# Patient Record
Sex: Female | Born: 1960
Health system: Southern US, Community
[De-identification: ages and names within clinical notes are randomized; demographics above are authoritative.]

## PROBLEM LIST (undated history)

## (undated) ENCOUNTER — Emergency Department (HOSPITAL_COMMUNITY): Admission: EM | Payer: Commercial Managed Care - HMO | Source: Home / Self Care

## (undated) DIAGNOSIS — E785 Hyperlipidemia, unspecified: Secondary | ICD-10-CM

## (undated) DIAGNOSIS — R42 Dizziness and giddiness: Secondary | ICD-10-CM

## (undated) DIAGNOSIS — D126 Benign neoplasm of colon, unspecified: Secondary | ICD-10-CM

## (undated) DIAGNOSIS — M1990 Unspecified osteoarthritis, unspecified site: Secondary | ICD-10-CM

## (undated) HISTORY — DX: Benign neoplasm of colon, unspecified: D12.6

## (undated) HISTORY — PX: OTHER SURGICAL HISTORY: SHX169

## (undated) HISTORY — PX: BREAST BIOPSY: SHX20

## (undated) HISTORY — PX: TUBAL LIGATION: SHX77

## (undated) HISTORY — DX: Hyperlipidemia, unspecified: E78.5

## (undated) HISTORY — PX: APPENDECTOMY: SHX54

## (undated) HISTORY — DX: Dizziness and giddiness: R42

## (undated) HISTORY — DX: Unspecified osteoarthritis, unspecified site: M19.90

---

## 1999-06-16 ENCOUNTER — Other Ambulatory Visit: Admission: RE | Admit: 1999-06-16 | Discharge: 1999-06-16 | Payer: Self-pay | Admitting: Obstetrics & Gynecology

## 1999-06-22 ENCOUNTER — Encounter: Payer: Self-pay | Admitting: Obstetrics & Gynecology

## 1999-06-22 ENCOUNTER — Encounter: Admission: RE | Admit: 1999-06-22 | Discharge: 1999-06-22 | Payer: Self-pay | Admitting: Obstetrics & Gynecology

## 1999-06-30 ENCOUNTER — Encounter: Admission: RE | Admit: 1999-06-30 | Discharge: 1999-06-30 | Payer: Self-pay | Admitting: Obstetrics & Gynecology

## 1999-06-30 ENCOUNTER — Encounter: Payer: Self-pay | Admitting: Obstetrics & Gynecology

## 2000-01-03 ENCOUNTER — Encounter: Admission: RE | Admit: 2000-01-03 | Discharge: 2000-01-03 | Payer: Self-pay | Admitting: Obstetrics & Gynecology

## 2000-01-03 ENCOUNTER — Encounter: Payer: Self-pay | Admitting: Obstetrics & Gynecology

## 2000-06-19 ENCOUNTER — Other Ambulatory Visit: Admission: RE | Admit: 2000-06-19 | Discharge: 2000-06-19 | Payer: Self-pay | Admitting: Obstetrics & Gynecology

## 2000-06-27 ENCOUNTER — Encounter: Payer: Self-pay | Admitting: Obstetrics & Gynecology

## 2000-06-27 ENCOUNTER — Encounter: Admission: RE | Admit: 2000-06-27 | Discharge: 2000-06-27 | Payer: Self-pay | Admitting: Obstetrics & Gynecology

## 2000-11-06 ENCOUNTER — Ambulatory Visit (HOSPITAL_COMMUNITY): Admission: RE | Admit: 2000-11-06 | Discharge: 2000-11-06 | Payer: Self-pay | Admitting: Internal Medicine

## 2000-11-06 ENCOUNTER — Encounter: Payer: Self-pay | Admitting: Internal Medicine

## 2001-06-25 ENCOUNTER — Other Ambulatory Visit: Admission: RE | Admit: 2001-06-25 | Discharge: 2001-06-25 | Payer: Self-pay | Admitting: Obstetrics & Gynecology

## 2001-06-28 ENCOUNTER — Encounter: Payer: Self-pay | Admitting: Obstetrics & Gynecology

## 2001-06-28 ENCOUNTER — Encounter: Admission: RE | Admit: 2001-06-28 | Discharge: 2001-06-28 | Payer: Self-pay | Admitting: Obstetrics & Gynecology

## 2002-06-27 ENCOUNTER — Other Ambulatory Visit: Admission: RE | Admit: 2002-06-27 | Discharge: 2002-06-27 | Payer: Self-pay | Admitting: Obstetrics & Gynecology

## 2002-07-09 ENCOUNTER — Encounter: Admission: RE | Admit: 2002-07-09 | Discharge: 2002-07-09 | Payer: Self-pay | Admitting: Obstetrics & Gynecology

## 2002-07-09 ENCOUNTER — Encounter: Payer: Self-pay | Admitting: Obstetrics & Gynecology

## 2003-06-24 ENCOUNTER — Other Ambulatory Visit: Admission: RE | Admit: 2003-06-24 | Discharge: 2003-06-24 | Payer: Self-pay | Admitting: Obstetrics & Gynecology

## 2003-07-21 ENCOUNTER — Encounter: Admission: RE | Admit: 2003-07-21 | Discharge: 2003-07-21 | Payer: Self-pay | Admitting: Obstetrics & Gynecology

## 2004-08-12 ENCOUNTER — Encounter: Admission: RE | Admit: 2004-08-12 | Discharge: 2004-08-12 | Payer: Self-pay | Admitting: Obstetrics & Gynecology

## 2005-03-21 ENCOUNTER — Ambulatory Visit: Payer: Self-pay | Admitting: Internal Medicine

## 2005-09-06 ENCOUNTER — Encounter: Admission: RE | Admit: 2005-09-06 | Discharge: 2005-09-06 | Payer: Self-pay | Admitting: Obstetrics & Gynecology

## 2005-09-20 ENCOUNTER — Encounter: Admission: RE | Admit: 2005-09-20 | Discharge: 2005-09-20 | Payer: Self-pay | Admitting: Obstetrics & Gynecology

## 2005-09-20 ENCOUNTER — Ambulatory Visit: Payer: Self-pay | Admitting: Internal Medicine

## 2005-09-28 ENCOUNTER — Encounter (INDEPENDENT_AMBULATORY_CARE_PROVIDER_SITE_OTHER): Payer: Self-pay | Admitting: Specialist

## 2005-09-28 ENCOUNTER — Encounter: Admission: RE | Admit: 2005-09-28 | Discharge: 2005-09-28 | Payer: Self-pay | Admitting: Obstetrics & Gynecology

## 2005-10-25 ENCOUNTER — Ambulatory Visit: Payer: Self-pay | Admitting: Internal Medicine

## 2005-11-16 ENCOUNTER — Encounter: Admission: RE | Admit: 2005-11-16 | Discharge: 2005-11-16 | Payer: Self-pay | Admitting: General Surgery

## 2005-11-16 ENCOUNTER — Ambulatory Visit (HOSPITAL_BASED_OUTPATIENT_CLINIC_OR_DEPARTMENT_OTHER): Admission: RE | Admit: 2005-11-16 | Discharge: 2005-11-16 | Payer: Self-pay | Admitting: General Surgery

## 2005-11-16 ENCOUNTER — Encounter (INDEPENDENT_AMBULATORY_CARE_PROVIDER_SITE_OTHER): Payer: Self-pay | Admitting: *Deleted

## 2006-09-11 ENCOUNTER — Encounter: Admission: RE | Admit: 2006-09-11 | Discharge: 2006-09-11 | Payer: Self-pay | Admitting: Obstetrics & Gynecology

## 2006-09-13 ENCOUNTER — Encounter: Admission: RE | Admit: 2006-09-13 | Discharge: 2006-09-13 | Payer: Self-pay | Admitting: Obstetrics & Gynecology

## 2007-09-27 ENCOUNTER — Encounter: Admission: RE | Admit: 2007-09-27 | Discharge: 2007-09-27 | Payer: Self-pay | Admitting: Obstetrics & Gynecology

## 2007-11-16 ENCOUNTER — Ambulatory Visit: Payer: Self-pay | Admitting: Internal Medicine

## 2007-12-06 ENCOUNTER — Ambulatory Visit: Payer: Self-pay | Admitting: Internal Medicine

## 2007-12-10 ENCOUNTER — Telehealth (INDEPENDENT_AMBULATORY_CARE_PROVIDER_SITE_OTHER): Payer: Self-pay | Admitting: *Deleted

## 2007-12-24 ENCOUNTER — Telehealth (INDEPENDENT_AMBULATORY_CARE_PROVIDER_SITE_OTHER): Payer: Self-pay | Admitting: *Deleted

## 2008-07-25 ENCOUNTER — Encounter: Admission: RE | Admit: 2008-07-25 | Discharge: 2008-07-25 | Payer: Self-pay | Admitting: Obstetrics & Gynecology

## 2008-10-10 ENCOUNTER — Encounter: Admission: RE | Admit: 2008-10-10 | Discharge: 2008-10-10 | Payer: Self-pay | Admitting: Obstetrics & Gynecology

## 2009-03-12 ENCOUNTER — Ambulatory Visit: Payer: Self-pay | Admitting: Internal Medicine

## 2009-03-12 DIAGNOSIS — E785 Hyperlipidemia, unspecified: Secondary | ICD-10-CM | POA: Insufficient documentation

## 2009-03-12 DIAGNOSIS — J309 Allergic rhinitis, unspecified: Secondary | ICD-10-CM | POA: Insufficient documentation

## 2009-03-16 ENCOUNTER — Encounter (INDEPENDENT_AMBULATORY_CARE_PROVIDER_SITE_OTHER): Payer: Self-pay | Admitting: *Deleted

## 2009-03-17 ENCOUNTER — Encounter: Payer: Self-pay | Admitting: Internal Medicine

## 2009-03-18 ENCOUNTER — Ambulatory Visit: Payer: Self-pay | Admitting: Gastroenterology

## 2009-03-30 ENCOUNTER — Ambulatory Visit: Payer: Self-pay | Admitting: Gastroenterology

## 2009-03-30 DIAGNOSIS — D126 Benign neoplasm of colon, unspecified: Secondary | ICD-10-CM

## 2009-03-30 HISTORY — DX: Benign neoplasm of colon, unspecified: D12.6

## 2009-03-30 HISTORY — PX: OTHER SURGICAL HISTORY: SHX169

## 2009-04-03 ENCOUNTER — Encounter: Payer: Self-pay | Admitting: Gastroenterology

## 2009-10-14 ENCOUNTER — Encounter: Admission: RE | Admit: 2009-10-14 | Discharge: 2009-10-14 | Payer: Self-pay | Admitting: Obstetrics & Gynecology

## 2010-02-16 ENCOUNTER — Telehealth (INDEPENDENT_AMBULATORY_CARE_PROVIDER_SITE_OTHER): Payer: Self-pay | Admitting: *Deleted

## 2010-03-21 ENCOUNTER — Encounter: Payer: Self-pay | Admitting: Obstetrics & Gynecology

## 2010-03-28 LAB — CONVERTED CEMR LAB
AST: 19 units/L (ref 0–37)
Albumin: 4 g/dL (ref 3.5–5.2)
Alkaline Phosphatase: 18 units/L — ABNORMAL LOW (ref 39–117)
Basophils Absolute: 0 10*3/uL (ref 0.0–0.1)
Bilirubin, Direct: 0.1 mg/dL (ref 0.0–0.3)
Cholesterol, target level: 200 mg/dL
Glucose, Bld: 94 mg/dL (ref 70–99)
Hemoglobin: 12.5 g/dL (ref 12.0–15.0)
Lymphocytes Relative: 28.8 % (ref 12.0–46.0)
MCV: 94 fL (ref 78.0–100.0)
Neutrophils Relative %: 58 % (ref 43.0–77.0)
RBC: 4.03 M/uL (ref 3.87–5.11)
Sodium: 136 meq/L (ref 135–145)
TSH: 2.49 microintl units/mL (ref 0.35–5.50)
Total Bilirubin: 0.7 mg/dL (ref 0.3–1.2)
Total Protein: 6.8 g/dL (ref 6.0–8.3)
WBC: 4 10*3/uL — ABNORMAL LOW (ref 4.5–10.5)

## 2010-03-30 NOTE — Letter (Signed)
Summary: Results Letter  [facility]  [address] [place], [place] [address]   Phone: [phone]  Fax: [phone]        April 03, 2009 MRN: [id number]    Jean Pittman [address] [place], [place]  [address]    Dear Ms. Fadeley,   At least one of the polyps removed during your recent procedure was proven to be adenomatous.  These are pre-cancerous polyps that may have grown into cancers if they had not been removed.  Based on current nationally recognized surveillance guidelines, I recommend that you have a repeat colonoscopy in 5 years.  We will therefore put your information in our reminder system and will contact you in 5 years to schedule a repeat procedure.  Please call if you have any questions or concerns.       Sincerely,  Rachael Fee MD  This letter has been electronically signed by your physician.  Appended Document: Results Letter Letter mailed 2.8.11

## 2010-03-30 NOTE — Letter (Signed)
Summary: Results Follow up Letter  Jean Pittman at [place]  [address] [place], [place] [address]   Phone: [phone]  Fax: [phone]    03/16/2009 MRN: [id number]  [facility] [address] [place], [place]  [address]  Dear Jean Pittman,  The following are the results of your recent test(s):  Test         Result    Pap Smear:        Normal _____  Not Normal _____ Comments: ______________________________________________________ Cholesterol: LDL(Bad cholesterol):         Your goal is less than:         HDL (Good cholesterol):       Your goal is more than: Comments:  ______________________________________________________ Mammogram:        Normal _____  Not Normal _____ Comments:  ___________________________________________________________________ Hemoccult:        Normal _____  Not normal _______ Comments:    _____________________________________________________________________ Other Tests:Please see attached labs.    We routinely do not discuss normal results over the telephone.  If you desire a copy of the results, or you have any questions about this information we can discuss them at your next office visit.   Sincerely,    Felecia, CMA

## 2010-03-30 NOTE — Letter (Signed)
Summary: [facility] Instructions  [facility]  [address] [place], [place] [address]   Phone: [phone]  Fax: [phone]       Jean Pittman    1961/02/21    MRN: [id number]        Procedure Day /Date:  Monday 03/02/2009     Arrival Time:  10:00 am      Procedure Time:  11:00 am     Location of Procedure:                    _x _  [facility] (4th Floor)                        PREPARATION FOR COLONOSCOPY WITH MOVIPREP   Starting 5 days prior to your procedure Wednesday 1/26 do not eat nuts, seeds, popcorn, corn, beans, peas,  salads, or any raw vegetables.  Do not take any fiber supplements (e.g. Metamucil, Citrucel, and Benefiber).  THE DAY BEFORE YOUR PROCEDURE         DATE: Sunday 1/30  1.  Drink clear liquids the entire day-NO SOLID FOOD  2.  Do not drink anything colored red or purple.  Avoid juices with pulp.  No orange juice.  3.  Drink at least 64 oz. (8 glasses) of fluid/clear liquids during the day to prevent dehydration and help the prep work efficiently.  CLEAR LIQUIDS INCLUDE: Water Jello Ice Popsicles Tea (sugar ok, no milk/cream) Powdered fruit flavored drinks Coffee (sugar ok, no milk/cream) Gatorade Juice: apple, white grape, white cranberry  Lemonade Clear bullion, consomm, broth Carbonated beverages (any kind) Strained chicken noodle soup Hard Candy                             4.  In the morning, mix first dose of MoviPrep solution:    Empty 1 Pouch A and 1 Pouch B into the disposable container    Add lukewarm drinking water to the top line of the container. Mix to dissolve    Refrigerate (mixed solution should be used within 24 hrs)  5.  Begin drinking the prep at 5:00 p.m. The MoviPrep container is divided by 4 marks.   Every 15 minutes drink the solution down to the next mark (approximately 8 oz) until the full liter is complete.   6.  Follow completed prep with 16 oz of clear liquid of your choice  (Nothing red or purple).  Continue to drink clear liquids until bedtime.  7.  Before going to bed, mix second dose of MoviPrep solution:    Empty 1 Pouch A and 1 Pouch B into the disposable container    Add lukewarm drinking water to the top line of the container. Mix to dissolve    Refrigerate  THE DAY OF YOUR PROCEDURE      DATE: Monday 1/31  Beginning at 6:00 am (5 hours before procedure):         1. Every 15 minutes, drink the solution down to the next mark (approx 8 oz) until the full liter is complete.  2. Follow completed prep with 16 oz. of clear liquid of your choice.    3. You may drink clear liquids until 9:00 am (2 HOURS BEFORE PROCEDURE).   MEDICATION INSTRUCTIONS  Unless otherwise instructed, you should take regular prescription medications with a small sip of water   as early as possible the  morning of your procedure.          OTHER INSTRUCTIONS  You will need a responsible adult at least 51 years of age to accompany you and drive you home.   This person must remain in the waiting room during your procedure.  Wear loose fitting clothing that is easily removed.  Leave jewelry and other valuables at home.  However, you may wish to bring a book to read or  an iPod/MP3 player to listen to music as you wait for your procedure to start.  Remove all body piercing jewelry and leave at home.  Total time from sign-in until discharge is approximately 2-3 hours.  You should go home directly after your procedure and rest.  You can resume normal activities the  day after your procedure.  The day of your procedure you should not:   Drive   Make legal decisions   Operate machinery   Drink alcohol   Return to work  You will receive specific instructions about eating, activities and medications before you leave.    The above instructions have been reviewed and explained to me by   Ezra Sites RN  March 18, 2009 10:51 AM     I fully understand and  can verbalize these instructions _____________________________ Date _________

## 2010-03-30 NOTE — Procedures (Signed)
Summary: Colonoscopy  Patient: [name] Note: All result statuses are Final unless otherwise noted.  Tests: (1) Colonoscopy (COL)   COL Colonoscopy           DONE     [facility]     [address]     [place], [place]  [address]           COLONOSCOPY PROCEDURE REPORT           PATIENT:  Jean, Pittman  MR#:  [id number]     BIRTHDATE:  1960-07-05, 49 yrs. old  GENDER:  female           ENDOSCOPIST:  Rachael Fee, MD     Referred by:  Marga Melnick, M.D.           PROCEDURE DATE:  03/30/2009     PROCEDURE:  Colonoscopy with snare polypectomy     ASA CLASS:  Class II     INDICATIONS:  Routine Risk Screening           MEDICATIONS:   Fentanyl 100 mcg IV, Versed 10 mg IV           DESCRIPTION OF PROCEDURE:   After the risks benefits and     alternatives of the procedure were thoroughly explained, informed     consent was obtained.  Digital rectal exam was performed and     revealed no rectal masses.   The LB CF-[id number] endoscope     was introduced through the anus and advanced to the cecum, which     was identified by both the appendix and ileocecal valve, without     limitations.  The quality of the prep was good, using MoviPrep.     The instrument was then slowly withdrawn as the colon was fully     examined.     <<PROCEDUREIMAGES>>           FINDINGS:  A diminutive polyp was found in the proximal transverse     colon. This was 2-73mm across, removed with cold snare and sent to     pathology (ajr 1) (see image5 and image6).  Internal and external     hemorrhoids were found. These were small.  This was otherwise a     normal examination of the colon (see image3, image2, and image7).     Retroflexed views in the rectum revealed no abnormalities.    The     scope was then withdrawn from the patient and the procedure     completed.           COMPLICATIONS:  None           ENDOSCOPIC IMPRESSION:     1) Diminutive polyp in the proximal transverse colon,  removed     and sent to pathology     2) Small internal and external hemorrhoids     3) Otherwise normal examination           RECOMMENDATIONS:     1) If the polyp(s) removed today are proven to be adenomatous     (pre-cancerous) polyps, you will need a repeat colonoscopy in 5     years. Otherwise you should continue to follow colorectal cancer     screening guidelines for "routine risk" patients with colonoscopy     in 10 years.     2) You will receive a letter within 1-2 weeks with the results     of your biopsy as well  as final recommendations. Please call my     office if you have not received a letter after 3 weeks.           REPEAT EXAM:  await pathology           ______________________________     Rachael Fee, MD           CC:           n.     eSIGNED:   Rachael Fee at 03/30/2009 11:13 AM           Romualdo Bolk, [id number]  Note: An exclamation mark (!) indicates a result that was not dispersed into the flowsheet. Document Creation Date: 03/30/2009 11:14 AM _______________________________________________________________________  (1) Order result status: Final Collection or observation date-time: 03/30/2009 11:10 Requested date-time:  Receipt date-time:  Reported date-time:  Referring Physician:   Ordering Physician: Rob Bunting [phone]) Specimen Source:  Source: Launa Grill Order Number: [id number] Lab site:   Appended Document: Colonoscopy recall     Procedures Next Due Date:    Colonoscopy: 02/2014

## 2010-03-30 NOTE — Assessment & Plan Note (Signed)
Summary: PT NEEDS BLOODWORK-DECLINES CPX/NTA   Vital Signs:  Patient profile:   50 year old female Height:      66.75 inches Weight:      146.2 pounds BMI:     23.15 Pulse rate:   56 / minute Resp:     12 per minute BP sitting:   118 / 70  (left arm) Cuff size:   regular  Vitals Entered By: Shonna Chock (March 12, 2009 9:06 AM)  Comments No current meds   History of Present Illness: Jean Pittman requests fasting labs; her lipids were borderline elevated while on OsteoBiflex in 2007. She is asymptomatic.  Lipid Management History:      Negative NCEP/ATP III risk factors include female age less than 75 years old, no history of early menopause without estrogen hormone replacement, non-diabetic, no family history for ischemic heart disease, non-tobacco-user status, non-hypertensive, no ASHD (atherosclerotic heart disease), no prior stroke/TIA, no peripheral vascular disease, and no history of aortic aneurysm.     Preventive Screening-Counseling & Management  Alcohol-Tobacco     Smoking Status: never  Caffeine-Diet-Exercise     Does Patient Exercise: no  Allergies (verified): No Known Drug Allergies  Past History:  Past Medical History: 8/07 - abnormal mammogram, (neg bx) Shoulder Impingement 2010, S/P steroid injection & PT Hyperlipidemia , borderline on Glucosamine  Past Surgical History: Appendectomy (1914) Caesarean section (7829) Caesarean section (5621) Caesarean section (3086) Tubal ligation (1987); G 3 P 3 Breast biopsy 2007 ; No colonoscopy to date ( SOC reviewed)  Family History: no colon CA, CAD,CVA, breast CA DM, CA ? primary  - P aunt HTN - F Alzheimer's  - P aunt Thyroid disease -M  Social History: Married [place]  Never Smoked Alcohol use-yes: rare Regular exercise-no Does Patient Exercise:  no  Review of Systems  The patient denies fever, weight loss, weight gain, vision loss, decreased hearing, hoarseness, chest pain,  syncope, dyspnea on exertion, peripheral edema, headaches, hematuria, incontinence, suspicious skin lesions, depression, unusual weight change, abnormal bleeding, enlarged lymph nodes, and angioedema.         Minor appetite change with URI ENT:  Complains of sinus pressure; denies nasal congestion; L maxillary pressure.No frontal headache or purulence.OTC Zyrtec D as needed . Resp:  Denies cough, shortness of breath, sputum productive, and wheezing. GI:  Denies abdominal pain, bloody stools, dark tarry stools, and indigestion; Align as needed for constipation. MS:  Complains of joint pain; denies joint redness, joint swelling, low back pain, mid back pain, and thoracic pain. Allergy:  Denies itching eyes, seasonal allergies, and sneezing.  Physical Exam  General:  Thin but well-nourished,in no acute distress; alert,appropriate and cooperative throughout examination Head:  Normocephalic and atraumatic without obvious abnormalities.  Eyes:  No corneal or conjunctival inflammation noted.Perrla. Funduscopic exam benign, without hemorrhages, exudates or papilledema.  Ears:  External ear exam shows no significant lesions or deformities.  Otoscopic examination reveals clear canals, tympanic membranes are intact bilaterally without bulging, retraction, inflammation or discharge. Hearing is grossly normal bilaterally. Nose:  External nasal examination shows no deformity or inflammation. Nasal mucosa are pink and moist without lesions or exudates. Slightly decreased orifice on L Mouth:  Oral mucosa and oropharynx without lesions or exudates.  Teeth in good repair. Neck:  No deformities, masses, or tenderness noted. Lungs:  Normal respiratory effort, chest expands symmetrically. Lungs are clear to auscultation, no crackles or wheezes. Heart:  Normal rate and regular rhythm. S1 and S2 normal without gallop, murmur, click,  rub . Soft S4  Abdomen:  Bowel sounds positive,abdomen soft and non-tender without  masses, organomegaly or hernias noted. Genitalia:  Dr Seymour Bars Pulses:  R and L carotid,radial,dorsalis pedis and posterior tibial pulses are full and equal bilaterally Extremities:  No clubbing, cyanosis, edema, or deformity noted with normal full range of motion of all joints.   Neurologic:  alert & oriented X3 and DTRs symmetrical and normal.   Skin:  Intact without suspicious lesions or rashes Cervical Nodes:  No lymphadenopathy noted Axillary Nodes:  No palpable lymphadenopathy Psych:  memory intact for recent and remote, normally interactive, and good eye contact.     Impression & Recommendations:  Problem # 1:  PREVENTIVE HEALTH CARE (ICD-V70.0)  Orders: Venipuncture (59563) TLB-BMP (Basic Metabolic Panel-BMET) (80048-METABOL) TLB-CBC Platelet - w/Differential (85025-CBCD) TLB-Hepatic/Liver Function Pnl (80076-HEPATIC) TLB-TSH (Thyroid Stimulating Hormone) (84443-TSH) T-NMR, Lipoprofile (87564-33295) EKG w/ Interpretation (93000) Gastroenterology Referral (GI)  Problem # 2:  RHINITIS (ICD-477.9)  L maxillary pressure  Her updated medication list for this problem includes:    Fluticasone Propionate 50 Mcg/act Susp (Fluticasone propionate) .Marland Kitchen... 1 spray two times a day as needed for sinus pressure  Problem # 3:  HYPERLIPIDEMIA (ICD-272.4)  borderline elevation on Glucosamine  Orders: Venipuncture (18841) T-NMR, Lipoprofile (66063-01601) EKG w/ Interpretation (93000)  Problem # 4:  SCREENING, COLON CANCER (ICD-V76.51)  Orders: Gastroenterology Referral (GI)  Complete Medication List: 1)  Fluticasone Propionate 50 Mcg/act Susp (Fluticasone propionate) .Marland Kitchen.. 1 spray two times a day as needed for sinus pressure  Lipid Assessment/Plan:      Based on NCEP/ATP III, the patient's risk factor category is "0-1 risk factors".  The patient's lipid goals are as follows: Total cholesterol goal is 200; LDL cholesterol goal is 160; HDL cholesterol goal is 40; Triglyceride goal is  150.  Her LDL cholesterol goal has been met.    Patient Instructions: 1)  Neti pot once daily as needed for sinus pressure. Prescriptions: FLUTICASONE PROPIONATE 50 MCG/ACT SUSP (FLUTICASONE PROPIONATE) 1 spray two times a day as needed for sinus pressure  #1 x 11   Entered and Authorized by:   Marga Melnick MD   Signed by:   Marga Melnick MD on 03/12/2009   Method used:   Print then Give to Patient   RxID:   [id number]

## 2010-03-30 NOTE — Miscellaneous (Signed)
Summary: [facility]  Clinical Lists Changes  Observations: Added new observation of NKA: T (03/18/2009 10:16)  Pt's pharmacy ([facility]) is not in EMR.  Rx for Moviprep called in.

## 2010-04-01 NOTE — Progress Notes (Signed)
Summary: labs orders for April 18, 2010--added orders  Phone Note Call from Patient   Caller: Patient Summary of Call: has CPX scheduled for 04/16/2010----has labs for 04-18-2010     what codes and diag do you want??    thanks Initial call taken by: Jerolyn Shin,  February 16, 2010 9:28 AM  Follow-up for Phone Call        Lipid,Hepatic,BMP,CBCD,TSH,Stool cards v70.0/272.4 Follow-up by: Shonna Chock CMA,  February 16, 2010 1:24 PM  Additional Follow-up for Phone Call Additional follow up Details #1::        added orders to 2010-04-18 labs Additional Follow-up by: Jerolyn Shin,  February 17, 2010 9:04 AM

## 2010-04-09 ENCOUNTER — Other Ambulatory Visit (INDEPENDENT_AMBULATORY_CARE_PROVIDER_SITE_OTHER): Payer: BC Managed Care – PPO

## 2010-04-09 ENCOUNTER — Other Ambulatory Visit: Payer: Self-pay | Admitting: Internal Medicine

## 2010-04-09 ENCOUNTER — Encounter (INDEPENDENT_AMBULATORY_CARE_PROVIDER_SITE_OTHER): Payer: Self-pay | Admitting: *Deleted

## 2010-04-09 DIAGNOSIS — Z0000 Encounter for general adult medical examination without abnormal findings: Secondary | ICD-10-CM

## 2010-04-09 DIAGNOSIS — E785 Hyperlipidemia, unspecified: Secondary | ICD-10-CM

## 2010-04-09 LAB — LDL CHOLESTEROL, DIRECT: Direct LDL: 128.5 mg/dL

## 2010-04-09 LAB — CBC WITH DIFFERENTIAL/PLATELET
Basophils Absolute: 0 10*3/uL (ref 0.0–0.1)
Basophils Relative: 0.7 % (ref 0.0–3.0)
Eosinophils Absolute: 0.1 10*3/uL (ref 0.0–0.7)
HCT: 36.3 % (ref 36.0–46.0)
MCHC: 34.9 g/dL (ref 30.0–36.0)
MCV: 93.3 fl (ref 78.0–100.0)
Neutro Abs: 2.9 10*3/uL (ref 1.4–7.7)
Neutrophils Relative %: 61.2 % (ref 43.0–77.0)
RBC: 3.89 Mil/uL (ref 3.87–5.11)
WBC: 4.7 10*3/uL (ref 4.5–10.5)

## 2010-04-09 LAB — BASIC METABOLIC PANEL
Creatinine, Ser: 1 mg/dL (ref 0.4–1.2)
Glucose, Bld: 83 mg/dL (ref 70–99)

## 2010-04-09 LAB — HEPATIC FUNCTION PANEL
AST: 26 U/L (ref 0–37)
Alkaline Phosphatase: 19 U/L — ABNORMAL LOW (ref 39–117)
Bilirubin, Direct: 0.1 mg/dL (ref 0.0–0.3)
Total Bilirubin: 0.9 mg/dL (ref 0.3–1.2)
Total Protein: 6.1 g/dL (ref 6.0–8.3)

## 2010-04-09 LAB — TSH: TSH: 2.2 u[IU]/mL (ref 0.35–5.50)

## 2010-04-09 LAB — LIPID PANEL
Cholesterol: 210 mg/dL — ABNORMAL HIGH (ref 0–200)
Total CHOL/HDL Ratio: 3
Triglycerides: 93 mg/dL (ref 0.0–149.0)

## 2010-04-16 ENCOUNTER — Encounter (INDEPENDENT_AMBULATORY_CARE_PROVIDER_SITE_OTHER): Payer: BC Managed Care – PPO | Admitting: Internal Medicine

## 2010-04-16 ENCOUNTER — Encounter: Payer: Self-pay | Admitting: Internal Medicine

## 2010-04-16 DIAGNOSIS — J45909 Unspecified asthma, uncomplicated: Secondary | ICD-10-CM | POA: Insufficient documentation

## 2010-04-16 DIAGNOSIS — Z23 Encounter for immunization: Secondary | ICD-10-CM

## 2010-04-16 DIAGNOSIS — E785 Hyperlipidemia, unspecified: Secondary | ICD-10-CM

## 2010-04-16 DIAGNOSIS — Z0000 Encounter for general adult medical examination without abnormal findings: Secondary | ICD-10-CM

## 2010-04-16 DIAGNOSIS — D126 Benign neoplasm of colon, unspecified: Secondary | ICD-10-CM

## 2010-04-21 NOTE — Assessment & Plan Note (Signed)
Summary: cpx/kn   Vital Signs:  Patient profile:   50 year old female Height:      66.5 inches Weight:      150 pounds BMI:     23.93 Temp:     98.5 degrees F oral Pulse rate:   67 / minute Resp:     14 per minute BP sitting:   110 / 70  (left arm) Cuff size:   regular  Vitals Entered By: Shonna Chock CMA (April 16, 2010 1:19 PM) CC: CPX, discuss labs (copy given) , COPD follow-up, Lipid Management   CC:  CPX, discuss labs (copy given) , COPD follow-up, and Lipid Management.  History of Present Illness:    Jean Pittman is here for a physical ; she has had recurrent episodes of bronchitis w/o  definite  etilogy or triggers by history. She  denies shortness of breath, chest tightness, wheezing, increased sputum, nocturnal awakening, and exercise induced symptoms.   Cold exposure caused " throat muscle weakness" in 03/2010 while skiing. When running in cold ; " I feel tightness".  Lipid Management History:      Negative NCEP/ATP III risk factors include female age less than 87 years old, no history of early menopause without estrogen hormone replacement, non-diabetic, HDL cholesterol greater than 60, no family history for ischemic heart disease, non-tobacco-user status, non-hypertensive, no ASHD (atherosclerotic heart disease), no prior stroke/TIA, no peripheral vascular disease, and no history of aortic aneurysm.     Preventive Screening-Counseling & Management  Caffeine-Diet-Exercise     Does Patient Exercise: yes  Current Medications (verified): 1)  Fluticasone Propionate 50 Mcg/act Susp (Fluticasone Propionate) .Marland Kitchen.. 1 Spray Two Times A Day As Needed For Sinus Pressure 2)  Cinnamon 500 Mg Caps (Cinnamon) .Marland Kitchen.. 1 By Mouth Once Daily 3)  Magnesium Oxide 400 Mg Tabs (Magnesium Oxide) .Marland Kitchen.. 1 By Mouth Once Daily 4)  Fish Oil 1000 Mg Caps (Omega-3 Fatty Acids) .Marland Kitchen.. 1 By Mouth Once Daily 5)  Omega-3 Cf 1000 Mg Caps (Omega-3 Fatty Acids) .Marland Kitchen.. 1 By Mouth Once Daily 6)  Multivitamins   Tabs (Multiple Vitamin) .Marland Kitchen.. 1 By Mouth Once Daily 7)  Calcium 630 Vit D500 .Marland Kitchen.. 1 By Mouth Once Daily 8)  Alpha-Lipoic Acid 200 Mg Caps (Alpha-Lipoic Acid) .Marland Kitchen.. 1 By Mouth Once Daily  Allergies (verified): No Known Drug Allergies  Past History:  Past Medical History: Abnormal mammogram 2007, negative biopsy Shoulder Impingement 2010, S/P steroid injection & PT Hyperlipidemia , borderline on Glucosamine: Framingham Study LDL goal = < 160.  Past Surgical History: Appendectomy (1982) Caesarean section (1982, 1985, & 1987 Tubal ligation (1987); G 3 P 3 Breast biopsy 2007  Colon polypectomy, tubular adenoma  02/2009, Dr Christella Hartigan  Family History: no colon  cancer , CAD,CVA, breast  cancer  P  aunt :DM, cancer  ? primary F:HTN P aunt:Alzheimer's  M: Thyroid disease (hypothyroidism)  Social History: [place]ried [place]  Never Smoked Alcohol use-yes: rarely  Regular exercise-yes : 3X /week X 60 min  Does Patient Exercise:  yes  Review of Systems  The patient denies anorexia, fever, weight loss, weight gain, vision loss, decreased hearing, hoarseness, chest pain, syncope, dyspnea on exertion, peripheral edema, prolonged cough, headaches, hemoptysis, abdominal pain, melena, hematochezia, severe indigestion/heartburn, hematuria, suspicious skin lesions, depression, abnormal bleeding, enlarged lymph nodes, and angioedema.    Physical Exam  General:  well-nourished;alert,appropriate and cooperative throughout examination Head:  Normocephalic and atraumatic without obvious abnormalities. . Eyes:  No corneal or conjunctival inflammation noted.  Perrla. Funduscopic exam benign, without hemorrhages, exudates or papilledema.  Ears:  External ear exam shows no significant lesions or deformities.  Otoscopic examination reveals clear canals, tympanic membranes are intact bilaterally without bulging, retraction, inflammation or discharge. Hearing is grossly normal  bilaterally. Nose:  External nasal examination shows no deformity or inflammation. Nasal mucosa are pink and moist without lesions or exudates. Mouth:  Oral mucosa and oropharynx without lesions or exudates.  Teeth in good repair. Neck:  No deformities, masses, or tenderness noted. Lungs:  Normal respiratory effort, chest expands symmetrically. Lungs are clear to auscultation, no crackles or wheezes. Heart:  regular rhythm, no murmur, no gallop, no rub, no JVD, no HJR, and bradycardia.   Abdomen:  Bowel sounds positive,abdomen soft and non-tender without masses, organomegaly or hernias noted. Genitalia:  Dr Jean Pittman Msk:  No deformity or scoliosis noted of thoracic or lumbar spine.   Pulses:  R and L carotid,radial,dorsalis pedis and posterior tibial pulses are full and equal bilaterally Extremities:  No clubbing, cyanosis, edema, or deformity noted with normal full range of motion of all joints.   Neurologic:  alert & oriented X3 and DTRs symmetrical and normal.   Skin:  Intact without suspicious lesions or rashes Cervical Nodes:  No lymphadenopathy noted Axillary Nodes:  No palpable lymphadenopathy Psych:  memory intact for recent and remote, normally interactive, and good eye contact.     Impression & Recommendations:  Problem # 1:  ROUTINE GENERAL MEDICAL EXAM@HEALTH  CARE FACL (ICD-V70.0)  Orders: EKG w/ Interpretation (93000)  Problem # 2:  REACTIVE AIRWAY DISEASE (ICD-493.90)  Cold induced & probably infection induced  symptoms  Her updated medication list for this problem includes:    Ventolin Hfa 108 (90 Base) Mcg/act Aers (Albuterol sulfate) .Marland Kitchen... 1-2 puffs pre cold exposure & with cough  Problem # 3:  ADENOMATOUS COLONIC POLYP (ICD-211.3)  Complete Medication List: 1)  Fluticasone Propionate 50 Mcg/act Susp (Fluticasone propionate) .Marland Kitchen.. 1 spray two times a day as needed for sinus pressure 2)  Cinnamon 500 Mg Caps (Cinnamon) .Marland Kitchen.. 1 by mouth once daily 3)  Magnesium Oxide  400 Mg Tabs (Magnesium oxide) .Marland Kitchen.. 1 by mouth once daily 4)  Fish Oil 1000 Mg Caps (Omega-3 fatty acids) .Marland Kitchen.. 1 by mouth once daily 5)  Omega-3 Cf 1000 Mg Caps (Omega-3 fatty acids) .Marland Kitchen.. 1 by mouth once daily 6)  Multivitamins Tabs (Multiple vitamin) .Marland Kitchen.. 1 by mouth once daily 7)  Calcium 630 Vit D500  .Marland KitchenMarland Kitchen. 1 by mouth once daily 8)  Alpha-lipoic Acid 200 Mg Caps (Alpha-lipoic acid) .Marland Kitchen.. 1 by mouth once daily 9)  Ventolin Hfa 108 (90 Base) Mcg/act Aers (Albuterol sulfate) .Marland Kitchen.. 1-2 puffs pre cold exposure & with cough  Lipid Assessment/Plan:      Based on NCEP/ATP III, the patient's risk factor category is "0-1 risk factors".  The patient's lipid goals are as follows: Total cholesterol goal is 200; LDL cholesterol goal is 160; HDL cholesterol goal is 40; Triglyceride goal is 150.  Her LDL cholesterol goal has been met.    Patient Instructions: 1)  Use Albuterol  MDI  as Rxed. Prescriptions: VENTOLIN HFA 108 (90 BASE) MCG/ACT AERS (ALBUTEROL SULFATE) 1-2 puffs pre cold exposure & with cough  #1 x 2   Entered and Authorized by:   Marga Melnick MD   Signed by:   Marga Melnick MD on 04/16/2010   Method used:   Print then Give to Patient   RxID:   [id number]    Orders  Added: 1)  Est. Patient 40-64 years [99396] 2)  EKG w/ Interpretation [93000]   Immunization History:  Tetanus/Td Immunization History:    Tetanus/Td:  tdap (07/29/2009)   Immunization History:  Tetanus/Td Immunization History:    Tetanus/Td:  Tdap (07/29/2009)  Appended Document: cpx/kn   Immunizations Administered:  Pneumonia Vaccine:    Vaccine Type: Pneumovax    Site: left deltoid    Mfr: Merck    Dose: 0.5 ml    Route: IM    Given by: Shonna Chock CMA    Exp. Date: 06/30/2011    Lot #: [id number]    VIS given: 02/02/09 version given April 16, 2010.

## 2010-04-27 NOTE — Letter (Signed)
Summary: [employer] Personalized Risk Profile  [employer] Personalized Risk Profile   Imported By: Maryln Gottron 04/21/2010 13:57:01  _____________________________________________________________________  External Attachment:    Type:   Image     Comment:   External Document

## 2010-07-16 NOTE — Op Note (Signed)
[name], Jean Pittman                ACCOUNT NO.:  [id number]   MEDICAL RECORD NO.:  [id number]          PATIENT TYPE:  AMB   LOCATION:  [facility]                          FACILITY:  [facility]   PHYSICIAN:  Ollen Gross. Vernell Morgans, M.D. DATE OF BIRTH:  11-01-1960   DATE OF PROCEDURE:  11/16/2005  DATE OF DISCHARGE:  11/16/2005                                 OPERATIVE REPORT   PREOPERATIVE DIAGNOSIS:  Left breast abnormal mammogram.   POSTOPERATIVE DIAGNOSIS:  Left breast abnormal mammogram.   PROCEDURES:  Left breast needle-localized lumpectomy.   SURGEON:  Dr. Chevis Pretty.   ANESTHESIA:  General via LMA.   DESCRIPTION OF PROCEDURE:  After informed consent was obtained, the patient  was brought to the operating room, placed in a supine position on the  operating room table.  After adequate induction of general anesthesia, the  patient's left breast was prepped with Betadine and draped in the usual  sterile manner. Early in the day, the patient had undergone a needle  localization procedure to localize the abnormality. A small curvilinear  incision was made near the wire.  This incision was carried down through the  skin and subcutaneous tissue sharply with electrocautery.  The path of the  wire was determined and a small circular portion of breast tissue was  removed around the path of the wire. This was done sharply with  electrocautery.  Once it was removed, it was oriented with a long stitch  being lateral and short stitch being superior and sent to pathology and  radiology for further evaluation.  Hemostasis was achieved using the Bovie  electrocautery. The incision was then closed with a deep layer of  interrupted 3-0 Vicryl stitches and the skin was closed with a running 4-0  Monocryl subcuticular stitch.  Benzoin, Steri-Strips and sterile dressings  were applied.  The patient tolerated the procedure well. At the end of the  case, all needle, sponge and instruments counts were correct.  The  patient  was then awakened and taken to recovery in stable condition.      Ollen Gross. Vernell Morgans, M.D.  Electronically Signed     PST/MEDQ  D:  11/20/2005  T:  11/22/2005  Job:  [id number]

## 2010-08-16 ENCOUNTER — Encounter: Payer: Self-pay | Admitting: Internal Medicine

## 2010-08-16 ENCOUNTER — Ambulatory Visit (INDEPENDENT_AMBULATORY_CARE_PROVIDER_SITE_OTHER): Payer: BC Managed Care – PPO | Admitting: Internal Medicine

## 2010-08-16 DIAGNOSIS — M2550 Pain in unspecified joint: Secondary | ICD-10-CM

## 2010-08-16 DIAGNOSIS — R05 Cough: Secondary | ICD-10-CM

## 2010-08-16 DIAGNOSIS — R197 Diarrhea, unspecified: Secondary | ICD-10-CM

## 2010-08-16 DIAGNOSIS — R059 Cough, unspecified: Secondary | ICD-10-CM

## 2010-08-16 LAB — HEPATIC FUNCTION PANEL
ALT: 16 U/L (ref 0–35)
Bilirubin, Direct: 0.1 mg/dL (ref 0.0–0.3)
Total Bilirubin: 0.3 mg/dL (ref 0.3–1.2)

## 2010-08-16 LAB — CBC WITH DIFFERENTIAL/PLATELET
Basophils Absolute: 0 10*3/uL (ref 0.0–0.1)
Eosinophils Absolute: 0 10*3/uL (ref 0.0–0.7)
HCT: 36.2 % (ref 36.0–46.0)
Lymphs Abs: 1 10*3/uL (ref 0.7–4.0)
MCHC: 34.7 g/dL (ref 30.0–36.0)
MCV: 91.7 fl (ref 78.0–100.0)
Monocytes Absolute: 0.5 10*3/uL (ref 0.1–1.0)
Neutrophils Relative %: 57.8 % (ref 43.0–77.0)
Platelets: 210 10*3/uL (ref 150.0–400.0)
RDW: 13.9 % (ref 11.5–14.6)
WBC: 3.9 10*3/uL — ABNORMAL LOW (ref 4.5–10.5)

## 2010-08-16 LAB — BASIC METABOLIC PANEL
BUN: 10 mg/dL (ref 6–23)
Chloride: 97 mEq/L (ref 96–112)
Creatinine, Ser: 0.8 mg/dL (ref 0.4–1.2)
Glucose, Bld: 93 mg/dL (ref 70–99)
Potassium: 3.7 mEq/L (ref 3.5–5.1)

## 2010-08-16 NOTE — Patient Instructions (Addendum)
Stay on clear liquids for 48-72 hours or until bowels are normal.This would include  jello, sherbert (NOT ice cream), Lipton's chicken noodle soup(NOT cream based soups),Gatorade Lite, flat Ginger ale (without High Fructose Corn Syrup),dry toast or crackers, baked potato.No milk , dairy or grease until bowels are formed. Align , a [employer] , daily if stools are loose. Immodium AD for frankly watery stool. Report increasing pain, fever or rectal bleeding  Do not drink from well. Call if you change your mind about needing  pain meds. Complete stool cards

## 2010-08-16 NOTE — Progress Notes (Signed)
  Subjective:    Patient ID: Jean Pittman, female    DOB: 18-Oct-1960, 50 y.o.   MRN: [id number]  HPI NAUSEA,  DIARRHEA Onset: 6/12 as arthralgias diffusely & fatigue upon awakening   Time course: diarrhea began 6/13   Description: watery initially, loose as of today   Number of stools per day: up to 3/day  Previous treatment: yes, Immodium AD X 3 days with benefet  Vomiting: no  Abdominal Pain: yes, cramping  Weight loss: yes, 5# in past 6 days  Decreased urine output: yes  Lightheadedness: yes  Recent travel history: no    Sick contacts: no    Suspicious food or water: no  Change in diet: no Red Flags Fever: yes, up to 100.5  five days ago  Bloody stools: no  Recent antibiotics: no  Water source: well Immuncompromised: no        Review of Systems Cough Onset:6/13  Infectious symptoms  purulent secretions :no Chest symptoms: pain, sputum production, hemoptysis,dyspnea,wheezing:no Occupational/environmental exposures:no Smoking:never ACE inhibitor:no Treatment/efficacy:none Past medical history/family history pulmonary disease: bronchitis X 5 in 2 years     Objective:   Physical Exam Gen.: Healthy and well-nourished in appearance. Alert, appropriate and cooperative throughout exam. Appears tired Eyes: No corneal or conjunctival inflammation noted.No icterus  Ears: External  ear exam reveals no significant lesions or deformities. Canals clear .TMs normal. Hearing is grossly normal bilaterally. Nose: External nasal exam reveals no deformity or inflammation. Nasal mucosa are pink and moist. No lesions or exudates noted. Septum  Minimally dislocated to L  Mouth: Oral mucosa and oropharynx reveal no lesions or exudates. Teeth in good repair.tongue moist Neck: No deformities, masses, or tenderness noted. Range of motion normal.  Lungs: Normal respiratory effort; chest expands symmetrically. Lungs are clear to auscultation without rales, wheezes, or increased work of  breathing. Heart: Normal rate and rhythm. Normal S1 and S2. No gallop, click, or rub. No murmur. Abdomen: Bowel sounds normal; abdomen soft  But slightly tender diffusely. No masses, organomegaly or hernias noted. No clubbing, cyanosis, edema, or deformity noted.  Nail health  good. Vascular: Carotid, radial artery, dorsalis pedis and dorsalis posterior tibial pulses are full and equal. No bruits present. Neurologic: Alert and oriented x3.   Skin: Intact without suspicious lesions or rashes.No tenting or jaundice Lymph: No cervical or  axillary lymphadenopathy present. Psych: Mood and affect are normal. Normally interactive                                                                                         Assessment & Plan:  #1 diarrhea, improving #2cough, NP #3 arthralgias Plan: see Orders

## 2010-09-15 ENCOUNTER — Other Ambulatory Visit: Payer: Self-pay | Admitting: Obstetrics & Gynecology

## 2010-09-15 DIAGNOSIS — Z1231 Encounter for screening mammogram for malignant neoplasm of breast: Secondary | ICD-10-CM

## 2010-10-18 ENCOUNTER — Ambulatory Visit: Payer: BC Managed Care – PPO

## 2010-10-20 ENCOUNTER — Ambulatory Visit
Admission: RE | Admit: 2010-10-20 | Discharge: 2010-10-20 | Disposition: A | Discharge status: Home / Self Care | Payer: BC Managed Care – PPO | Source: Ambulatory Visit | Attending: Obstetrics & Gynecology | Admitting: Obstetrics & Gynecology

## 2010-10-20 DIAGNOSIS — Z1231 Encounter for screening mammogram for malignant neoplasm of breast: Secondary | ICD-10-CM

## 2010-10-27 ENCOUNTER — Ambulatory Visit: Payer: BC Managed Care – PPO

## 2011-09-15 ENCOUNTER — Other Ambulatory Visit: Payer: Self-pay | Admitting: Obstetrics & Gynecology

## 2011-09-15 DIAGNOSIS — Z1231 Encounter for screening mammogram for malignant neoplasm of breast: Secondary | ICD-10-CM

## 2011-10-21 ENCOUNTER — Ambulatory Visit
Admission: RE | Admit: 2011-10-21 | Discharge: 2011-10-21 | Disposition: A | Discharge status: Home / Self Care | Payer: BC Managed Care – PPO | Source: Ambulatory Visit | Attending: Obstetrics & Gynecology | Admitting: Obstetrics & Gynecology

## 2011-10-21 DIAGNOSIS — Z1231 Encounter for screening mammogram for malignant neoplasm of breast: Secondary | ICD-10-CM

## 2012-02-15 ENCOUNTER — Ambulatory Visit (INDEPENDENT_AMBULATORY_CARE_PROVIDER_SITE_OTHER): Payer: BC Managed Care – PPO | Admitting: Internal Medicine

## 2012-02-15 ENCOUNTER — Encounter: Payer: Self-pay | Admitting: Internal Medicine

## 2012-02-15 VITALS — BP 128/72 | HR 74 | Temp 97.5°F | Resp 12 | Ht 66.5 in | Wt 158.8 lb

## 2012-02-15 DIAGNOSIS — Z0000 Encounter for general adult medical examination without abnormal findings: Secondary | ICD-10-CM

## 2012-02-15 DIAGNOSIS — R9431 Abnormal electrocardiogram [ECG] [EKG]: Secondary | ICD-10-CM | POA: Insufficient documentation

## 2012-02-15 NOTE — Progress Notes (Signed)
  Subjective:    Patient ID: Jean Pittman, female    DOB: April 21, 1960, 51 y.o.   MRN: [id number]  HPI Mrs. Scruggs is here for a physical; she denies acute issues .      Review of Systems  She is performing cardiovascular exercise and extremely high level V days a week.  She walks 4.5 miles in 75 minutes.She is on a heart healthy diet. She denies chest pain, palpitations, dyspnea, or claudication despite high level of exercise  There is no family history of premature coronary disease. Advanced cholesterol testing reveals her LDL goal is less than 115     Objective:   Physical Exam Gen.: Healthy and well-nourished in appearance. Alert, appropriate and cooperative throughout exam. Head: Normocephalic without obvious abnormalities  Eyes: No corneal or conjunctival inflammation noted. Pupils equal round reactive to light and accommodation. Fundal exam is benign without hemorrhages, exudate, papilledema. Extraocular motion intact. Vision grossly normal. Ears: External  ear exam reveals no significant lesions or deformities. Canals clear .TMs normal. Hearing is grossly normal bilaterally. Nose: External nasal exam reveals no deformity or inflammation. Nasal mucosa are pink and moist. No lesions or exudates noted.   Mouth: Oral mucosa and oropharynx reveal no lesions or exudates. Teeth in good repair. Neck: No deformities, masses, or tenderness noted. Range of motion & Thyroid  normal. Lungs: Normal respiratory effort; chest expands symmetrically. Lungs are clear to auscultation without rales, wheezes, or increased work of breathing. Heart: Normal rate and rhythm. Normal S1 and S2. No gallop, click, or rub. S4 w/o murmur. Abdomen: Bowel sounds normal; abdomen soft and nontender. No masses, organomegaly or hernias noted. Genitalia: Dr Seymour Bars Musculoskeletal/extremities: No deformity or scoliosis noted of  the thoracic or lumbar spine. No clubbing, cyanosis, edema, or deformity noted. Range of motion   normal .Tone & strength  normal.Joints normal. Nail health  Good.Slight crepitus of shoulder Vascular: Carotid, radial artery, dorsalis pedis and  posterior tibial pulses are full and equal. No bruits present. Neurologic: Alert and oriented x3. Deep tendon reflexes symmetrical and normal.          Skin: Intact without suspicious lesions or rashes. Lymph: No cervical, axillary lymphadenopathy present. Psych: Mood and affect are normal. Normally interactive                                                                                         Assessment & Plan:  #1 comprehensive physical exam; no acute findings  Plan: see Orders

## 2012-02-15 NOTE — Patient Instructions (Addendum)
Preventive Health Care: Eat a low-fat diet with lots of fruits and vegetables, up to 7-9 servings per day.   If you activate My Chart; the results can be released to you as soon as they populate from the lab. If you choose not to use this program; the labs have to be reviewed, copied & mailed   causing a delay in getting the results to you.

## 2012-02-16 LAB — CBC WITH DIFFERENTIAL/PLATELET
Basophils Absolute: 0.1 10*3/uL (ref 0.0–0.1)
Basophils Relative: 1.3 % (ref 0.0–3.0)
Eosinophils Absolute: 0.1 10*3/uL (ref 0.0–0.7)
Lymphocytes Relative: 24.3 % (ref 12.0–46.0)
MCHC: 33.7 g/dL (ref 30.0–36.0)
Neutrophils Relative %: 66.9 % (ref 43.0–77.0)
RBC: 4.06 Mil/uL (ref 3.87–5.11)
WBC: 6 10*3/uL (ref 4.5–10.5)

## 2012-02-16 LAB — HEPATIC FUNCTION PANEL
Alkaline Phosphatase: 23 U/L — ABNORMAL LOW (ref 39–117)
Bilirubin, Direct: 0.1 mg/dL (ref 0.0–0.3)
Total Protein: 7.2 g/dL (ref 6.0–8.3)

## 2012-02-16 LAB — LIPID PANEL
HDL: 65 mg/dL (ref >39.00)
VLDL: 11.8 mg/dL (ref 0.0–40.0)

## 2012-02-16 LAB — BASIC METABOLIC PANEL
CO2: 26 mEq/L (ref 19–32)
Calcium: 9.1 mg/dL (ref 8.4–10.5)
Creatinine, Ser: 0.8 mg/dL (ref 0.4–1.2)

## 2012-02-18 LAB — VITAMIN D 1,25 DIHYDROXY
Vitamin D 1, 25 (OH)2 Total: 44 pg/mL (ref 18–72)
Vitamin D3 1, 25 (OH)2: 44 pg/mL

## 2012-04-23 ENCOUNTER — Other Ambulatory Visit (INDEPENDENT_AMBULATORY_CARE_PROVIDER_SITE_OTHER): Payer: BC Managed Care – PPO

## 2012-04-23 DIAGNOSIS — E876 Hypokalemia: Secondary | ICD-10-CM

## 2012-04-23 LAB — BASIC METABOLIC PANEL
BUN: 12 mg/dL (ref 6–23)
CO2: 28 mEq/L (ref 19–32)
Calcium: 9.4 mg/dL (ref 8.4–10.5)
Creatinine, Ser: 0.9 mg/dL (ref 0.4–1.2)
Glucose, Bld: 84 mg/dL (ref 70–99)

## 2012-09-12 ENCOUNTER — Other Ambulatory Visit: Payer: Self-pay

## 2012-09-12 DIAGNOSIS — Z1231 Encounter for screening mammogram for malignant neoplasm of breast: Secondary | ICD-10-CM

## 2012-10-22 ENCOUNTER — Ambulatory Visit: Payer: BC Managed Care – PPO

## 2012-11-01 ENCOUNTER — Ambulatory Visit
Admission: RE | Admit: 2012-11-01 | Discharge: 2012-11-01 | Disposition: A | Discharge status: Home / Self Care | Payer: BC Managed Care – PPO | Source: Ambulatory Visit

## 2012-11-01 DIAGNOSIS — Z1231 Encounter for screening mammogram for malignant neoplasm of breast: Secondary | ICD-10-CM

## 2013-05-15 ENCOUNTER — Ambulatory Visit (INDEPENDENT_AMBULATORY_CARE_PROVIDER_SITE_OTHER): Payer: BC Managed Care – PPO | Admitting: Internal Medicine

## 2013-05-15 ENCOUNTER — Encounter: Payer: Self-pay | Admitting: Internal Medicine

## 2013-05-15 ENCOUNTER — Other Ambulatory Visit (INDEPENDENT_AMBULATORY_CARE_PROVIDER_SITE_OTHER): Payer: BC Managed Care – PPO

## 2013-05-15 VITALS — BP 128/78 | HR 72 | Temp 97.5°F | Ht 66.5 in | Wt 161.8 lb

## 2013-05-15 DIAGNOSIS — E785 Hyperlipidemia, unspecified: Secondary | ICD-10-CM

## 2013-05-15 DIAGNOSIS — D126 Benign neoplasm of colon, unspecified: Secondary | ICD-10-CM

## 2013-05-15 DIAGNOSIS — Z0000 Encounter for general adult medical examination without abnormal findings: Secondary | ICD-10-CM

## 2013-05-15 LAB — CBC WITH DIFFERENTIAL/PLATELET
BASOS PCT: 0.5 % (ref 0.0–3.0)
Basophils Absolute: 0 10*3/uL (ref 0.0–0.1)
EOS ABS: 0.1 10*3/uL (ref 0.0–0.7)
Eosinophils Relative: 1.9 % (ref 0.0–5.0)
HEMATOCRIT: 40.5 % (ref 36.0–46.0)
HEMOGLOBIN: 13.7 g/dL (ref 12.0–15.0)
LYMPHS ABS: 2 10*3/uL (ref 0.7–4.0)
LYMPHS PCT: 30.2 % (ref 12.0–46.0)
MCHC: 33.8 g/dL (ref 30.0–36.0)
MCV: 89.6 fl (ref 78.0–100.0)
Monocytes Absolute: 0.4 10*3/uL (ref 0.1–1.0)
Monocytes Relative: 6 % (ref 3.0–12.0)
NEUTROS ABS: 4 10*3/uL (ref 1.4–7.7)
Neutrophils Relative %: 61.4 % (ref 43.0–77.0)
Platelets: 252 10*3/uL (ref 150.0–400.0)
RBC: 4.52 Mil/uL (ref 3.87–5.11)
RDW: 13.5 % (ref 11.5–14.6)
WBC: 6.6 10*3/uL (ref 4.5–10.5)

## 2013-05-15 LAB — LIPID PANEL
CHOLESTEROL: 248 mg/dL — AB (ref 0–200)
HDL: 64.4 mg/dL (ref >39.00)
LDL Cholesterol: 166 mg/dL — ABNORMAL HIGH (ref 0–99)
Total CHOL/HDL Ratio: 4
Triglycerides: 87 mg/dL (ref 0.0–149.0)
VLDL: 17.4 mg/dL (ref 0.0–40.0)

## 2013-05-15 LAB — HEPATIC FUNCTION PANEL
ALBUMIN: 4.6 g/dL (ref 3.5–5.2)
ALK PHOS: 28 U/L — AB (ref 39–117)
ALT: 30 U/L (ref 0–35)
AST: 29 U/L (ref 0–37)
Bilirubin, Direct: 0 mg/dL (ref 0.0–0.3)
TOTAL PROTEIN: 7.9 g/dL (ref 6.0–8.3)
Total Bilirubin: 0.7 mg/dL (ref 0.3–1.2)

## 2013-05-15 LAB — TSH: TSH: 2.04 u[IU]/mL (ref 0.35–5.50)

## 2013-05-15 LAB — BASIC METABOLIC PANEL
BUN: 14 mg/dL (ref 6–23)
CALCIUM: 9.9 mg/dL (ref 8.4–10.5)
CHLORIDE: 102 meq/L (ref 96–112)
CO2: 29 meq/L (ref 19–32)
Creatinine, Ser: 0.9 mg/dL (ref 0.4–1.2)
GFR: 65.91 mL/min (ref >60.00)
GLUCOSE: 99 mg/dL (ref 70–99)
POTASSIUM: 5 meq/L (ref 3.5–5.1)
SODIUM: 140 meq/L (ref 135–145)

## 2013-05-15 NOTE — Patient Instructions (Addendum)
Your next office appointment will be determined based upon review of your pending labs .With My Chart there is direct physician to patient communication in "real time". With My Chart you should  receive results very quickly, usually in less than 24 hours if you sign up as soon as possible after your visit.This is especially important with acute heath problems associated with significant lab abnormalities. Plain Mucinex (NOT D) for thick secretions ;force NON dairy fluids .   Nasal cleansing in the shower as discussed with lather of mild shampoo.After 10 seconds wash off lather while  exhaling through nostrils. Make sure that all residual soap is removed to prevent irritation.  Flonase OR Nasacort AQ 1 spray in each nostril twice a day as needed. Use the "crossover" technique into opposite nostril spraying toward opposite ear @ 45 degree angle, not straight up into nostril.  Use a Neti pot daily only  as needed for significant sinus congestion; going from open side to congested side . Plain Allegra (NOT D )  160 daily , Loratidine 10 mg , OR Zyrtec 10 mg @ bedtime  as needed for itchy eyes & sneezing.

## 2013-05-15 NOTE — Progress Notes (Signed)
   Subjective:    Patient ID: Jean Pittman, female    DOB: Feb 04, 1961, 53 y.o.   MRN: [id number]  HPI  She is here for a physical;acute issues include viral URI symptoms which are resolving.     Review of Systems A heart healthy diet is followed; exercise encompasses 60 minutes 3  times per week as  CVE without symptoms.  Family history is negative for premature coronary disease. Advanced cholesterol testing reveals  LDL goal is less than 115 ; ideally < 85 . To date no statin.  Low dose ASA   not taken Specifically denied are  chest pain, palpitations, dyspnea, or claudication.      Objective:   Physical Exam Gen.: Healthy and well-nourished in appearance. Alert, appropriate and cooperative throughout exam. Appears younger than stated age  Head: Normocephalic without obvious abnormalities;hair fine. Eyes: No corneal or conjunctival inflammation noted. Pupils equal round reactive to light and accommodation. Extraocular motion intact. Ears: External  ear exam reveals no significant lesions or deformities. Canals clear .TMs normal. Hearing is grossly normal bilaterally. Nose: External nasal exam reveals no deformity or inflammation. Nasal mucosa are pink and moist. No lesions or exudates noted.   Mouth: Oral mucosa and oropharynx reveal no lesions or exudates. Teeth in good repair. Neck: No deformities, masses, or tenderness noted. Range of motion decreased.Thyroid normal.. Lungs: Normal respiratory effort; chest expands symmetrically. Lungs are clear to auscultation without rales, wheezes, or increased work of breathing. Heart: Normal rate and rhythm. Normal S1 and S2. No gallop, click, or rub. No murmur. Abdomen: Bowel sounds normal; abdomen soft and nontender. No masses, organomegaly or hernias noted. Genitalia:as per Gyn                                  Musculoskeletal/extremities: No deformity or scoliosis noted of  the thoracic or lumbar spine.   No clubbing, cyanosis, edema, or  significant extremity  deformity noted. Range of motion normal .Tone & strength normal. Hand joints normal  Fingernail health good. Able to lie down & sit up w/o help. Negative SLR bilaterally Vascular: Carotid, radial artery, dorsalis pedis and  posterior tibial pulses are full and equal. No bruits present. Neurologic: Alert and oriented x3. Deep tendon reflexes symmetrical and normal.  Gait normal        Skin: Intact without suspicious lesions or rashes. Lymph: No cervical, axillary lymphadenopathy present. Psych: Mood and affect are normal. Normally interactive                                                                                        Assessment & Plan:  #1 comprehensive physical exam; no acute findings  Plan: see Orders  & Recommendations

## 2013-05-15 NOTE — Progress Notes (Signed)
Pre visit review using our clinic review tool, if applicable. No additional management support is needed unless otherwise documented below in the visit note. 

## 2013-05-22 ENCOUNTER — Encounter: Payer: Self-pay | Admitting: *Deleted

## 2013-10-02 ENCOUNTER — Encounter: Payer: Self-pay | Admitting: Gastroenterology

## 2013-10-09 ENCOUNTER — Other Ambulatory Visit: Payer: Self-pay

## 2013-10-09 DIAGNOSIS — Z1231 Encounter for screening mammogram for malignant neoplasm of breast: Secondary | ICD-10-CM

## 2013-11-05 ENCOUNTER — Ambulatory Visit
Admission: RE | Admit: 2013-11-05 | Discharge: 2013-11-05 | Disposition: A | Discharge status: Home / Self Care | Payer: BC Managed Care – PPO | Source: Ambulatory Visit

## 2013-11-05 DIAGNOSIS — Z1231 Encounter for screening mammogram for malignant neoplasm of breast: Secondary | ICD-10-CM

## 2014-03-29 ENCOUNTER — Encounter: Payer: Self-pay | Admitting: Gastroenterology

## 2014-04-07 ENCOUNTER — Encounter: Payer: Self-pay | Admitting: Gastroenterology

## 2014-04-29 ENCOUNTER — Encounter: Payer: Self-pay | Admitting: Gastroenterology

## 2014-06-19 ENCOUNTER — Ambulatory Visit (INDEPENDENT_AMBULATORY_CARE_PROVIDER_SITE_OTHER): Payer: BLUE CROSS/BLUE SHIELD | Admitting: Internal Medicine

## 2014-06-19 ENCOUNTER — Encounter: Payer: Self-pay | Admitting: Internal Medicine

## 2014-06-19 VITALS — BP 112/86 | HR 70 | Temp 97.9°F | Ht 67.0 in | Wt 157.2 lb

## 2014-06-19 DIAGNOSIS — H8393 Unspecified disease of inner ear, bilateral: Secondary | ICD-10-CM

## 2014-06-19 DIAGNOSIS — D126 Benign neoplasm of colon, unspecified: Secondary | ICD-10-CM | POA: Diagnosis not present

## 2014-06-19 MED ORDER — HYDROCHLOROTHIAZIDE 12.5 MG PO CAPS: 12.5000 mg | ORAL_CAPSULE | Freq: Every day | ORAL | Status: DC

## 2014-06-19 MED ORDER — SCOPOLAMINE 1 MG/3DAYS TD PT72: 1.0000 | MEDICATED_PATCH | TRANSDERMAL | Status: DC

## 2014-06-19 NOTE — Patient Instructions (Addendum)
Drink thin  fluids liberally through the day and chew sugarless gum .Flonase 1 spray in each nostril twice a day as needed. Use the "crossover" technique as discussed.  Transderm scop one patch every 3 days until symptoms resolve.

## 2014-06-19 NOTE — Progress Notes (Signed)
Pre visit review using our clinic review tool, if applicable. No additional management support is needed unless otherwise documented below in the visit note. 

## 2014-06-19 NOTE — Progress Notes (Signed)
   Subjective:    Patient ID: Jean Pittman, female    DOB: 1960-09-22, 54 y.o.   MRN: [id number]  HPI She has had severe balance dysfunction since she's returned from [place] 06/11/14. Prior to landing in [place] she had pressure in ears which did not resolve until after the plane was on the ground. Following that she's had dizziness with position change including standing or turning over in bed. She describes being "waited down" .  No cardiac or neuro prodrome prior to onset of symptoms.  She was seen in urgent care told that she had sinusitis and placed on Z-Pak and a prednisone course. Symptoms have not responded.   She is on a  a heart healthy ,low salt diet. She exercises 5 days a week. Twice a week she walks 5 miles. 3 days per week she does cardiovascular. She has no associated cardiovascular symptoms although she's had some "racing" since she's been back from [place].The dizziness has curtailed the exercise.  She had a tubular adenoma in 2011. She scheduled for follow-up colonoscopy in near future. She has no active GI problems.     Review of Systems  There was no associated hearing loss or tinnitus with the acute symptoms. She had no blurred vision, double vision, loss of vision.  Denied were any change in heart rhythm or rate prior to the event. There was no associated chest pain or shortness of breath .  Also specifically denied prior to the episode were headache, limb weakness, tingling, or numbness. No seizure activity noted.  Unexplained weight loss, abdominal pain, significant dyspepsia, dysphagia, melena, rectal bleeding, or persistently small caliber stools are denied.    Objective:   Physical Exam General appearance:Adequately nourished; no acute distress or increased work of breathing is present.    Lymphatic: No  lymphadenopathy about the head, neck, or axilla .  Eyes: No conjunctival inflammation or lid edema is present. There is no scleral icterus.Unsustained  nystagmus with lateral gaze in either direction. EOM & FOV WNL.  Ears:  External ear exam shows no significant lesions or deformities.  Otoscopic examination reveals clear canals, tympanic membranes are intact bilaterally without bulging, retraction, inflammation or discharge.Hearing is normal to include whisper at 6 feet. Tuning fork exam was also normal.   Nose:  External nasal examination shows no deformity or inflammation. Nasal mucosa are pink and moist without lesions or exudates No septal dislocation or deviation.No obstruction to airflow.   Oral exam: Dental hygiene is good; lips and gums are healthy appearing.There is no oropharyngeal erythema or exudate .  Neck:  No deformities, thyromegaly, masses, or tenderness noted.   Supple with full range of motion without pain.   Heart:  Normal rate and regular rhythm. S1 and S2 normal without gallop, murmur, click, rub or other extra sounds.   Lungs:Chest clear to auscultation; no wheezes, rhonchi,rales ,or rubs present.  Extremities:  No cyanosis, edema, or clubbing  noted    Skin: Warm & dry w/o tenting or jaundice. No significant lesions or rash.  Neurologic exam : Cn 2-7 intact Strength equal & normal in upper & lower extremities Balance unsteady  Romberg normal, finger to nose normal       Assessment & Plan:  #1 dizziness due to acute inner ear dysfunction See orders

## 2014-06-21 ENCOUNTER — Other Ambulatory Visit: Payer: Self-pay | Admitting: Internal Medicine

## 2014-06-21 DIAGNOSIS — Z0000 Encounter for general adult medical examination without abnormal findings: Secondary | ICD-10-CM

## 2014-06-23 ENCOUNTER — Other Ambulatory Visit (INDEPENDENT_AMBULATORY_CARE_PROVIDER_SITE_OTHER): Payer: BLUE CROSS/BLUE SHIELD

## 2014-06-23 DIAGNOSIS — Z0000 Encounter for general adult medical examination without abnormal findings: Secondary | ICD-10-CM | POA: Diagnosis not present

## 2014-06-23 LAB — CBC WITH DIFFERENTIAL/PLATELET
BASOS ABS: 0 10*3/uL (ref 0.0–0.1)
Basophils Relative: 0.6 % (ref 0.0–3.0)
EOS ABS: 0.1 10*3/uL (ref 0.0–0.7)
EOS PCT: 1.2 % (ref 0.0–5.0)
HCT: 41.1 % (ref 36.0–46.0)
HEMOGLOBIN: 14.1 g/dL (ref 12.0–15.0)
LYMPHS PCT: 31.7 % (ref 12.0–46.0)
Lymphs Abs: 1.7 10*3/uL (ref 0.7–4.0)
MCHC: 34.2 g/dL (ref 30.0–36.0)
MCV: 88.9 fl (ref 78.0–100.0)
MONO ABS: 0.6 10*3/uL (ref 0.1–1.0)
Monocytes Relative: 10.3 % (ref 3.0–12.0)
Neutro Abs: 3.1 10*3/uL (ref 1.4–7.7)
Neutrophils Relative %: 56.2 % (ref 43.0–77.0)
Platelets: 251 10*3/uL (ref 150.0–400.0)
RBC: 4.62 Mil/uL (ref 3.87–5.11)
RDW: 13.6 % (ref 11.5–15.5)
WBC: 5.5 10*3/uL (ref 4.0–10.5)

## 2014-06-23 LAB — LIPID PANEL
Cholesterol: 224 mg/dL — ABNORMAL HIGH (ref 0–200)
HDL: 56 mg/dL (ref >39.00)
LDL Cholesterol: 149 mg/dL — ABNORMAL HIGH (ref 0–99)
NONHDL: 168
TRIGLYCERIDES: 95 mg/dL (ref 0.0–149.0)
Total CHOL/HDL Ratio: 4
VLDL: 19 mg/dL (ref 0.0–40.0)

## 2014-06-23 LAB — BASIC METABOLIC PANEL
BUN: 15 mg/dL (ref 6–23)
CALCIUM: 9.8 mg/dL (ref 8.4–10.5)
CO2: 32 meq/L (ref 19–32)
Chloride: 100 mEq/L (ref 96–112)
Creatinine, Ser: 1.01 mg/dL (ref 0.40–1.20)
GFR: 60.42 mL/min (ref >60.00)
Glucose, Bld: 90 mg/dL (ref 70–99)
Potassium: 4.1 mEq/L (ref 3.5–5.1)
Sodium: 137 mEq/L (ref 135–145)

## 2014-06-23 LAB — HEPATIC FUNCTION PANEL
ALK PHOS: 26 U/L — AB (ref 39–117)
ALT: 19 U/L (ref 0–35)
AST: 15 U/L (ref 0–37)
Albumin: 4 g/dL (ref 3.5–5.2)
BILIRUBIN DIRECT: 0.1 mg/dL (ref 0.0–0.3)
BILIRUBIN TOTAL: 0.6 mg/dL (ref 0.2–1.2)
Total Protein: 6.8 g/dL (ref 6.0–8.3)

## 2014-06-23 LAB — TSH: TSH: 3.7 u[IU]/mL (ref 0.35–4.50)

## 2014-06-24 ENCOUNTER — Encounter: Payer: Self-pay | Admitting: Internal Medicine

## 2014-06-24 ENCOUNTER — Ambulatory Visit (INDEPENDENT_AMBULATORY_CARE_PROVIDER_SITE_OTHER): Payer: BLUE CROSS/BLUE SHIELD | Admitting: Internal Medicine

## 2014-06-24 ENCOUNTER — Ambulatory Visit (INDEPENDENT_AMBULATORY_CARE_PROVIDER_SITE_OTHER)
Admission: RE | Admit: 2014-06-24 | Discharge: 2014-06-24 | Disposition: A | Discharge status: Home / Self Care | Payer: BLUE CROSS/BLUE SHIELD | Source: Ambulatory Visit | Attending: Internal Medicine | Admitting: Internal Medicine

## 2014-06-24 VITALS — BP 122/78 | HR 59 | Temp 97.8°F | Ht 67.0 in | Wt 156.2 lb

## 2014-06-24 DIAGNOSIS — R42 Dizziness and giddiness: Secondary | ICD-10-CM | POA: Diagnosis not present

## 2014-06-24 DIAGNOSIS — R059 Cough, unspecified: Secondary | ICD-10-CM

## 2014-06-24 DIAGNOSIS — R05 Cough: Secondary | ICD-10-CM | POA: Diagnosis not present

## 2014-06-24 DIAGNOSIS — H9390 Unspecified disorder of ear, unspecified ear: Secondary | ICD-10-CM

## 2014-06-24 MED ORDER — ALBUTEROL SULFATE 108 (90 BASE) MCG/ACT IN AEPB: 2.0000 | INHALATION_SPRAY | Freq: Four times a day (QID) | RESPIRATORY_TRACT | Status: DC | PRN

## 2014-06-24 NOTE — Patient Instructions (Signed)
The ENT referral will be scheduled and you'll be notified of the time.Please call the Referral Co-Ordinator @ [phone] if you have not been notified of appointment time within 7 days.

## 2014-06-24 NOTE — Progress Notes (Signed)
   Subjective:    Patient ID: Jean Pittman, female    DOB: February 24, 1961, 54 y.o.   MRN: [id number]  HPI  She was to return for a physical exam;but her dizziness persists & is only 10% better with Transderm-Scop, Nasacort AQ intranasally, and HCTZ.  History was verified. She had acute pain in her ears up to level VII immediately before landing in [place] after flying cross-country. As soon as she was on the ground she noted the dizziness. It is exacerbated now with rotation of her head or position change in bed.  She now describes a cough which is nonproductive. She used Mucinex DM with minimal response. It is somewhat "rattly". She describes her chest as "airy". She has no history of asthma.  Her extensive labs were reviewed. She does have dyslipidemia with an LDL risk long-term of 34%. The risks and options were discussed.    Review of Systems Frontal headache, facial pain , nasal purulence, dental pain, sore throat , otic pain or otic discharge denied.  Fever, chills, sweats, or unexplained weight loss not present. No significant headaches. Mental status change or memory loss denied. Blurred vision , diplopia or vision loss absent. Frank vertigo, near syncope or imbalance denied. There is no numbness, tingling, or weakness in extremities.   No loss of control of bladder or bowels. Radicular type pain absent. No seizure stigmata.     Objective:   Physical Exam  Pertinent or positive findings include: Tuning fork lateralizes to the right.  No nystagmus could be induced with extraocular motion or with testing for benign positional vertigo. Neurologic exam : Cn 2-7 intact Strength equal & normal in upper & lower extremities Able to walk on heels and toes.   Balance normal  Romberg normal, finger to nose normal DTRs normal.  General appearance :adequately nourished; in no distress. Eyes: No conjunctival inflammation or scleral icterus is present. Oral exam:  Lips and gums are  healthy appearing.There is no oropharyngeal erythema or exudate noted. Dental hygiene is good. Heart:  Normal rate and regular rhythm. S1 and S2 normal without gallop, murmur, click, rub or other extra sounds   Lungs:Chest clear to auscultation; no wheezes, rhonchi,rales ,or rubs present.No increased work of breathing.  Abdomen: bowel sounds normal, soft and non-tender without masses, organomegaly or hernias noted.  No guarding or rebound.  Vascular : all pulses equal ; no bruits present. Skin:Warm & dry.  Intact without suspicious lesions or rashes ; no tenting or jaundice  Lymphatic: No lymphadenopathy is noted about the head, neck, axilla          Assessment & Plan:  #1 dizziness secondary to inner ear dysfunction in the context of barotrauma pre landing.Tuning fork laterization to right  #2 cough  #3 dyslipidemia Plan: See orders and recommendations

## 2014-06-24 NOTE — Progress Notes (Signed)
Pre visit review using our clinic review tool, if applicable. No additional management support is needed unless otherwise documented below in the visit note. 

## 2014-06-27 ENCOUNTER — Encounter: Payer: Self-pay | Admitting: Gastroenterology

## 2014-07-02 ENCOUNTER — Ambulatory Visit (AMBULATORY_SURGERY_CENTER): Payer: Self-pay

## 2014-07-02 VITALS — Ht 67.0 in | Wt 160.0 lb

## 2014-07-02 DIAGNOSIS — Z860100 Personal history of colon polyps, unspecified: Secondary | ICD-10-CM

## 2014-07-02 MED ORDER — MOVIPREP 100 G PO SOLR: 1.0000 | Freq: Once | ORAL | Status: DC

## 2014-07-02 NOTE — Progress Notes (Signed)
No allergies to eggs or soy No diet/weight loss meds No home oxygen No past problems with anesthesia   

## 2014-07-03 ENCOUNTER — Encounter: Payer: Self-pay | Admitting: Gastroenterology

## 2014-07-16 ENCOUNTER — Encounter: Payer: Self-pay | Admitting: Gastroenterology

## 2014-07-16 ENCOUNTER — Other Ambulatory Visit: Payer: Self-pay | Admitting: Gastroenterology

## 2014-07-16 ENCOUNTER — Ambulatory Visit (AMBULATORY_SURGERY_CENTER): Payer: BLUE CROSS/BLUE SHIELD | Admitting: Gastroenterology

## 2014-07-16 VITALS — BP 124/81 | HR 63 | Temp 96.8°F | Resp 10 | Ht 67.0 in | Wt 160.0 lb

## 2014-07-16 DIAGNOSIS — K635 Polyp of colon: Secondary | ICD-10-CM

## 2014-07-16 DIAGNOSIS — Z86010 Personal history of colonic polyps: Secondary | ICD-10-CM

## 2014-07-16 DIAGNOSIS — D120 Benign neoplasm of cecum: Secondary | ICD-10-CM

## 2014-07-16 MED ORDER — SODIUM CHLORIDE 0.9 % IV SOLN: 500.0000 mL | INTRAVENOUS | Status: DC

## 2014-07-16 NOTE — Patient Instructions (Signed)

## 2014-07-16 NOTE — Op Note (Signed)
[place]  [address]. [place], [id number]   COLONOSCOPY PROCEDURE REPORT  PATIENT: Jean, Pittman  MR#: [id number] BIRTHDATE: 1960-10-16 , 15  yrs. old GENDER: female ENDOSCOPIST: Milus Banister, MD PROCEDURE DATE:  07/16/2014 PROCEDURE:   Colonoscopy, surveillance and Colonoscopy with snare polypectomy First Screening Colonoscopy - Avg.  risk and is 50 yrs.  old or older - No.  Prior Negative Screening - Now for repeat screening. N/A  History of Adenoma - Now for follow-up colonoscopy & has been > or = to 3 yrs.  Yes hx of adenoma.  Has been 3 or more years since last colonoscopy.  Polyps removed today? Yes ASA CLASS:   Class II INDICATIONS:Surveillance due to prior colonic neoplasia (single subCM adenom removed by Colonoscopy Dr. Sharlett Iles 2011). MEDICATIONS: Monitored anesthesia care and Propofol 200 mg IV  DESCRIPTION OF PROCEDURE:   After the risks benefits and alternatives of the procedure were thoroughly explained, informed consent was obtained.  The digital rectal exam revealed no abnormalities of the rectum.   The LB [id number]-H190 [id number]  endoscope was introduced through the anus and advanced to the cecum, which was identified by both the appendix and ileocecal valve. No adverse events experienced.   The quality of the prep was excellent.  The instrument was then slowly withdrawn as the colon was fully examined. Estimated blood loss is zero unless otherwise noted in this procedure report.  COLON FINDINGS: A sessile polyp measuring 3 mm in size was found at the cecum.  A polypectomy was performed with a cold snare.  The resection was complete, the polyp tissue was completely retrieved and sent to histology.   The examination was otherwise normal. Retroflexed views revealed no abnormalities. The time to cecum = 3.6 Withdrawal time = 8.6   The scope was withdrawn and the procedure completed. COMPLICATIONS: There were no immediate  complications.  ENDOSCOPIC IMPRESSION: 1.   Sessile polyp was found at the cecum; polypectomy was performed with a cold snare 2.   The examination was otherwise normal  RECOMMENDATIONS: If the polyp(s) removed today are proven to be adenomatous (pre-cancerous) polyps, you will need a repeat colonoscopy in 5 years.  Otherwise you should continue to follow colorectal cancer screening guidelines for "routine risk" patients with colonoscopy in 10 years.  You will receive a letter within 1-2 weeks with the results of your biopsy as well as final recommendations.  Please call my office if you have not received a letter after 3 weeks.  eSigned:  Milus Banister, MD 07/16/2014 9:18 AM   cc: Unice Cobble, MD

## 2014-07-16 NOTE — Progress Notes (Signed)
A/ox3 pleased with MAC, report to Wendy RN 

## 2014-07-16 NOTE — Progress Notes (Signed)
Called to room to assist during endoscopic procedure.  Patient ID and intended procedure confirmed with present staff. Received instructions for my participation in the procedure from the performing physician.  

## 2014-07-17 ENCOUNTER — Telehealth: Payer: Self-pay

## 2014-07-17 NOTE — Telephone Encounter (Signed)
  Follow up Call-  Call back number 07/16/2014  Post procedure Call Back phone  # [phone]  Permission to leave phone message Yes     Patient questions:  Do you have a fever, pain , or abdominal swelling? No. Pain Score  0 *  Have you tolerated food without any problems? Yes.    Have you been able to return to your normal activities? Yes.    Do you have any questions about your discharge instructions: Diet   No. Medications  No. Follow up visit  No.  Do you have questions or concerns about your Care? No.  Actions: * If pain score is 4 or above: No action needed, pain <4.

## 2014-07-22 ENCOUNTER — Telehealth: Payer: Self-pay | Admitting: Gastroenterology

## 2014-07-22 ENCOUNTER — Encounter: Payer: Self-pay | Admitting: Gastroenterology

## 2014-07-22 NOTE — Telephone Encounter (Signed)
Letter has been mailed to the pt home and sent to the pt via my chart

## 2014-08-01 NOTE — Addendum Note (Signed)
Addended by: Steva Ready on: 08/01/2014 11:31 AM   Modules accepted: Level of Service

## 2015-03-01 HISTORY — PX: CT SCAN: SHX5351

## 2015-06-22 DIAGNOSIS — K048 Radicular cyst: Secondary | ICD-10-CM | POA: Diagnosis not present

## 2015-09-25 DIAGNOSIS — H1013 Acute atopic conjunctivitis, bilateral: Secondary | ICD-10-CM | POA: Diagnosis not present

## 2015-10-11 NOTE — Progress Notes (Signed)
Subjective:    Patient ID: Jean Pittman, female    DOB: 1960-04-21, 55 y.o.   MRN: [id number]  HPI She is here to establish with a new pcp.  She is here for a physical exam.   Recently she has been bruising easily.  Some are are related to trauma, some are not.  The bruises tend to stay longer than normal.    Sciatica:  She was roller skating in June and fell.  Her sciatica started then and she has it intermittently since then.  She has a history of sciatica.  It is in the left leg.  She has pain in the leg, but no numbness/tingling/burning.  She denies back pain.  She is taking two aleve as needed.   Right leg pain:  She gets right leg pain when she stands.  She feels it typically occurs after sitting for a while.    She had a tick bite a couple of weeks ago.  She has had another bite from a tick recently.    Medications and allergies reviewed with patient and updated if appropriate.  Patient Active Problem List   Diagnosis Date Noted  . EKG abnormalities 02/15/2012  . ADENOMATOUS COLONIC POLYP 04/16/2010  . REACTIVE AIRWAY DISEASE 04/16/2010  . Hyperlipidemia 03/12/2009    Current Outpatient Prescriptions on File Prior to Visit  Medication Sig Dispense Refill  . Multiple Vitamin (MULTIVITAMIN) tablet Take 1 tablet by mouth daily. Probiotic with MVI     No current facility-administered medications on file prior to visit.     Past Medical History:  Diagnosis Date  . Dizziness   . Hyperlipidemia   . Tubular adenoma of colon 03/30/09   Dr Sharlett Iles    Past Surgical History:  Procedure Laterality Date  . APPENDECTOMY     with 1st pregnancy  . BREAST BIOPSY     needle aspiration X1; resection X 1  . colonoscopy with polypectomy  03/30/2009   Dr Sharlett Iles  . G 3 P 3     3 C sections  . TUBAL LIGATION      Social History   Social History  . Marital status: Married    Spouse name: N/A  . Number of children: N/A  . Years of education: N/A   Social History Main  Topics  . Smoking status: Never Smoker  . Smokeless tobacco: Never Used  . Alcohol use No  . Drug use: No  . Sexual activity: Not Asked   Other Topics Concern  . None   Social History Narrative   Exercises regularly    Family History  Problem Relation Age of Onset  . Breast cancer Maternal Aunt   . Diabetes Maternal Aunt   . Stroke Maternal Aunt     ? . one M aunt;> 65  . Heart disease Neg Hx   . Colon cancer Neg Hx     Review of Systems  Constitutional: Negative for appetite change, chills, fatigue and fever.  Eyes: Negative for visual disturbance.  Respiratory: Negative for cough, shortness of breath and wheezing.   Cardiovascular: Negative for chest pain, palpitations and leg swelling.  Gastrointestinal: Positive for anal bleeding (occasional hemorrhoid bleeding). Negative for abdominal pain, blood in stool, constipation, diarrhea and nausea.       No gerd  Genitourinary: Negative for dysuria and hematuria.  Musculoskeletal: Positive for arthralgias (ankles achy, shoulder pain, hand arthritis). Negative for back pain (left sided sciatica) and myalgias.  Skin: Negative for color change  and rash.  Neurological: Negative for dizziness, light-headedness and headaches.  Psychiatric/Behavioral: Negative for dysphoric mood. The patient is not nervous/anxious.        Objective:   Vitals:   10/12/15 0958  BP: 118/82  Pulse: (!) 54  Resp: 16  Temp: 97.7 F (36.5 C)   Filed Weights   10/12/15 0958  Weight: 162 lb (73.5 kg)   Body mass index is 25.37 kg/m.   Physical Exam Constitutional: She appears well-developed and well-nourished. No distress.  HENT:  Head: Normocephalic and atraumatic.  Right Ear: External ear normal. Normal ear canal and TM Left Ear: External ear normal.  Normal ear canal and TM Mouth/Throat: Oropharynx is clear and moist.  Eyes: Conjunctivae and EOM are normal.  Neck: Neck supple. No tracheal deviation present. No thyromegaly present.  No  carotid bruit  Cardiovascular: Normal rate, regular rhythm and normal heart sounds.   No murmur heard.  No edema. Pulmonary/Chest: Effort normal and breath sounds normal. No respiratory distress. She has no wheezes. She has no rales.  Breast: deferred to Gyn Abdominal: Soft. She exhibits no distension. There is no tenderness.  Lymphadenopathy: She has no cervical adenopathy.  Skin: Skin is warm and dry. She is not diaphoretic.  Psychiatric: She has a normal mood and affect. Her behavior is normal.         Assessment & Plan:   Physical exam: Screening blood work   ordered Immunizations  Up to date  Colonoscopy  Up to date  Mammogram  Up to date  Gyn  Up to date  Dexa  - not done yet Eye exams - Up to date  EKG  - last done 01/2012 Exercise - regular Weight --  Just above normal BMI Skin - no concerns, sees derm annaully Substance abuse - none  See Problem List for Assessment and Plan of chronic medical problems.

## 2015-10-11 NOTE — Patient Instructions (Addendum)
Test(s) ordered today. Your results will be released to [place] (or called to you) after review, usually within 72hours after test completion. If any changes need to be made, you will be notified at that same time.  All other Health Maintenance issues reviewed.   All recommended immunizations and age-appropriate screenings are up-to-date or discussed.  No immunizations administered today.   Medications reviewed and updated.  No changes recommended at this time.   Please followup in one year for a physical     Health Maintenance, Female Adopting a healthy lifestyle and getting preventive care can go a long way to promote health and wellness. Talk with your health care provider about what schedule of regular examinations is right for you. This is a good chance for you to check in with your provider about disease prevention and staying healthy. In between checkups, there are plenty of things you can do on your own. Experts have done a lot of research about which lifestyle changes and preventive measures are most likely to keep you healthy. Ask your health care provider for more information. WEIGHT AND DIET  Eat a healthy diet  Be sure to include plenty of vegetables, fruits, low-fat dairy products, and lean protein.  Do not eat a lot of foods high in solid fats, added sugars, or salt.  Get regular exercise. This is one of the most important things you can do for your health.  Most adults should exercise for at least 150 minutes each week. The exercise should increase your heart rate and make you sweat (moderate-intensity exercise).  Most adults should also do strengthening exercises at least twice a week. This is in addition to the moderate-intensity exercise.  Maintain a healthy weight  Body mass index (BMI) is a measurement that can be used to identify possible weight problems. It estimates body fat based on height and weight. Your health care provider can help determine your BMI and  help you achieve or maintain a healthy weight.  For females 97 years of age and older:   A BMI below 18.5 is considered underweight.  A BMI of 18.5 to 24.9 is normal.  A BMI of 25 to 29.9 is considered overweight.  A BMI of 30 and above is considered obese.  Watch levels of cholesterol and blood lipids  You should start having your blood tested for lipids and cholesterol at 56 years of age, then have this test every 5 years.  You may need to have your cholesterol levels checked more often if:  Your lipid or cholesterol levels are high.  You are older than 56 years of age.  You are at high risk for heart disease.  CANCER SCREENING   Lung Cancer  Lung cancer screening is recommended for adults 68-25 years old who are at high risk for lung cancer because of a history of smoking.  A yearly low-dose CT scan of the lungs is recommended for people who:  Currently smoke.  Have quit within the past 15 years.  Have at least a 30-pack-year history of smoking. A pack year is smoking an average of one pack of cigarettes a day for 1 year.  Yearly screening should continue until it has been 15 years since you quit.  Yearly screening should stop if you develop a health problem that would prevent you from having lung cancer treatment.  Breast Cancer  Practice breast self-awareness. This means understanding how your breasts normally appear and feel.  It also means doing regular breast self-exams.  Let your health care provider know about any changes, no matter how small.  If you are in your 20s or 30s, you should have a clinical breast exam (CBE) by a health care provider every 1-3 years as part of a regular health exam.  If you are 72 or older, have a CBE every year. Also consider having a breast X-ray (mammogram) every year.  If you have a family history of breast cancer, talk to your health care provider about genetic screening.  If you are at high risk for breast cancer, talk  to your health care provider about having an MRI and a mammogram every year.  Breast cancer gene (BRCA) assessment is recommended for women who have family members with BRCA-related cancers. BRCA-related cancers include:  Breast.  Ovarian.  Tubal.  Peritoneal cancers.  Results of the assessment will determine the need for genetic counseling and BRCA1 and BRCA2 testing. Cervical Cancer Your health care provider may recommend that you be screened regularly for cancer of the pelvic organs (ovaries, uterus, and vagina). This screening involves a pelvic examination, including checking for microscopic changes to the surface of your cervix (Pap test). You may be encouraged to have this screening done every 3 years, beginning at age 28.  For women ages 64-65, health care providers may recommend pelvic exams and Pap testing every 3 years, or they may recommend the Pap and pelvic exam, combined with testing for human papilloma virus (HPV), every 5 years. Some types of HPV increase your risk of cervical cancer. Testing for HPV may also be done on women of any age with unclear Pap test results.  Other health care providers may not recommend any screening for nonpregnant women who are considered low risk for pelvic cancer and who do not have symptoms. Ask your health care provider if a screening pelvic exam is right for you.  If you have had past treatment for cervical cancer or a condition that could lead to cancer, you need Pap tests and screening for cancer for at least 20 years after your treatment. If Pap tests have been discontinued, your risk factors (such as having a new sexual partner) need to be reassessed to determine if screening should resume. Some women have medical problems that increase the chance of getting cervical cancer. In these cases, your health care provider may recommend more frequent screening and Pap tests. Colorectal Cancer  This type of cancer can be detected and often  prevented.  Routine colorectal cancer screening usually begins at 56 years of age and continues through 56 years of age.  Your health care provider may recommend screening at an earlier age if you have risk factors for colon cancer.  Your health care provider may also recommend using home test kits to check for hidden blood in the stool.  A small camera at the end of a tube can be used to examine your colon directly (sigmoidoscopy or colonoscopy). This is done to check for the earliest forms of colorectal cancer.  Routine screening usually begins at age 53.  Direct examination of the colon should be repeated every 5-10 years through 56 years of age. However, you may need to be screened more often if early forms of precancerous polyps or small growths are found. Skin Cancer  Check your skin from head to toe regularly.  Tell your health care provider about any new moles or changes in moles, especially if there is a change in a mole's shape or color.  Also tell  your health care provider if you have a mole that is larger than the size of a pencil eraser.  Always use sunscreen. Apply sunscreen liberally and repeatedly throughout the day.  Protect yourself by wearing long sleeves, pants, a wide-brimmed hat, and sunglasses whenever you are outside. HEART DISEASE, DIABETES, AND HIGH BLOOD PRESSURE   High blood pressure causes heart disease and increases the risk of stroke. High blood pressure is more likely to develop in:  People who have blood pressure in the high end of the normal range (130-139/85-89 mm Hg).  People who are overweight or obese.  People who are African American.  If you are 18-39 years of age, have your blood pressure checked every 3-5 years. If you are 40 years of age or older, have your blood pressure checked every year. You should have your blood pressure measured twice--once when you are at a hospital or clinic, and once when you are not at a hospital or clinic.  Record the average of the two measurements. To check your blood pressure when you are not at a hospital or clinic, you can use:  An automated blood pressure machine at a pharmacy.  A home blood pressure monitor.  If you are between 55 years and 79 years old, ask your health care provider if you should take aspirin to prevent strokes.  Have regular diabetes screenings. This involves taking a blood sample to check your fasting blood sugar level.  If you are at a normal weight and have a low risk for diabetes, have this test once every three years after 56 years of age.  If you are overweight and have a high risk for diabetes, consider being tested at a younger age or more often. PREVENTING INFECTION  Hepatitis B  If you have a higher risk for hepatitis B, you should be screened for this virus. You are considered at high risk for hepatitis B if:  You were born in a country where hepatitis B is common. Ask your health care provider which countries are considered high risk.  Your parents were born in a high-risk country, and you have not been immunized against hepatitis B (hepatitis B vaccine).  You have HIV or AIDS.  You use needles to inject street drugs.  You live with someone who has hepatitis B.  You have had sex with someone who has hepatitis B.  You get hemodialysis treatment.  You take certain medicines for conditions, including cancer, organ transplantation, and autoimmune conditions. Hepatitis C  Blood testing is recommended for:  Everyone born from 1945 through 1965.  Anyone with known risk factors for hepatitis C. Sexually transmitted infections (STIs)  You should be screened for sexually transmitted infections (STIs) including gonorrhea and chlamydia if:  You are sexually active and are younger than 56 years of age.  You are older than 56 years of age and your health care provider tells you that you are at risk for this type of infection.  Your sexual activity  has changed since you were last screened and you are at an increased risk for chlamydia or gonorrhea. Ask your health care provider if you are at risk.  If you do not have HIV, but are at risk, it may be recommended that you take a prescription medicine daily to prevent HIV infection. This is called pre-exposure prophylaxis (PrEP). You are considered at risk if:  You are sexually active and do not regularly use condoms or know the HIV status of your partner(s).    You take drugs by injection.  You are sexually active with a partner who has HIV. Talk with your health care provider about whether you are at high risk of being infected with HIV. If you choose to begin PrEP, you should first be tested for HIV. You should then be tested every 3 months for as long as you are taking PrEP.  PREGNANCY   If you are premenopausal and you may become pregnant, ask your health care provider about preconception counseling.  If you may become pregnant, take 400 to 800 micrograms (mcg) of folic acid every day.  If you want to prevent pregnancy, talk to your health care provider about birth control (contraception). OSTEOPOROSIS AND MENOPAUSE   Osteoporosis is a disease in which the bones lose minerals and strength with aging. This can result in serious bone fractures. Your risk for osteoporosis can be identified using a bone density scan.  If you are 65 years of age or older, or if you are at risk for osteoporosis and fractures, ask your health care provider if you should be screened.  Ask your health care provider whether you should take a calcium or vitamin D supplement to lower your risk for osteoporosis.  Menopause may have certain physical symptoms and risks.  Hormone replacement therapy may reduce some of these symptoms and risks. Talk to your health care provider about whether hormone replacement therapy is right for you.  HOME CARE INSTRUCTIONS   Schedule regular health, dental, and eye  exams.  Stay current with your immunizations.   Do not use any tobacco products including cigarettes, chewing tobacco, or electronic cigarettes.  If you are pregnant, do not drink alcohol.  If you are breastfeeding, limit how much and how often you drink alcohol.  Limit alcohol intake to no more than 1 drink per day for nonpregnant women. One drink equals 12 ounces of beer, 5 ounces of wine, or 1 ounces of hard liquor.  Do not use street drugs.  Do not share needles.  Ask your health care provider for help if you need support or information about quitting drugs.  Tell your health care provider if you often feel depressed.  Tell your health care provider if you have ever been abused or do not feel safe at home.   This information is not intended to replace advice given to you by your health care provider. Make sure you discuss any questions you have with your health care provider.   Document Released: 08/30/2010 Document Revised: 03/07/2014 Document Reviewed: 01/16/2013 Elsevier Interactive Patient Education 2016 [employer]  

## 2015-10-12 ENCOUNTER — Ambulatory Visit (INDEPENDENT_AMBULATORY_CARE_PROVIDER_SITE_OTHER): Payer: BLUE CROSS/BLUE SHIELD | Admitting: Internal Medicine

## 2015-10-12 ENCOUNTER — Encounter: Payer: Self-pay | Admitting: Internal Medicine

## 2015-10-12 ENCOUNTER — Other Ambulatory Visit (INDEPENDENT_AMBULATORY_CARE_PROVIDER_SITE_OTHER): Payer: BLUE CROSS/BLUE SHIELD

## 2015-10-12 VITALS — BP 118/82 | HR 54 | Temp 97.7°F | Resp 16 | Ht 67.0 in | Wt 162.0 lb

## 2015-10-12 DIAGNOSIS — R233 Spontaneous ecchymoses: Secondary | ICD-10-CM

## 2015-10-12 DIAGNOSIS — T148 Other injury of unspecified body region: Secondary | ICD-10-CM | POA: Diagnosis not present

## 2015-10-12 DIAGNOSIS — W57XXXA Bitten or stung by nonvenomous insect and other nonvenomous arthropods, initial encounter: Secondary | ICD-10-CM | POA: Diagnosis not present

## 2015-10-12 DIAGNOSIS — M5432 Sciatica, left side: Secondary | ICD-10-CM

## 2015-10-12 DIAGNOSIS — Z1159 Encounter for screening for other viral diseases: Secondary | ICD-10-CM

## 2015-10-12 DIAGNOSIS — R238 Other skin changes: Secondary | ICD-10-CM | POA: Diagnosis not present

## 2015-10-12 DIAGNOSIS — M5430 Sciatica, unspecified side: Secondary | ICD-10-CM | POA: Insufficient documentation

## 2015-10-12 DIAGNOSIS — E785 Hyperlipidemia, unspecified: Secondary | ICD-10-CM

## 2015-10-12 DIAGNOSIS — Z0000 Encounter for general adult medical examination without abnormal findings: Secondary | ICD-10-CM

## 2015-10-12 LAB — CBC WITH DIFFERENTIAL/PLATELET
BASOS PCT: 0.6 % (ref 0.0–3.0)
Basophils Absolute: 0 10*3/uL (ref 0.0–0.1)
EOS ABS: 0.1 10*3/uL (ref 0.0–0.7)
EOS PCT: 2.9 % (ref 0.0–5.0)
HCT: 40.3 % (ref 36.0–46.0)
HEMOGLOBIN: 14.1 g/dL (ref 12.0–15.0)
LYMPHS ABS: 1.6 10*3/uL (ref 0.7–4.0)
Lymphocytes Relative: 34.8 % (ref 12.0–46.0)
MCHC: 35.1 g/dL (ref 30.0–36.0)
MCV: 87.3 fl (ref 78.0–100.0)
MONO ABS: 0.4 10*3/uL (ref 0.1–1.0)
Monocytes Relative: 8.6 % (ref 3.0–12.0)
NEUTROS PCT: 53.1 % (ref 43.0–77.0)
Neutro Abs: 2.4 10*3/uL (ref 1.4–7.7)
PLATELETS: 205 10*3/uL (ref 150.0–400.0)
RBC: 4.61 Mil/uL (ref 3.87–5.11)
RDW: 13.8 % (ref 11.5–15.5)
WBC: 4.5 10*3/uL (ref 4.0–10.5)

## 2015-10-12 LAB — LIPID PANEL
CHOLESTEROL: 242 mg/dL — AB (ref 0–200)
HDL: 72.9 mg/dL (ref >39.00)
LDL CALC: 151 mg/dL — AB (ref 0–99)
NonHDL: 168.74
TRIGLYCERIDES: 88 mg/dL (ref 0.0–149.0)
Total CHOL/HDL Ratio: 3
VLDL: 17.6 mg/dL (ref 0.0–40.0)

## 2015-10-12 LAB — COMPREHENSIVE METABOLIC PANEL
ALBUMIN: 4.8 g/dL (ref 3.5–5.2)
ALK PHOS: 29 U/L — AB (ref 39–117)
ALT: 21 U/L (ref 0–35)
AST: 21 U/L (ref 0–37)
BUN: 17 mg/dL (ref 6–23)
CALCIUM: 10.2 mg/dL (ref 8.4–10.5)
CO2: 30 mEq/L (ref 19–32)
Chloride: 103 mEq/L (ref 96–112)
Creatinine, Ser: 0.97 mg/dL (ref 0.40–1.20)
GFR: 63 mL/min (ref >60.00)
Glucose, Bld: 101 mg/dL — ABNORMAL HIGH (ref 70–99)
POTASSIUM: 4.4 meq/L (ref 3.5–5.1)
SODIUM: 142 meq/L (ref 135–145)
TOTAL PROTEIN: 7.6 g/dL (ref 6.0–8.3)
Total Bilirubin: 0.6 mg/dL (ref 0.2–1.2)

## 2015-10-12 LAB — IRON: IRON: 111 ug/dL (ref 42–145)

## 2015-10-12 LAB — PROTIME-INR
INR: 1 ratio (ref 0.8–1.0)
PROTHROMBIN TIME: 10.5 s (ref 9.6–13.1)

## 2015-10-12 LAB — TSH: TSH: 3.55 u[IU]/mL (ref 0.35–4.50)

## 2015-10-12 LAB — FERRITIN: FERRITIN: 59.6 ng/mL (ref 10.0–291.0)

## 2015-10-12 MED ORDER — MAGNESIUM 500 MG PO TABS: ORAL_TABLET | ORAL | Status: DC

## 2015-10-12 MED ORDER — CETIRIZINE HCL 10 MG PO TABS: 10.0000 mg | ORAL_TABLET | Freq: Every day | ORAL | 11 refills | Status: DC

## 2015-10-12 MED ORDER — POLYETHYLENE GLYCOL 3350 17 GM/SCOOP PO POWD: 17.0000 g | Freq: Two times a day (BID) | ORAL | 1 refills | Status: DC | PRN

## 2015-10-12 NOTE — Assessment & Plan Note (Signed)
Two tick bites in last month No concerning symptoms, but still concern with lyme disease Will check lyme disease Discussed prevention Reviewed concerning symptoms

## 2015-10-12 NOTE — Progress Notes (Signed)
Pre visit review using our clinic review tool, if applicable. No additional management support is needed unless otherwise documented below in the visit note. 

## 2015-10-12 NOTE — Assessment & Plan Note (Signed)
Arms and legs, related to trauma Her supplements may be contributing Will check basic labs, but I expect them to be normal Continue to monitor

## 2015-10-12 NOTE — Assessment & Plan Note (Signed)
Getting better Continue aleve - take regularly for two weeks Exercise/stretch Avoid activities that increase symptoms - her symptom improved until she does certain activities and then worsen Call or return is symptoms persist or worsen - consider PT or referral for Dr Tamala Julian

## 2015-10-12 NOTE — Assessment & Plan Note (Signed)
Check lipid panel Exercises regularly

## 2015-10-13 LAB — HEPATITIS C ANTIBODY: HCV Ab: NEGATIVE

## 2015-10-13 LAB — LYME AB/WESTERN BLOT REFLEX

## 2015-10-17 ENCOUNTER — Encounter: Payer: Self-pay | Admitting: Internal Medicine

## 2015-10-21 ENCOUNTER — Encounter: Payer: Self-pay | Admitting: Internal Medicine

## 2015-11-11 ENCOUNTER — Encounter: Payer: Self-pay | Admitting: Obstetrics & Gynecology

## 2015-11-11 DIAGNOSIS — Z1151 Encounter for screening for human papillomavirus (HPV): Secondary | ICD-10-CM | POA: Diagnosis not present

## 2015-11-11 DIAGNOSIS — Z01419 Encounter for gynecological examination (general) (routine) without abnormal findings: Secondary | ICD-10-CM | POA: Diagnosis not present

## 2015-11-11 DIAGNOSIS — Z6826 Body mass index (BMI) 26.0-26.9, adult: Secondary | ICD-10-CM | POA: Diagnosis not present

## 2015-11-11 DIAGNOSIS — Z1231 Encounter for screening mammogram for malignant neoplasm of breast: Secondary | ICD-10-CM | POA: Diagnosis not present

## 2015-11-25 DIAGNOSIS — L570 Actinic keratosis: Secondary | ICD-10-CM | POA: Diagnosis not present

## 2015-11-25 DIAGNOSIS — Z411 Encounter for cosmetic surgery: Secondary | ICD-10-CM | POA: Diagnosis not present

## 2015-11-25 DIAGNOSIS — L821 Other seborrheic keratosis: Secondary | ICD-10-CM | POA: Diagnosis not present

## 2016-02-03 DIAGNOSIS — J209 Acute bronchitis, unspecified: Secondary | ICD-10-CM | POA: Diagnosis not present

## 2016-02-17 ENCOUNTER — Encounter (HOSPITAL_COMMUNITY): Payer: Self-pay | Admitting: Emergency Medicine

## 2016-02-17 ENCOUNTER — Emergency Department (HOSPITAL_COMMUNITY): Payer: BLUE CROSS/BLUE SHIELD

## 2016-02-17 ENCOUNTER — Encounter: Payer: Self-pay | Admitting: Internal Medicine

## 2016-02-17 ENCOUNTER — Emergency Department (HOSPITAL_COMMUNITY)
Admission: EM | Admit: 2016-02-17 | Discharge: 2016-02-17 | Disposition: A | Discharge status: Home / Self Care | Payer: BLUE CROSS/BLUE SHIELD | Attending: Emergency Medicine | Admitting: Emergency Medicine

## 2016-02-17 ENCOUNTER — Telehealth: Payer: Self-pay | Admitting: Emergency Medicine

## 2016-02-17 DIAGNOSIS — R0602 Shortness of breath: Secondary | ICD-10-CM | POA: Diagnosis not present

## 2016-02-17 DIAGNOSIS — Z79899 Other long term (current) drug therapy: Secondary | ICD-10-CM | POA: Diagnosis not present

## 2016-02-17 LAB — COMPREHENSIVE METABOLIC PANEL
ALT: 18 U/L (ref 14–54)
ANION GAP: 6 (ref 5–15)
AST: 21 U/L (ref 15–41)
Albumin: 4.2 g/dL (ref 3.5–5.0)
Alkaline Phosphatase: 29 U/L — ABNORMAL LOW (ref 38–126)
BILIRUBIN TOTAL: 0.7 mg/dL (ref 0.3–1.2)
BUN: 18 mg/dL (ref 6–20)
CO2: 29 mmol/L (ref 22–32)
Calcium: 8.9 mg/dL (ref 8.9–10.3)
Chloride: 105 mmol/L (ref 101–111)
Creatinine, Ser: 1.1 mg/dL — ABNORMAL HIGH (ref 0.44–1.00)
GFR, EST NON AFRICAN AMERICAN: 55 mL/min — AB (ref >60)
Glucose, Bld: 86 mg/dL (ref 65–99)
POTASSIUM: 3.7 mmol/L (ref 3.5–5.1)
Sodium: 140 mmol/L (ref 135–145)
TOTAL PROTEIN: 6.9 g/dL (ref 6.5–8.1)

## 2016-02-17 LAB — URINALYSIS, ROUTINE W REFLEX MICROSCOPIC
Bilirubin Urine: NEGATIVE
GLUCOSE, UA: NEGATIVE mg/dL
Hgb urine dipstick: NEGATIVE
KETONES UR: NEGATIVE mg/dL
LEUKOCYTES UA: NEGATIVE
NITRITE: NEGATIVE
PH: 8 (ref 5.0–8.0)
Protein, ur: NEGATIVE mg/dL
SPECIFIC GRAVITY, URINE: 1.013 (ref 1.005–1.030)

## 2016-02-17 LAB — CBC WITH DIFFERENTIAL/PLATELET
BASOS ABS: 0.1 10*3/uL (ref 0.0–0.1)
Basophils Relative: 1 %
EOS PCT: 1 %
Eosinophils Absolute: 0.1 10*3/uL (ref 0.0–0.7)
HCT: 35.9 % — ABNORMAL LOW (ref 36.0–46.0)
Hemoglobin: 12.4 g/dL (ref 12.0–15.0)
LYMPHS PCT: 21 %
Lymphs Abs: 1.4 10*3/uL (ref 0.7–4.0)
MCH: 30.3 pg (ref 26.0–34.0)
MCHC: 34.5 g/dL (ref 30.0–36.0)
MCV: 87.8 fL (ref 78.0–100.0)
Monocytes Absolute: 0.4 10*3/uL (ref 0.1–1.0)
Monocytes Relative: 6 %
NEUTROS PCT: 71 %
Neutro Abs: 4.5 10*3/uL (ref 1.7–7.7)
PLATELETS: 210 10*3/uL (ref 150–400)
RBC: 4.09 MIL/uL (ref 3.87–5.11)
RDW: 13.6 % (ref 11.5–15.5)
WBC: 6.4 10*3/uL (ref 4.0–10.5)

## 2016-02-17 MED ORDER — IOPAMIDOL (ISOVUE-370) INJECTION 76%
INTRAVENOUS | Status: AC
  Administered 2016-02-17: 100 mL
  Filled 2016-02-17: qty 100

## 2016-02-17 MED ORDER — SODIUM CHLORIDE 0.9 % IV BOLUS (SEPSIS)
1000.0000 mL | Freq: Once | INTRAVENOUS | Status: AC
  Administered 2016-02-17: 1000 mL via INTRAVENOUS

## 2016-02-17 NOTE — Discharge Instructions (Signed)
There was no evidence of a blood clot or other abnormalities on the lung CT scan today. Other than some evidence of minor dehydration, there were no abnormalities on the labs. Take extra care to stay well hydrated. Follow up with a primary care provider as soon as possible on this matter. It may be necessary to have your PCP refer you to cardiology or pulmonology for further testing. This may include tests such as cardiac stress test or pulmonary function tests.

## 2016-02-17 NOTE — Telephone Encounter (Signed)
We will not be able to get her in for the CT scan that she needs today --- she needs to go to the ED.

## 2016-02-17 NOTE — ED Provider Notes (Signed)
[facility] DEPT Provider Note   CSN: [id number] Arrival date & time: 02/17/16  1207     History   Chief Complaint Chief Complaint  Patient presents with  . Shortness of Breath    HPI Jean Pittman is a 55 y.o. female.  HPI   Jean Pittman is a 55 y.o. female, patient with no pertinent past medical history, presenting to the ED with the sensation that she can not take a deep breath accompanied by fatigue for the last three weeks. States, "It feels like a heaviness in the bottom of my chest." Also endorses intermittent pain behind her right shoulder blade, worse with coughing. Has had a nonproductive cough for the last three weeks. Intermittently exertional shortness of breath.  Treated for bronchitis with azithromycin. Has had 2 clear chest xrays. Sent here from her PCP for PE investigation.  Denies history of PE/DVT, Cancer, recent trauma, immobilization, leg swelling, hormone therapy, or smoking.  Denies N/V, fever/chills, orthopnea, chest pain, or any other complaints.  Past Medical History:  Diagnosis Date  . Dizziness   . Hyperlipidemia   . Tubular adenoma of colon 03/30/09   Dr Sharlett Iles    Patient Active Problem List   Diagnosis Date Noted  . Tick bite 10/12/2015  . Abnormal bruising 10/12/2015  . Sciatica 10/12/2015  . EKG abnormalities 02/15/2012  . ADENOMATOUS COLONIC POLYP 04/16/2010  . REACTIVE AIRWAY DISEASE 04/16/2010  . Hyperlipidemia 03/12/2009    Past Surgical History:  Procedure Laterality Date  . APPENDECTOMY     with 1st pregnancy  . BREAST BIOPSY     needle aspiration X1; resection X 1  . colonoscopy with polypectomy  03/30/2009   Dr Sharlett Iles  . G 3 P 3     3 C sections  . TUBAL LIGATION      OB History    No data available       Home Medications    Prior to Admission medications   Medication Sig Start Date End Date Taking? Authorizing Provider  albuterol (PROVENTIL HFA;VENTOLIN HFA) 108 (90 Base) MCG/ACT inhaler Inhale 1-2  puffs into the lungs every 4 (four) hours as needed for wheezing or shortness of breath.   Yes Historical Provider, MD  cetirizine (ZYRTEC) 10 MG tablet Take 1 tablet (10 mg total) by mouth daily. 10/12/15  Yes Binnie Rail, MD  fluticasone (FLONASE) 50 MCG/ACT nasal spray Place 1 spray into both nostrils daily.   Yes Historical Provider, MD  guaiFENesin-codeine (ROBITUSSIN AC) 100-10 MG/5ML syrup Take 5 mLs by mouth at bedtime.   Yes Historical Provider, MD  Magnesium 500 MG TABS Takes daily Patient taking differently: Take 500 mg by mouth daily. Takes daily 10/12/15  Yes Binnie Rail, MD  Multiple Vitamin (MULTIVITAMIN) tablet Take 1 tablet by mouth daily. Probiotic with MVI   Yes Historical Provider, MD  naproxen sodium (ANAPROX) 220 MG tablet Take 440 mg by mouth 2 (two) times daily with a meal.   Yes Historical Provider, MD  polyethylene glycol powder (GLYCOLAX/MIRALAX) powder Take 17 g by mouth 2 (two) times daily as needed. Patient not taking: Reported on 02/17/2016 10/12/15   Binnie Rail, MD    Family History Family History  Problem Relation Age of Onset  . Breast cancer Maternal Aunt   . Diabetes Maternal Aunt   . Stroke Maternal Aunt     ? . one M aunt;> 65  . Heart disease Neg Hx   . Colon cancer Neg Hx  Social History Social History  Substance Use Topics  . Smoking status: Never Smoker  . Smokeless tobacco: Never Used  . Alcohol use No     Allergies   Patient has no known allergies.   Review of Systems Review of Systems  Constitutional: Negative for chills, diaphoresis and fever.  Respiratory: Positive for cough and shortness of breath.   Cardiovascular: Negative for leg swelling.  Gastrointestinal: Negative for abdominal pain, nausea and vomiting.  Musculoskeletal: Positive for back pain.  All other systems reviewed and are negative.    Physical Exam Updated Vital Signs BP 134/73   Pulse 74   Temp 97.6 F (36.4 C)   Resp 16   Ht 5\' 7"  (1.702 m)    Wt 74.8 kg   LMP 07/01/2013   SpO2 100%   BMI 25.84 kg/m   Physical Exam  Constitutional: She appears well-developed and well-nourished. No distress.  HENT:  Head: Normocephalic and atraumatic.  Eyes: Conjunctivae are normal.  Neck: Neck supple.  Cardiovascular: Normal rate, regular rhythm, normal heart sounds and intact distal pulses.   Pulmonary/Chest: Breath sounds normal.  Speaks in full sentences, but is noted to have to pause every few sentences and tries to take a deep breath.  Abdominal: Soft. There is no tenderness. There is no guarding.  Musculoskeletal: She exhibits no edema.  Lymphadenopathy:    She has no cervical adenopathy.  Neurological: She is alert.  Skin: Skin is warm and dry. She is not diaphoretic.  Psychiatric: She has a normal mood and affect. Her behavior is normal.  Nursing note and vitals reviewed.    ED Treatments / Results  Labs (all labs ordered are listed, but only abnormal results are displayed) Labs Reviewed  URINALYSIS, ROUTINE W REFLEX MICROSCOPIC - Abnormal; Notable for the following:       Result Value   Color, Urine STRAW (*)    All other components within normal limits  COMPREHENSIVE METABOLIC PANEL - Abnormal; Notable for the following:    Creatinine, Ser 1.10 (*)    Alkaline Phosphatase 29 (*)    GFR calc non Af Amer 55 (*)    All other components within normal limits  CBC WITH DIFFERENTIAL/PLATELET - Abnormal; Notable for the following:    HCT 35.9 (*)    All other components within normal limits    EKG  EKG Interpretation None       Radiology Ct Angio Chest Pe W Or Wo Contrast  Result Date: 02/17/2016 CLINICAL DATA:  Cough and shortness of breath for 2 months. Recent travel by plane and car to [place]. History of hyperlipidemia, reactive airway disease. EXAM: CT ANGIOGRAPHY CHEST WITH CONTRAST TECHNIQUE: Multidetector CT imaging of the chest was performed using the standard protocol during bolus administration of  intravenous contrast. Multiplanar CT image reconstructions and MIPs were obtained to evaluate the vascular anatomy. CONTRAST:  100 cc Isovue 370 COMPARISON:  Chest radiograph June 24, 2014 FINDINGS: Moderate respiratory motion degraded examination. CARDIOVASCULAR: Adequate contrast opacification of the pulmonary artery's. Main pulmonary artery is not enlarged. No pulmonary arterial filling defects to the level of the segmental branches, respiratory motion limits assessment of distal pulmonary emboli. Heart size is normal, no right heart strain. No pericardial effusions. Thoracic aorta is normal course and caliber, unremarkable. MEDIASTINUM/NODES: No lymphadenopathy by CT size criteria. LUNGS/PLEURA: Tracheobronchial tree is patent, no pneumothorax. No pleural effusions, focal consolidations, pulmonary nodules or masses. Mild dependent atelectasis. UPPER ABDOMEN: Included view of the abdomen is nonacute. Two subcentimeter  cysts versus hemangiomas within the included liver, limited assessment due to face and respiratory motion. MUSCULOSKELETAL: Visualized soft tissues and included osseous structures are nonacute. subcentimeter LEFT shoulder loose body. Review of the MIP images confirms the above findings. IMPRESSION: No acute pulmonary embolism or acute cardiopulmonary process on this moderately respiratory motion degraded examination. Loose body LEFT shoulder. Electronically Signed   By: Elon Alas M.D.   On: 02/17/2016 14:41    Procedures Procedures (including critical care time)  Medications Ordered in ED Medications  sodium chloride 0.9 % bolus 1,000 mL (0 mLs Intravenous Stopped 02/17/16 1604)  iopamidol (ISOVUE-370) 76 % injection (100 mLs  Contrast Given 02/17/16 1415)     Initial Impression / Assessment and Plan / ED Course  I have reviewed the triage vital signs and the nursing notes.  Pertinent labs & imaging results that were available during my care of the patient were reviewed by  me and considered in my medical decision making (see chart for details).  Clinical Course     Patient presents from her PCP office for PE rule out. Patient is nontoxic appearing, afebrile, not tachycardic, not tachypneic, not hypotensive, maintains SPO2 of 100% on room air, and is in no apparent distress. No PE on CT. Labs are reassuring with small increase in creatinine as the only abnormality, likely due to dehydration. Patient ambulated without difficulty, shortness of breath, assistance, or drop in SPO2. She may need a referral to cardiology or pulmonology for further assessment such as cardiac stress test or pulmonary function testing. This will need to be coordinated through her PCP. Return precautions discussed. Patient voiced understanding of all instructions and is comfortable with discharge.  Findings and plan of care discussed with Gwenyth Allegra Tegeler, MD.  Vitals:   02/17/16 1212 02/17/16 1215 02/17/16 1604  BP: 134/73  113/62  Pulse: 74  70  Resp: 16  16  Temp: 97.6 F (36.4 C)    SpO2: 100%  100%  Weight:  74.8 kg   Height:  5\' 7"  (1.702 m)      Final Clinical Impressions(s) / ED Diagnoses   Final diagnoses:  SOB (shortness of breath)    New Prescriptions Discharge Medication List as of 02/17/2016  3:41 PM       Lorayne Bender, PA-C 02/17/16 2006    Shawn C Joy, PA-C 02/17/16 2007    Gwenyth Allegra Tegeler, MD 02/18/16 [phone]

## 2016-02-17 NOTE — Telephone Encounter (Signed)
Pt came into office, has been informed.

## 2016-02-17 NOTE — ED Triage Notes (Addendum)
Pt states she went to walk in clinic 3 weeks ago thinking she has bronchitis due to the inability to take a deep breath. Pt went through round of antibiotics with no relief. Pt had 2 chest xrays done and ruled out pneumonia. Sent her for further evaluation to rule out pulmonary embolism. Pt denies pain. Speaking in full sentences.

## 2016-02-17 NOTE — Telephone Encounter (Signed)
Pt called and stated she went to the [facility] in [place]. They did 2 chest xrays and ruled out pneumonia. They are wanting her to go to the ER for a CT scan to rule out a blood clot. She is having trouble getting a deep breathe. She wants to know if she can come in to be seen her an sent down for a CT scan if needed instead of going to the ER. Please advise thanks.

## 2016-03-22 ENCOUNTER — Other Ambulatory Visit (INDEPENDENT_AMBULATORY_CARE_PROVIDER_SITE_OTHER): Payer: BLUE CROSS/BLUE SHIELD

## 2016-03-22 ENCOUNTER — Encounter: Payer: Self-pay | Admitting: Internal Medicine

## 2016-03-22 ENCOUNTER — Ambulatory Visit (INDEPENDENT_AMBULATORY_CARE_PROVIDER_SITE_OTHER): Payer: BLUE CROSS/BLUE SHIELD | Admitting: Internal Medicine

## 2016-03-22 VITALS — BP 126/86 | HR 68 | Temp 97.9°F | Resp 16 | Wt 167.0 lb

## 2016-03-22 DIAGNOSIS — R0602 Shortness of breath: Secondary | ICD-10-CM | POA: Insufficient documentation

## 2016-03-22 DIAGNOSIS — R05 Cough: Secondary | ICD-10-CM | POA: Diagnosis not present

## 2016-03-22 DIAGNOSIS — R053 Chronic cough: Secondary | ICD-10-CM | POA: Insufficient documentation

## 2016-03-22 DIAGNOSIS — R059 Cough, unspecified: Secondary | ICD-10-CM

## 2016-03-22 LAB — COMPREHENSIVE METABOLIC PANEL
ALK PHOS: 30 U/L — AB (ref 39–117)
ALT: 16 U/L (ref 0–35)
AST: 17 U/L (ref 0–37)
Albumin: 4.6 g/dL (ref 3.5–5.2)
BILIRUBIN TOTAL: 0.4 mg/dL (ref 0.2–1.2)
BUN: 16 mg/dL (ref 6–23)
CO2: 32 meq/L (ref 19–32)
CREATININE: 0.98 mg/dL (ref 0.40–1.20)
Calcium: 10 mg/dL (ref 8.4–10.5)
Chloride: 102 mEq/L (ref 96–112)
GFR: 62.16 mL/min (ref >60.00)
GLUCOSE: 96 mg/dL (ref 70–99)
Potassium: 4.8 mEq/L (ref 3.5–5.1)
Sodium: 139 mEq/L (ref 135–145)
TOTAL PROTEIN: 7.8 g/dL (ref 6.0–8.3)

## 2016-03-22 LAB — CBC WITH DIFFERENTIAL/PLATELET
BASOS ABS: 0 10*3/uL (ref 0.0–0.1)
BASOS PCT: 0.4 % (ref 0.0–3.0)
EOS ABS: 0.1 10*3/uL (ref 0.0–0.7)
Eosinophils Relative: 1.2 % (ref 0.0–5.0)
HEMATOCRIT: 40.2 % (ref 36.0–46.0)
Hemoglobin: 14 g/dL (ref 12.0–15.0)
LYMPHS ABS: 2.1 10*3/uL (ref 0.7–4.0)
LYMPHS PCT: 27.6 % (ref 12.0–46.0)
MCHC: 34.8 g/dL (ref 30.0–36.0)
MCV: 88.1 fl (ref 78.0–100.0)
MONO ABS: 0.4 10*3/uL (ref 0.1–1.0)
Monocytes Relative: 5.6 % (ref 3.0–12.0)
NEUTROS ABS: 4.9 10*3/uL (ref 1.4–7.7)
NEUTROS PCT: 65.2 % (ref 43.0–77.0)
PLATELETS: 224 10*3/uL (ref 150.0–400.0)
RBC: 4.56 Mil/uL (ref 3.87–5.11)
RDW: 14.1 % (ref 11.5–15.5)
WBC: 7.6 10*3/uL (ref 4.0–10.5)

## 2016-03-22 LAB — TSH: TSH: 2.42 u[IU]/mL (ref 0.35–4.50)

## 2016-03-22 LAB — MAGNESIUM: Magnesium: 2.1 mg/dL (ref 1.5–2.5)

## 2016-03-22 LAB — POCT EXHALED NITRIC OXIDE: FENO LEVEL (PPB): 7

## 2016-03-22 MED ORDER — OMEPRAZOLE 40 MG PO CPDR: 40.0000 mg | DELAYED_RELEASE_CAPSULE | Freq: Every day | ORAL | 3 refills | Status: DC

## 2016-03-22 NOTE — Progress Notes (Signed)
Pre visit review using our clinic review tool, if applicable. No additional management support is needed unless otherwise documented below in the visit note. 

## 2016-03-22 NOTE — Assessment & Plan Note (Signed)
Present for weeks, dry in nature, associated with SOB No evidence of active infection FENO 7 - not likely asthma ? Silent GERD, possible allergies ? Vocal cord dysfunction Will refer to pulm and ent for further evaluation Continue albuterol - will hold off on additional treatment for now

## 2016-03-22 NOTE — Assessment & Plan Note (Signed)
Check labs  - she is concerned it may be from high doses of magnesium she is taking - this seems unlikely FENO is 7 ? Related to GERD, allergies, VCD, less likely asthma Will refer to pulm. Asthma CXR and Ct chest reviewed - normal - no additional imaging today

## 2016-03-22 NOTE — Progress Notes (Signed)
Subjective:    Patient ID: Jean Pittman, female    DOB: 10/29/1960, 56 y.o.   MRN: [id number]  HPI She is here for follow up from the ED.  She went to the ED 02/17/16 for SOB.  It was going on for three-four  weeks and was associated with fatigue, heaviness in the bottom of her chest, intermittent pain behind her shoulder blade that was worse with coughing.  She had a non productive cough for three weeks as well.  She first went to urgent care the end of November.  A cxr was normal and was diagnosed with bronchitis and treated with a steroid injection, zpak and an inhaler.  She had slight improvement in the cough, but still had difficulty taking a deep breath.  She went back to urgent care and there was concern over a possible PE and she was sent to the ED.  She had a Ct of her chest and it was negative for a PE.  She has had some reactive airway disease with URI's but has never been diagnosed with asthma.  Her current symptoms include:    SOB: Normal breathing at rest is normal.  Trying to take a deep breath at rest her central chest feels tight. Using the proair helps, but does not take it away.  When she sleeps she denies feeling that she can not breath.  With exercise she feels she is unable to get a deep breath.  It is worse with heat.  She is still able to exercise to her same extent.    Cough: she coughs as soon as she lays down at night - either laying on the right or left she gets a tickle in her throat and will start to cough.  It feels like it is coming from deep in her chest and it is wet.  She sometimes brings up phelgm - it is not discolored.    Activity makes her symptoms worse.    She denies allergies.  She denies changes in her environment.  She denies GERD.  Medications and allergies reviewed with patient and updated if appropriate.  Patient Active Problem List   Diagnosis Date Noted  . Tick bite 10/12/2015  . Abnormal bruising 10/12/2015  . Sciatica 10/12/2015  . EKG  abnormalities 02/15/2012  . ADENOMATOUS COLONIC POLYP 04/16/2010  . REACTIVE AIRWAY DISEASE 04/16/2010  . Hyperlipidemia 03/12/2009    Current Outpatient Prescriptions on File Prior to Visit  Medication Sig Dispense Refill  . albuterol (PROVENTIL HFA;VENTOLIN HFA) 108 (90 Base) MCG/ACT inhaler Inhale 1-2 puffs into the lungs every 4 (four) hours as needed for wheezing or shortness of breath.    . cetirizine (ZYRTEC) 10 MG tablet Take 1 tablet (10 mg total) by mouth daily. 30 tablet 11  . fluticasone (FLONASE) 50 MCG/ACT nasal spray Place 1 spray into both nostrils daily.    . Magnesium 500 MG TABS Takes daily (Patient taking differently: Take 500 mg by mouth daily. Takes daily)    . naproxen sodium (ANAPROX) 220 MG tablet Take 440 mg by mouth 2 (two) times daily with a meal.    . Multiple Vitamin (MULTIVITAMIN) tablet Take 1 tablet by mouth daily. Probiotic with MVI     No current facility-administered medications on file prior to visit.     Past Medical History:  Diagnosis Date  . Dizziness   . Hyperlipidemia   . Tubular adenoma of colon 03/30/09   Dr Sharlett Iles    Past Surgical History:  Procedure Laterality Date  . APPENDECTOMY     with 1st pregnancy  . BREAST BIOPSY     needle aspiration X1; resection X 1  . colonoscopy with polypectomy  03/30/2009   Dr Sharlett Iles  . G 3 P 3     3 C sections  . TUBAL LIGATION      Social History   Social History  . Marital status: Married    Spouse name: N/A  . Number of children: N/A  . Years of education: N/A   Social History Main Topics  . Smoking status: Never Smoker  . Smokeless tobacco: Never Used  . Alcohol use No  . Drug use: No  . Sexual activity: Not Asked   Other Topics Concern  . None   Social History Narrative   Exercises regularly    Family History  Problem Relation Age of Onset  . Breast cancer Maternal Aunt   . Diabetes Maternal Aunt   . Stroke Maternal Aunt     ? . one M aunt;> 65  . Heart disease  Neg Hx   . Colon cancer Neg Hx     Review of Systems  Constitutional: Negative for chills and fever.  HENT: Positive for congestion (minimal). Negative for sinus pain and sore throat.   Respiratory: Positive for cough, chest tightness and shortness of breath. Negative for wheezing.   Cardiovascular: Negative for chest pain and palpitations.  Gastrointestinal: Negative for abdominal pain, constipation, diarrhea and nausea.       No gerd  Musculoskeletal: Negative for back pain (no pain by shoulder pain) and myalgias.  Neurological: Negative for light-headedness and headaches.       Objective:   Vitals:   03/22/16 1528  BP: 126/86  Pulse: 68  Resp: 16  Temp: 97.9 F (36.6 C)   Filed Weights   03/22/16 1528  Weight: 167 lb (75.8 kg)   Body mass index is 26.16 kg/m.  Wt Readings from Last 3 Encounters:  03/22/16 167 lb (75.8 kg)  02/17/16 165 lb (74.8 kg)  10/12/15 162 lb (73.5 kg)     Physical Exam GENERAL APPEARANCE: Appears stated age, well appearing, NAD EYES: conjunctiva clear, no icterus HEENT: bilateral tympanic membranes and ear canals normal, oropharynx with no erythema, no thyromegaly, trachea midline, no cervical or supraclavicular lymphadenopathy LUNGS: Clear to auscultation without wheeze or crackles, unlabored breathing, good air entry bilaterally HEART: Normal S1,S2 without murmurs EXTREMITIES: Without clubbing, cyanosis, or edema       Assessment & Plan:    See Problem List for Assessment and Plan of chronic medical problems.

## 2016-03-22 NOTE — Patient Instructions (Addendum)
Your test shows you likely do not have asthma.   Test(s) ordered today. Your results will be released to [place] (or called to you) after review, usually within 72hours after test completion. If any changes need to be made, you will be notified at that same time.   Medications reviewed and updated.  Changes include start omeprazole for possible heartburn.  Take 30 minutes prior to a meal.   Your prescription(s) have been submitted to your pharmacy. Please take as directed and contact our office if you believe you are having problem(s) with the medication(s).  A referral was ordered for pulmonary and ENT.

## 2016-04-06 ENCOUNTER — Encounter: Payer: Self-pay | Admitting: Internal Medicine

## 2016-04-06 DIAGNOSIS — R490 Dysphonia: Secondary | ICD-10-CM | POA: Diagnosis not present

## 2016-04-06 DIAGNOSIS — R05 Cough: Secondary | ICD-10-CM | POA: Diagnosis not present

## 2016-04-18 ENCOUNTER — Encounter: Payer: Self-pay | Admitting: Internal Medicine

## 2016-06-27 DIAGNOSIS — H10413 Chronic giant papillary conjunctivitis, bilateral: Secondary | ICD-10-CM | POA: Diagnosis not present

## 2016-11-28 DIAGNOSIS — Z1231 Encounter for screening mammogram for malignant neoplasm of breast: Secondary | ICD-10-CM | POA: Diagnosis not present

## 2016-12-22 ENCOUNTER — Encounter: Payer: Self-pay | Admitting: Obstetrics & Gynecology

## 2016-12-22 ENCOUNTER — Ambulatory Visit (INDEPENDENT_AMBULATORY_CARE_PROVIDER_SITE_OTHER): Payer: BLUE CROSS/BLUE SHIELD | Admitting: Obstetrics & Gynecology

## 2016-12-22 VITALS — BP 124/86 | Ht 66.5 in | Wt 160.0 lb

## 2016-12-22 DIAGNOSIS — Z780 Asymptomatic menopausal state: Secondary | ICD-10-CM

## 2016-12-22 DIAGNOSIS — Z01419 Encounter for gynecological examination (general) (routine) without abnormal findings: Secondary | ICD-10-CM

## 2016-12-22 DIAGNOSIS — Z1151 Encounter for screening for human papillomavirus (HPV): Secondary | ICD-10-CM | POA: Diagnosis not present

## 2016-12-22 NOTE — Patient Instructions (Signed)
1. Encounter for routine gynecological examination with Papanicolaou smear of cervix Normal Gyn exam.  Pap/HPV HR done.  If no TZ cells re narrow External Os, will repeat next year with dilation.  Breasts wnl.  Mammo 3D normal 11/2016.  2. Menopause present No HRT, no PMB.  Well tolerated.  On Vit D/Ca++ supplement/Weight bearing physical activity.  Schedule Bone Density. - DG Bone Density; Future  Jean Pittman, it was a pleasure seeing you today!  I will inform you of your results as soon as available.     Health Maintenance for Postmenopausal Women Menopause is a normal process in which your reproductive ability comes to an end. This process happens gradually over a span of months to years, usually between the ages of 31 and 5. Menopause is complete when you have missed 12 consecutive menstrual periods. It is important to talk with your health care provider about some of the most common conditions that affect postmenopausal women, such as heart disease, cancer, and bone loss (osteoporosis). Adopting a healthy lifestyle and getting preventive care can help to promote your health and wellness. Those actions can also lower your chances of developing some of these common conditions. What should I know about menopause? During menopause, you may experience a number of symptoms, such as:  Moderate-to-severe hot flashes.  Night sweats.  Decrease in sex drive.  Mood swings.  Headaches.  Tiredness.  Irritability.  Memory problems.  Insomnia.  Choosing to treat or not to treat menopausal changes is an individual decision that you make with your health care provider. What should I know about hormone replacement therapy and supplements? Hormone therapy products are effective for treating symptoms that are associated with menopause, such as hot flashes and night sweats. Hormone replacement carries certain risks, especially as you become older. If you are thinking about using estrogen or estrogen  with progestin treatments, discuss the benefits and risks with your health care provider. What should I know about heart disease and stroke? Heart disease, heart attack, and stroke become more likely as you age. This may be due, in part, to the hormonal changes that your body experiences during menopause. These can affect how your body processes dietary fats, triglycerides, and cholesterol. Heart attack and stroke are both medical emergencies. There are many things that you can do to help prevent heart disease and stroke:  Have your blood pressure checked at least every 1-2 years. High blood pressure causes heart disease and increases the risk of stroke.  If you are 2-50 years old, ask your health care provider if you should take aspirin to prevent a heart attack or a stroke.  Do not use any tobacco products, including cigarettes, chewing tobacco, or electronic cigarettes. If you need help quitting, ask your health care provider.  It is important to eat a healthy diet and maintain a healthy weight. ? Be sure to include plenty of vegetables, fruits, low-fat dairy products, and lean protein. ? Avoid eating foods that are high in solid fats, added sugars, or salt (sodium).  Get regular exercise. This is one of the most important things that you can do for your health. ? Try to exercise for at least 150 minutes each week. The type of exercise that you do should increase your heart rate and make you sweat. This is known as moderate-intensity exercise. ? Try to do strengthening exercises at least twice each week. Do these in addition to the moderate-intensity exercise.  Know your numbers.Ask your health care provider to check  your cholesterol and your blood glucose. Continue to have your blood tested as directed by your health care provider.  What should I know about cancer screening? There are several types of cancer. Take the following steps to reduce your risk and to catch any cancer development  as early as possible. Breast Cancer  Practice breast self-awareness. ? This means understanding how your breasts normally appear and feel. ? It also means doing regular breast self-exams. Let your health care provider know about any changes, no matter how small.  If you are 20 or older, have a clinician do a breast exam (clinical breast exam or CBE) every year. Depending on your age, family history, and medical history, it may be recommended that you also have a yearly breast X-ray (mammogram).  If you have a family history of breast cancer, talk with your health care provider about genetic screening.  If you are at high risk for breast cancer, talk with your health care provider about having an MRI and a mammogram every year.  Breast cancer (BRCA) gene test is recommended for women who have family members with BRCA-related cancers. Results of the assessment will determine the need for genetic counseling and BRCA1 and for BRCA2 testing. BRCA-related cancers include these types: ? Breast. This occurs in males or females. ? Ovarian. ? Tubal. This may also be called fallopian tube cancer. ? Cancer of the abdominal or pelvic lining (peritoneal cancer). ? Prostate. ? Pancreatic.  Cervical, Uterine, and Ovarian Cancer Your health care provider may recommend that you be screened regularly for cancer of the pelvic organs. These include your ovaries, uterus, and vagina. This screening involves a pelvic exam, which includes checking for microscopic changes to the surface of your cervix (Pap test).  For women ages 21-65, health care providers may recommend a pelvic exam and a Pap test every three years. For women ages 76-65, they may recommend the Pap test and pelvic exam, combined with testing for human papilloma virus (HPV), every five years. Some types of HPV increase your risk of cervical cancer. Testing for HPV may also be done on women of any age who have unclear Pap test results.  Other health  care providers may not recommend any screening for nonpregnant women who are considered low risk for pelvic cancer and have no symptoms. Ask your health care provider if a screening pelvic exam is right for you.  If you have had past treatment for cervical cancer or a condition that could lead to cancer, you need Pap tests and screening for cancer for at least 20 years after your treatment. If Pap tests have been discontinued for you, your risk factors (such as having a new sexual partner) need to be reassessed to determine if you should start having screenings again. Some women have medical problems that increase the chance of getting cervical cancer. In these cases, your health care provider may recommend that you have screening and Pap tests more often.  If you have a family history of uterine cancer or ovarian cancer, talk with your health care provider about genetic screening.  If you have vaginal bleeding after reaching menopause, tell your health care provider.  There are currently no reliable tests available to screen for ovarian cancer.  Lung Cancer Lung cancer screening is recommended for adults 105-18 years old who are at high risk for lung cancer because of a history of smoking. A yearly low-dose CT scan of the lungs is recommended if you:  Currently smoke.  Have a history of at least 30 pack-years of smoking and you currently smoke or have quit within the past 15 years. A pack-year is smoking an average of one pack of cigarettes per day for one year.  Yearly screening should:  Continue until it has been 15 years since you quit.  Stop if you develop a health problem that would prevent you from having lung cancer treatment.  Colorectal Cancer  This type of cancer can be detected and can often be prevented.  Routine colorectal cancer screening usually begins at age 47 and continues through age 42.  If you have risk factors for colon cancer, your health care provider may  recommend that you be screened at an earlier age.  If you have a family history of colorectal cancer, talk with your health care provider about genetic screening.  Your health care provider may also recommend using home test kits to check for hidden blood in your stool.  A small camera at the end of a tube can be used to examine your colon directly (sigmoidoscopy or colonoscopy). This is done to check for the earliest forms of colorectal cancer.  Direct examination of the colon should be repeated every 5-10 years until age 38. However, if early forms of precancerous polyps or small growths are found or if you have a family history or genetic risk for colorectal cancer, you may need to be screened more often.  Skin Cancer  Check your skin from head to toe regularly.  Monitor any moles. Be sure to tell your health care provider: ? About any new moles or changes in moles, especially if there is a change in a mole's shape or color. ? If you have a mole that is larger than the size of a pencil eraser.  If any of your family members has a history of skin cancer, especially at a young age, talk with your health care provider about genetic screening.  Always use sunscreen. Apply sunscreen liberally and repeatedly throughout the day.  Whenever you are outside, protect yourself by wearing long sleeves, pants, a wide-brimmed hat, and sunglasses.  What should I know about osteoporosis? Osteoporosis is a condition in which bone destruction happens more quickly than new bone creation. After menopause, you may be at an increased risk for osteoporosis. To help prevent osteoporosis or the bone fractures that can happen because of osteoporosis, the following is recommended:  If you are 59-18 years old, get at least 1,000 mg of calcium and at least 600 mg of vitamin D per day.  If you are older than age 17 but younger than age 30, get at least 1,200 mg of calcium and at least 600 mg of vitamin D per  day.  If you are older than age 20, get at least 1,200 mg of calcium and at least 800 mg of vitamin D per day.  Smoking and excessive alcohol intake increase the risk of osteoporosis. Eat foods that are rich in calcium and vitamin D, and do weight-bearing exercises several times each week as directed by your health care provider. What should I know about how menopause affects my mental health? Depression may occur at any age, but it is more common as you become older. Common symptoms of depression include:  Low or sad mood.  Changes in sleep patterns.  Changes in appetite or eating patterns.  Feeling an overall lack of motivation or enjoyment of activities that you previously enjoyed.  Frequent crying spells.  Talk with your  health care provider if you think that you are experiencing depression. What should I know about immunizations? It is important that you get and maintain your immunizations. These include:  Tetanus, diphtheria, and pertussis (Tdap) booster vaccine.  Influenza every year before the flu season begins.  Pneumonia vaccine.  Shingles vaccine.  Your health care provider may also recommend other immunizations. This information is not intended to replace advice given to you by your health care provider. Make sure you discuss any questions you have with your health care provider. Document Released: 04/08/2005 Document Revised: 09/04/2015 Document Reviewed: 11/18/2014 Elsevier Interactive Patient Education  2018 [employer].

## 2016-12-22 NOTE — Progress Notes (Signed)
Jean Pittman 1960/12/08 [id number]   History:    56 y.o. G3P3L3 Married.  RP:  Established patient presenting for annual gyn exam   HPI:  Menopause.  No HRT.  No PMB.  No pelvic pain.  Sexually active, but no penetration, re erectile dysfunction.  Breasts wnl.  Mictions/BMs wnl.  Recurrent Bronchitis, currently having a cough.  In depth investigation negative so far.  BMI 25.44, lost 6 Lbs x last year.  Physically active.  Past medical history,surgical history, family history and social history were all reviewed and documented in the EPIC chart.  Gynecologic History Patient's last menstrual period was 07/01/2013. Contraception: post menopausal status Last Pap: 2017. Results were: normal Last mammogram: 11/2016. Results were: normal  Obstetric History OB History  Gravida Para Term Preterm AB Living  3 3       3   SAB TAB Ectopic Multiple Live Births               # Outcome Date GA Lbr Len/2nd Weight Sex Delivery Anes PTL Lv  3 Para           2 Para           1 Para                ROS: A ROS was performed and pertinent positives and negatives are included in the history.  GENERAL: No fevers or chills. HEENT: No change in vision, no earache, sore throat or sinus congestion. NECK: No pain or stiffness. CARDIOVASCULAR: No chest pain or pressure. No palpitations. PULMONARY: No shortness of breath, cough or wheeze. GASTROINTESTINAL: No abdominal pain, nausea, vomiting or diarrhea, melena or bright red blood per rectum. GENITOURINARY: No urinary frequency, urgency, hesitancy or dysuria. MUSCULOSKELETAL: No joint or muscle pain, no back pain, no recent trauma. DERMATOLOGIC: No rash, no itching, no lesions. ENDOCRINE: No polyuria, polydipsia, no heat or cold intolerance. No recent change in weight. HEMATOLOGICAL: No anemia or easy bruising or bleeding. NEUROLOGIC: No headache, seizures, numbness, tingling or weakness. PSYCHIATRIC: No depression, no loss of interest in normal activity or  change in sleep pattern.     Exam:   BP 124/86   Ht 5' 6.5" (1.689 m)   Wt 160 lb (72.6 kg)   LMP 07/01/2013   BMI 25.44 kg/m   Body mass index is 25.44 kg/m.  General appearance : Well developed well nourished female. No acute distress HEENT: Eyes: no retinal hemorrhage or exudates,  Neck supple, trachea midline, no carotid bruits, no thyroidmegaly Lungs: Clear to auscultation, no rhonchi or wheezes, or rib retractions  Heart: Regular rate and rhythm, no murmurs or gallops Breast:Examined in sitting and supine position were symmetrical in appearance, no palpable masses or tenderness,  no skin retraction, no nipple inversion, no nipple discharge, no skin discoloration, no axillary or supraclavicular lymphadenopathy Abdomen: no palpable masses or tenderness, no rebound or guarding Extremities: no edema or skin discoloration or tenderness  Pelvic: Vulva normal  Bartholin, Urethra, Skene Glands: Within normal limits             Vagina: No gross lesions or discharge  Cervix: No gross lesions or discharge.  Cervix narrow.  Pap/HPV HR done.  Uterus  AV, normal size, shape and consistency, non-tender and mobile  Adnexa  Without masses or tenderness  Anus and perineum  normal     Assessment/Plan:  56 y.o. female for annual exam   1. Encounter for routine gynecological examination with Papanicolaou smear of cervix  Normal Gyn exam.  Pap/HPV HR done.  If no TZ cells re narrow External Os, will repeat next year with dilation.  Breasts wnl.  Mammo 3D normal 11/2016.  2. Menopause present No HRT, no PMB.  Well tolerated.  On Vit D/Ca++ supplement/Weight bearing physical activity.  Schedule Bone Density. - DG Bone Density; Future  Princess Bruins MD, 8:47 AM 12/22/2016

## 2016-12-22 NOTE — Addendum Note (Signed)
Addended by: Thurnell Garbe A on: 12/22/2016 09:17 AM   Modules accepted: Orders

## 2016-12-26 ENCOUNTER — Other Ambulatory Visit: Payer: Self-pay | Admitting: Gynecology

## 2016-12-26 DIAGNOSIS — Z780 Asymptomatic menopausal state: Secondary | ICD-10-CM

## 2016-12-26 DIAGNOSIS — Z13820 Encounter for screening for osteoporosis: Secondary | ICD-10-CM

## 2016-12-26 LAB — PAP, TP IMAGING W/ HPV RNA, RFLX HPV TYPE 16,18/45: HPV DNA HIGH RISK: NOT DETECTED

## 2016-12-27 ENCOUNTER — Encounter: Payer: Self-pay | Admitting: *Deleted

## 2017-01-03 NOTE — Progress Notes (Signed)
Subjective:    Patient ID: Jean Pittman, female    DOB: June 03, 1960, 56 y.o.   MRN: [id number]  HPI She is here for an acute visit.   Cough:  She has dry persistent cough that is worse at night.  She believes the cough started sometime in August or September.  She has had a chronic cough that is been similar to this in the past.  I saw her for this in January of this past year.  No cause was found and the cough just resolved on its own.  She has seen ENT in the exam was normal.  She has had allergy testing in the past and is currently taking Zyrtec daily.  We tried Prilosec and that did not help.  She currently denies any heartburn.  The cough is dry.  She does have some postnasal drip.  She is taking Zyrtec daily.  The cough seems to be worse at night.  She denies any other cold symptoms, allergy symptoms or breathing problems.  If she uses a cough drop that seems to help the cough.    Medications and allergies reviewed with patient and updated if appropriate.  Patient Active Problem List   Diagnosis Date Noted  . SOB (shortness of breath) 03/22/2016  . Cough 03/22/2016  . Tick bite 10/12/2015  . Abnormal bruising 10/12/2015  . Sciatica 10/12/2015  . EKG abnormalities 02/15/2012  . ADENOMATOUS COLONIC POLYP 04/16/2010  . REACTIVE AIRWAY DISEASE 04/16/2010  . Hyperlipidemia 03/12/2009    Current Outpatient Medications on File Prior to Visit  Medication Sig Dispense Refill  . Black Cohosh 40 MG CAPS Take 40 mg by mouth daily.    Marland Kitchen CALCIUM-VITAMIN D PO Take by mouth daily.    . cetirizine (ZYRTEC) 10 MG tablet Take 1 tablet (10 mg total) by mouth daily. 30 tablet 11  . Magnesium 500 MG TABS Takes daily (Patient taking differently: Take 500 mg by mouth daily. Takes daily)    . Multiple Vitamin (MULTIVITAMIN) tablet Take 1 tablet by mouth daily. Probiotic with MVI     No current facility-administered medications on file prior to visit.     Past Medical History:  Diagnosis Date   . Dizziness   . Hyperlipidemia   . Tubular adenoma of colon 03/30/09   Dr Sharlett Iles    Past Surgical History:  Procedure Laterality Date  . APPENDECTOMY     with 1st pregnancy  . BREAST BIOPSY     needle aspiration X1; resection X 1  . colonoscopy with polypectomy  03/30/2009   Dr Sharlett Iles  . G 3 P 3     3 C sections  . TUBAL LIGATION      Social History   Socioeconomic History  . Marital status: Married    Spouse name: None  . Number of children: None  . Years of education: None  . Highest education level: None  Social Needs  . Financial resource strain: None  . Food insecurity - worry: None  . Food insecurity - inability: None  . Transportation needs - medical: None  . Transportation needs - non-medical: None  Occupational History  . None  Tobacco Use  . Smoking status: Never Smoker  . Smokeless tobacco: Never Used  Substance and Sexual Activity  . Alcohol use: No    Comment: rare  . Drug use: No  . Sexual activity: Yes    Partners: Male    Comment: 1st intercourse- 70, partners- 2, married- 77 yrs  Other Topics Concern  . None  Social History Narrative   Exercises regularly    Family History  Problem Relation Age of Onset  . Breast cancer Maternal Aunt   . Diabetes Maternal Aunt   . Stroke Maternal Aunt        ? . one M aunt;> 65  . Heart disease Neg Hx   . Colon cancer Neg Hx     Review of Systems  Constitutional: Negative for chills and fever.  HENT: Positive for postnasal drip. Negative for congestion and sore throat.   Respiratory: Positive for cough (dry). Negative for shortness of breath and wheezing.        Objective:   Vitals:   01/04/17 1106  BP: 114/62  Pulse: 62  Resp: 16  Temp: 98.2 F (36.8 C)  SpO2: 99%   Filed Weights   01/04/17 1106  Weight: 162 lb (73.5 kg)   Body mass index is 25.76 kg/m.  Wt Readings from Last 3 Encounters:  01/04/17 162 lb (73.5 kg)  12/22/16 160 lb (72.6 kg)  03/22/16 167 lb (75.8 kg)       Physical Exam GENERAL APPEARANCE: Appears stated age, well appearing, NAD EYES: conjunctiva clear, no icterus HEENT: bilateral tympanic membranes and ear canals normal, oropharynx with no erythema, no thyromegaly, trachea midline, no cervical or supraclavicular lymphadenopathy LUNGS: Clear to auscultation without wheeze or crackles, unlabored breathing, good air entry bilaterally HEART: Normal S1,S2 without murmurs EXTREMITIES: Without clubbing, cyanosis, or edema        Assessment & Plan:   See Problem List for Assessment and Plan of chronic medical problems.

## 2017-01-04 ENCOUNTER — Ambulatory Visit: Payer: BLUE CROSS/BLUE SHIELD | Admitting: Internal Medicine

## 2017-01-04 ENCOUNTER — Encounter: Payer: Self-pay | Admitting: Internal Medicine

## 2017-01-04 VITALS — BP 114/62 | HR 62 | Temp 98.2°F | Resp 16 | Wt 162.0 lb

## 2017-01-04 DIAGNOSIS — R059 Cough, unspecified: Secondary | ICD-10-CM

## 2017-01-04 DIAGNOSIS — R05 Cough: Secondary | ICD-10-CM

## 2017-01-04 NOTE — Assessment & Plan Note (Signed)
Chronic, dry cough Present for 2-3 months.  Has had this previously No evidence of active infection FENO was 7 in the past No improvement with Prilosec Has seen ENT and exam was normal Taking Zyrtec, occasionally takes nasal spray.  Advised Jean Pittman to start taking a nasal spray nightly to see if that helps We will refer to pulmonary for further evaluation/treatment

## 2017-01-04 NOTE — Patient Instructions (Signed)
Start using the nasal spray at night. Continue the zyrtec   A referral was ordered for a lung doctor.

## 2017-01-05 ENCOUNTER — Encounter: Payer: Self-pay | Admitting: Gynecology

## 2017-01-05 ENCOUNTER — Ambulatory Visit: Payer: BLUE CROSS/BLUE SHIELD

## 2017-01-05 DIAGNOSIS — Z13820 Encounter for screening for osteoporosis: Secondary | ICD-10-CM

## 2017-01-05 DIAGNOSIS — Z780 Asymptomatic menopausal state: Secondary | ICD-10-CM

## 2017-01-10 ENCOUNTER — Other Ambulatory Visit (INDEPENDENT_AMBULATORY_CARE_PROVIDER_SITE_OTHER): Payer: BLUE CROSS/BLUE SHIELD

## 2017-01-10 ENCOUNTER — Ambulatory Visit: Payer: BLUE CROSS/BLUE SHIELD | Admitting: Internal Medicine

## 2017-01-10 ENCOUNTER — Encounter: Payer: Self-pay | Admitting: Internal Medicine

## 2017-01-10 ENCOUNTER — Telehealth: Payer: Self-pay | Admitting: Internal Medicine

## 2017-01-10 VITALS — BP 122/80 | HR 115 | Ht 67.0 in | Wt 161.8 lb

## 2017-01-10 DIAGNOSIS — R058 Other specified cough: Secondary | ICD-10-CM

## 2017-01-10 DIAGNOSIS — R05 Cough: Secondary | ICD-10-CM

## 2017-01-10 LAB — CBC WITH DIFFERENTIAL/PLATELET
BASOS PCT: 1.3 % (ref 0.0–3.0)
Basophils Absolute: 0.1 10*3/uL (ref 0.0–0.1)
EOS PCT: 3.2 % (ref 0.0–5.0)
Eosinophils Absolute: 0.2 10*3/uL (ref 0.0–0.7)
HCT: 42.4 % (ref 36.0–46.0)
HEMOGLOBIN: 14.2 g/dL (ref 12.0–15.0)
LYMPHS ABS: 1.5 10*3/uL (ref 0.7–4.0)
Lymphocytes Relative: 31.4 % (ref 12.0–46.0)
MCHC: 33.4 g/dL (ref 30.0–36.0)
MCV: 91 fl (ref 78.0–100.0)
MONO ABS: 0.4 10*3/uL (ref 0.1–1.0)
Monocytes Relative: 8.4 % (ref 3.0–12.0)
NEUTROS PCT: 55.7 % (ref 43.0–77.0)
Neutro Abs: 2.7 10*3/uL (ref 1.4–7.7)
Platelets: 232 10*3/uL (ref 150.0–400.0)
RBC: 4.66 Mil/uL (ref 3.87–5.11)
RDW: 13.6 % (ref 11.5–15.5)
WBC: 4.8 10*3/uL (ref 4.0–10.5)

## 2017-01-10 MED ORDER — FAMOTIDINE 20 MG PO TABS: ORAL_TABLET | ORAL | 2 refills | Status: DC

## 2017-01-10 MED ORDER — PANTOPRAZOLE SODIUM 40 MG PO TBEC: 40.0000 mg | DELAYED_RELEASE_TABLET | Freq: Every day | ORAL | 2 refills | Status: DC

## 2017-01-10 MED ORDER — PREDNISONE 10 MG PO TABS: ORAL_TABLET | ORAL | 0 refills | Status: DC

## 2017-01-10 MED ORDER — TRAMADOL HCL 50 MG PO TABS: ORAL_TABLET | ORAL | 0 refills | Status: DC

## 2017-01-10 NOTE — Telephone Encounter (Signed)
MW do you want pt to continue on Zyrtec and Nasacort? The last note does not specify. Please advise.

## 2017-01-10 NOTE — Telephone Encounter (Signed)
Certainly once we have the problem addressed she can take them but for now they are not needed

## 2017-01-10 NOTE — Assessment & Plan Note (Addendum)
Upper airway cough syndrome (previously labeled PNDS),  is so named because it's frequently impossible to sort out how much is  CR/sinusitis with freq throat clearing (which can be related to primary GERD)   vs  causing  secondary (" extra esophageal")  GERD from wide swings in gastric pressure that occur with throat clearing, often  promoting self use of mint and menthol lozenges that reduce the lower esophageal sphincter tone and exacerbate the problem further in a cyclical fashion.   These are the same pts (now being labeled as having "irritable larynx syndrome" by some cough centers) who not infrequently have a history of having failed to tolerate ace inhibitors,  dry powder inhalers or biphosphonates or report having atypical/extraesophageal reflux symptoms that don't respond to standard doses of PPI  and are easily confused as having aecopd or asthma flares by even experienced allergists/ pulmonologists (myself included).   Of the three most common causes of  Sub-acute or recurrent or chronic cough, only one (GERD)  can actually contribute to/ trigger  the other two (asthma and post nasal drip syndrome)  and perpetuate the cylce of cough.  While not intuitively obvious, many patients with chronic low grade reflux do not cough until there is a primary insult that disturbs the protective epithelial barrier and exposes sensitive nerve endings.   This is typically viral but can be direct physical injury such as with an endotracheal tube.   The point is that once this occurs, it is difficult to eliminate the cycle  using anything but a maximally effective acid suppression regimen at least in the short run, accompanied by an appropriate diet to address non acid GERD and control / eliminate the cough itself for at least 3 days.    rec max gerd rx/ tramadol for cough/ for pnds 1st gen H1 blockers per guidelines      F/u with all meds in hand if not all better in 2 weeks    Total time devoted to  counseling  > 50 % of initial 60 min office visit:  review case with pt/ discussion of options/alternatives/ personally creating written customized instructions  in presence of pt  then going over those specific  Instructions directly with the pt including how to use all of the meds but in particular covering each new medication in detail and the difference between the maintenance= "automatic" meds and the prns using an action plan format for the latter (If this problem/symptom => do that organization reading Left to right).  Please see AVS from this visit for a full list of these instructions which I personally wrote for this pt and  are unique to this visit.

## 2017-01-10 NOTE — Progress Notes (Signed)
Subjective:     Patient ID: Jean Pittman, female   DOB: 1960/10/12,    MRN: [id number]  HPI  16 yowf never smoker with tendency to throat infections as child and problems with ? Sinus problems ?  Since late teens but never saw specialist and this improved over time but then tendency to "bronchtis" = prod cough with discolored mucus 3-4 x per year starting around 2012 eval by Linna Darner and rx with inhaler and resolves p abx /shot of pred and all better x 1-3 months and seems less since starting zyrtec helps if takes it from Sept - March then Oct 2017 developed  dry cough and sensation could't get a deep breath which was different so [facility] doctor rec CT chest   02/17/16 neg >  ENT eval neg and cough gradually resolved on nothing but Zyrtec and stopped it  in March 2018, restarted in Sept 2018 with onset the non-prod  cough in Oct 2018 so referred to pulmonary clinic 01/10/2017 by Dr   Quay Burow to sort out cause.   01/10/2017 1st [facility] office visit/ Jean Pittman   Chief Complaint  Patient presents with  . Pulmonary Consult    Referred by Dr. Billey Gosling. Pt c/o cough x 2-3 months. She states "I am very prone to bronchitis".  Cough is occ prod with clear sputum. She states her cough seems worse at night "feel things moving in my chest".    acute onset cough in early Oct 2018 worse at hs  on either side when lie down/ feels like lots of mucus building up in throat  but nothing produced with vigorous coughing. Not sob unless coughing/ has not tried anything but zyrtec but note she was already on this for her seasonal nasal symptoms prior to onset of cough   W/u by Dr Quay Burow has included FENO 7,  ent eval neg/ allergy testing neg   Best rx so far = cough drops    No obvious other patterns in  day to day or daytime variability or assoc excess/ purulent sputum or mucus plugs or hemoptysis or cp or chest tightness, subjective wheeze or overt sinus or hb symptoms. No unusual exp hx or h/o childhood pna/  asthma or knowledge of premature birth.  Also denies any obvious fluctuation of symptoms with weather or environmental changes or other aggravating or alleviating factors except as outlined above   Current Allergies, Complete Past Medical History, Past Surgical History, Family History, and Social History were reviewed in [employer] record.  ROS  The following are not active complaints unless bolded Hoarseness, sore throat, dysphagia, dental problems, itching, sneezing,  nasal congestion or discharge of excess mucus or purulent secretions, ear ache,   fever, chills, sweats, unintended wt loss or wt gain, classically pleuritic or exertional cp,  orthopnea pnd or leg swelling, presyncope, palpitations, abdominal pain, anorexia, nausea, vomiting, diarrhea  or change in bowel habits or change in bladder habits, change in stools or change in urine, dysuria, hematuria,  rash, arthralgias, visual complaints, headache, numbness, weakness or ataxia or problems with walking or coordination,  change in mood/affect or memory.        Current Meds  Medication Sig  . Black Cohosh 40 MG CAPS Take 40 mg by mouth daily.  Marland Kitchen CALCIUM-VITAMIN D PO Take by mouth daily.  . cetirizine (ZYRTEC) 10 MG tablet Take 1 tablet (10 mg total) by mouth daily.  . Magnesium 500 MG TABS Takes daily (Patient taking differently: Take  500 mg by mouth daily. Takes daily)  . MELATONIN PO Take 1 tablet at bedtime as needed by mouth.  . Multiple Vitamin (MULTIVITAMIN) tablet Take 1 tablet by mouth daily. Probiotic with MVI  . naproxen sodium (ALEVE) 220 MG tablet Take 220 mg daily as needed by mouth.  . triamcinolone (NASACORT) 55 MCG/ACT AERO nasal inhaler Place 1 spray daily into the nose.              Review of Systems     Objective:   Physical Exam   amb wf nad  Wt Readings from Last 3 Encounters:  01/10/17 161 lb 12.8 oz (73.4 kg)  01/04/17 162 lb (73.5 kg)  12/22/16 160 lb (72.6 kg)     Vital signs reviewed  - Note on arrival 02 sats  100% on RA     HEENT: nl dentition, turbinates bilaterally, and oropharynx. Nl external ear canals without cough reflex   NECK :  without JVD/Nodes/TM/ nl carotid upstrokes bilaterally   LUNGS: no acc muscle use,  Nl contour chest which is clear to A and P bilaterally with cough on insp     CV:  RRR  no s3 or murmur or increase in P2, and no edema   ABD:  soft and nontender with nl inspiratory excursion in the supine position. No bruits or organomegaly appreciated, bowel sounds nl  MS:  Nl gait/ ext warm without deformities, calf tenderness, cyanosis or clubbing No obvious joint restrictions   SKIN: warm and dry without lesions    NEURO:  alert, approp, nl sensorium with  no motor or cerebellar deficits apparent.       Labs ordered 01/10/2017    Allergy profile       Assessment:

## 2017-01-10 NOTE — Telephone Encounter (Signed)
Called pt and advised message from the provider. Pt understood and verbalized understanding. Nothing further is needed.    

## 2017-01-10 NOTE — Patient Instructions (Signed)
The key to effective treatment for your cough is eliminating the non-stop cycle of cough you're stuck in long enough to let your airway heal completely and then see if there is anything still making you cough once you stop the cough suppression, but this should take no more than 5 days to figure out  First take delsym two tsp every 12 hours and supplement if needed with  tramadol 50 mg up to 2 every 4 hours to suppress the urge to cough at all or even clear your throat. Swallowing water or using ice chips/non mint and menthol containing candies (such as lifesavers or sugarless jolly ranchers) are also effective.  You should rest your voice and avoid activities that you know make you cough.  Once you have eliminated the cough for 3 straight days try reducing the tramadol first,  then the delsym as tolerated.    Prednisone 10 mg take  4 each am x 2 days,   2 each am x 2 days,  1 each am x 2 days and stop (this is to eliminate allergies and inflammation from coughing)  Protonix (pantoprazole) Take 30-60 min before first meal of the day and Pepcid 20 mg one bedtime plus chlorpheniramine 4 mg x 2 at bedtime (both available over the counter)  until cough is completely gone for at least a week without the need for cough suppression  GERD (REFLUX)  is an extremely common cause of respiratory symptoms, many times with no significant heartburn at all.    It can be treated with medication, but also with lifestyle changes including avoidance of late meals, excessive alcohol, smoking cessation, and avoid fatty foods, chocolate, peppermint, colas, red wine, and acidic juices such as orange juice.  NO MINT OR MENTHOL PRODUCTS SO NO COUGH DROPS   USE HARD CANDY INSTEAD (jolley ranchers or Stover's or Lifesavers (all available in sugarless versions) NO OIL BASED VITAMINS - use powdered substitutes.   Please remember to go to the lab department downstairs in the basement  for your tests - we will call you with the  results when they are available.       If not 100% better in 2 weeks

## 2017-01-11 LAB — RESPIRATORY ALLERGY PROFILE REGION II ~~LOC~~
Allergen, Comm Silver Birch, t9: <0.1 kU/L
Allergen, Cottonwood, t14: <0.1 kU/L
Allergen, D pternoyssinus,d7: <0.1 kU/L
Bermuda Grass: <0.1 kU/L
CLADOSPORIUM HERBARUM (M2) IGE: <0.1 kU/L
CLASS: 0
CLASS: 0
CLASS: 0
CLASS: 0
CLASS: 0
CLASS: 0
CLASS: 0
CLASS: 0
CLASS: 0
CLASS: 0
COMMON RAGWEED (SHORT) (W1) IGE: <0.1 kU/L
Cat Dander: <0.1 kU/L
Class: 0
Class: 0
Class: 0
Class: 0
Class: 0
Class: 0
Class: 0
Class: 0
Class: 0
Class: 0
Class: 0
Class: 0
Class: 0
Class: 0
Dog Dander: <0.1 kU/L
IgE (Immunoglobulin E), Serum: 32 kU/L (ref <=114)
Johnson Grass: <0.1 kU/L
Pecan/Hickory Tree IgE: <0.1 kU/L
Sheep Sorrel IgE: <0.1 kU/L

## 2017-01-11 LAB — INTERPRETATION:

## 2017-01-12 ENCOUNTER — Encounter: Payer: Self-pay | Admitting: Internal Medicine

## 2017-01-12 NOTE — Progress Notes (Signed)
Spoke with pt and notified of results per Dr. Wert. Pt verbalized understanding and denied any questions. 

## 2017-01-12 NOTE — Telephone Encounter (Signed)
Jane Canary  to Tanda Rockers, MD     01/12/17 12:49 PM  Well you were right, but it really was after first day, before Prednisone, cough stopped.  Question, what medicines should I stop taking and when?  I know Prednisone you don't just stop so I figured I would continue that.  And I figured delsym and tramadol I could quit.  What about protonix and pepcid.  And should I just continue with chlorpheniramine instead of zyrtek. Should I continue 2 at night till spring?   I'm so happy and thankful for your knowledge. Just getting rid of that nagging cough is wonderful.  Thank you  Happy Thanksgiving to you and your family.    MW please advise. Thanks.

## 2017-01-18 ENCOUNTER — Encounter: Payer: Self-pay | Admitting: Obstetrics & Gynecology

## 2017-01-18 NOTE — Telephone Encounter (Signed)
She is pretty much right in the middle of normal range.

## 2017-01-30 ENCOUNTER — Encounter: Payer: Self-pay | Admitting: Internal Medicine

## 2017-02-03 ENCOUNTER — Encounter: Payer: Self-pay | Admitting: Internal Medicine

## 2017-02-03 ENCOUNTER — Ambulatory Visit: Payer: BLUE CROSS/BLUE SHIELD | Admitting: Internal Medicine

## 2017-02-03 VITALS — BP 122/64 | HR 78 | Ht 67.0 in | Wt 162.2 lb

## 2017-02-03 DIAGNOSIS — R05 Cough: Secondary | ICD-10-CM | POA: Diagnosis not present

## 2017-02-03 DIAGNOSIS — R058 Other specified cough: Secondary | ICD-10-CM

## 2017-02-03 NOTE — Progress Notes (Signed)
Subjective:     Patient ID: Jean Pittman, female   DOB: November 25, 1960,    MRN: [id number]    Brief patient profile:  46 yowf never smoker with tendency to throat infections as child and problems with ? Sinus problems ?  Since late teens but never saw specialist and this improved over time but then tendency to "bronchtis" = prod cough with discolored mucus 3-4 x per year starting around 2012 eval by Linna Darner and rx with inhaler and resolves p abx /"shot of prednisone" and all better x 1-3 months and seems to have it  less since starting zyrtec helps if takes it from Sept - March then Oct 2017 developed  dry cough and sensation could't get a deep breath which was different so [facility] doctor rec CT chest   02/17/16 neg >  ENT eval neg and cough gradually resolved on nothing but Zyrtec and stopped it  in March 2018, restarted in Sept 2018 with onset the non-prod  cough in Oct 2018 so referred to pulmonary clinic 01/10/2017 by Dr   Quay Burow to sort out cause.   01/10/2017 1st [facility] office visit/ Wert   Chief Complaint  Patient presents with  . Pulmonary Consult    Referred by Dr. Billey Gosling. Pt c/o cough x 2-3 months. She states "I am very prone to bronchitis".  Cough is occ prod with clear sputum. She states her cough seems worse at night "feel things moving in my chest".    acute onset cough in early Oct 2018 worse at hs  on either side when lie down/ feels like lots of mucus building up in throat  but nothing produced with vigorous coughing. Not sob unless coughing/ has not tried anything but zyrtec but note she was already on this for her seasonal nasal symptoms prior to onset of cough  w/u by Dr Quay Burow has included FENO 7,  ent eval neg/ allergy testing neg  Best rx so far = cough drops  rec  First take delsym two tsp every 12 hours and supplement if needed with  tramadol 50 mg up to 2 every 4 hours  Once you have eliminated the cough for 3 straight days try reducing the tramadol first,  then  the delsym as tolerated.   Prednisone 10 mg take  4 each am x 2 days,   2 each am x 2 days,  1 each am x 2 days and stop (this is to eliminate allergies and inflammation from coughing) Protonix (pantoprazole) Take 30-60 min before first meal of the day and Pepcid 20 mg one bedtime plus chlorpheniramine 4 mg x 2 at bedtime (both available over the counter)  until cough is completely gone for at least a week without the need for cough suppression GERD diet             02/03/2017  f/u ov/Wert re:  Cough since early oct 2018  Chief Complaint  Patient presents with  . Follow-up    cough still there at random times, is reduced some since last visit   cough still immediately on lying down with sensation of pnds s excess mucus and  does not keep her up and much less freq daytime   Not limited by breathing from desired activities    No obvious day to day or daytime variability or assoc excess/ purulent sputum or mucus plugs or hemoptysis or cp or chest tightness, subjective wheeze or overt sinus or hb symptoms. No unusual exposure hx or  h/o childhood pna/ asthma or knowledge of premature birth.  Sleeping ok flat without nocturnal  or early am exacerbation  of respiratory  c/o's or need for noct saba. Also denies any obvious fluctuation of symptoms with weather or environmental changes or other aggravating or alleviating factors except as outlined above   Current Allergies, Complete Past Medical History, Past Surgical History, Family History, and Social History were reviewed in [employer] record.  ROS  The following are not active complaints unless bolded Hoarseness, sore throat, dysphagia, dental problems, itching, sneezing,  nasal congestion or discharge of excess mucus or purulent secretions, ear ache,   fever, chills, sweats, unintended wt loss or wt gain, classically pleuritic or exertional cp,  orthopnea pnd or leg swelling, presyncope, palpitations, abdominal pain,  anorexia, nausea, vomiting, diarrhea  or change in bowel habits or change in bladder habits, change in stools or change in urine, dysuria, hematuria,  rash, arthralgias, visual complaints, headache, numbness, weakness or ataxia or problems with walking or coordination,  change in mood/affect or memory.        Current Meds  Medication Sig  . benzonatate (TESSALON) 200 MG capsule Take 200 mg by mouth 3 (three) times daily as needed for cough.  . Black Cohosh 40 MG CAPS Take 40 mg by mouth daily.  Marland Kitchen CALCIUM-VITAMIN D PO Take by mouth daily.  . cetirizine (ZYRTEC) 10 MG tablet Take 1 tablet (10 mg total) by mouth daily.  . chlorpheniramine (CHLOR-TRIMETON) 4 MG tablet Take 4 mg by mouth 2 (two) times daily as needed for allergies.  Marland Kitchen dextromethorphan (DELSYM) 30 MG/5ML liquid Take by mouth as needed for cough.  . famotidine (PEPCID) 20 MG tablet One at bedtime  . Magnesium 500 MG TABS Takes daily (Patient taking differently: Take 500 mg by mouth daily. Takes daily)  . MELATONIN PO Take 1 tablet at bedtime as needed by mouth.  . Multiple Vitamin (MULTIVITAMIN) tablet Take 1 tablet by mouth daily. Probiotic with MVI  . naproxen sodium (ALEVE) 220 MG tablet Take 220 mg daily as needed by mouth.  . pantoprazole (PROTONIX) 40 MG tablet Take 1 tablet (40 mg total) daily by mouth. Take 30-60 min before first meal of the day  . traMADol (ULTRAM) 50 MG tablet 1-2 every 4 hours as needed for cough or pain  . triamcinolone (NASACORT) 55 MCG/ACT AERO nasal inhaler Place 1 spray daily into the nose.               Objective:   Physical Exam   amb wf/ all smiles   02/03/2017       162   01/10/17 161 lb 12.8 oz (73.4 kg)  01/04/17 162 lb (73.5 kg)  12/22/16 160 lb (72.6 kg)    Vital signs reviewed - Note on arrival 02 sats  99% on RA     LUNGS: no acc muscle use,  Nl contour chest which is clear to A and P bilaterally with cough on insp    HEENT: nl dentition, turbinates bilaterally, and  oropharynx. Nl external ear canals without cough reflex   NECK :  without JVD/Nodes/TM/ nl carotid upstrokes bilaterally   LUNGS: no acc muscle use,  Nl contour chest which is clear to A and P bilaterally with cough early on insp/ no exp cough    CV:  RRR  no s3 or murmur or increase in P2, and no edema   ABD:  soft and nontender with nl inspiratory excursion in the  supine position. No bruits or organomegaly appreciated, bowel sounds nl  MS:  Nl gait/ ext warm without deformities, calf tenderness, cyanosis or clubbing No obvious joint restrictions   SKIN: warm and dry without lesions    NEURO:  alert, approp, nl sensorium with  no motor or cerebellar deficits apparent.              Assessment:

## 2017-02-03 NOTE — Patient Instructions (Signed)
Delsym 2 tsp every 12 hours as needed   If not effective, add benzoate to the delsym    Stay on acid suppression right thru the holidays then ok to try off to see if cough flares and if it does we need to refer you to GI next

## 2017-02-05 ENCOUNTER — Encounter: Payer: Self-pay | Admitting: Internal Medicine

## 2017-02-05 NOTE — Assessment & Plan Note (Addendum)
FENO 03/22/2016  =     7  Allergy profile 01/10/2017 >  Eos 0.2 /  IgE  32 RAST neg Cyclical cough regimen 45/62/5638  > improved 02/03/2017   Clearly better but still sense of pnds s evidence of excess mucus typical of irritable larynx syndrome and not asthma or any pulmonary dx  Since improved would continue gerd rx thru holidays and then try off > GI eval if flares  If not satisfied the next step is trial of gabapentin or refer to Dr Ella Bodo at [facility].   Each maintenance medication was reviewed in detail including most importantly the difference between maintenance and as needed and under what circumstances the prns are to be used.  Please see AVS for specific  Instructions which are unique to this visit and I personally typed out  which were reviewed in detail in writing with the patient and a copy provided.

## 2017-03-27 ENCOUNTER — Encounter: Payer: Self-pay | Admitting: Internal Medicine

## 2017-03-27 NOTE — Telephone Encounter (Signed)
MW please advise.  Thanks.  

## 2017-03-27 NOTE — Telephone Encounter (Signed)
If the cough flared off gerd rx then needs gi next  If it did not then the next step is trial of gabapentin 100 mg tid and f/u here or at [facility] Dr Carol Ada

## 2017-03-28 ENCOUNTER — Other Ambulatory Visit: Payer: Self-pay

## 2017-03-28 DIAGNOSIS — K219 Gastro-esophageal reflux disease without esophagitis: Secondary | ICD-10-CM

## 2017-03-28 NOTE — Progress Notes (Signed)
[facility] ref gastro  

## 2017-03-28 NOTE — Telephone Encounter (Signed)
Pt is now requesting Tessalon 200mg .  Pt states she has taken this before and it has helped but it was prescribed by another provider, never by [facility].  Pt states she cannot get in with her GI doc until 05/2017 and is requesting the refills.   Dr. Melvyn Novas please advise. Thanks.

## 2017-03-28 NOTE — Telephone Encounter (Signed)
No need for sooner f/u as we can fill the meds she needs in the meantime but would go ahead with [facility] and refill her tessalon x 3 refills same rx

## 2017-03-29 MED ORDER — BENZONATATE 200 MG PO CAPS: 200.0000 mg | ORAL_CAPSULE | Freq: Three times a day (TID) | ORAL | 3 refills | Status: DC | PRN

## 2017-03-30 ENCOUNTER — Other Ambulatory Visit: Payer: Self-pay | Admitting: Internal Medicine

## 2017-04-30 ENCOUNTER — Other Ambulatory Visit: Payer: Self-pay | Admitting: Internal Medicine

## 2017-05-04 ENCOUNTER — Other Ambulatory Visit: Payer: Self-pay | Admitting: Internal Medicine

## 2017-05-24 ENCOUNTER — Encounter: Payer: Self-pay | Admitting: Internal Medicine

## 2017-05-25 DIAGNOSIS — J0100 Acute maxillary sinusitis, unspecified: Secondary | ICD-10-CM | POA: Diagnosis not present

## 2017-05-25 DIAGNOSIS — J209 Acute bronchitis, unspecified: Secondary | ICD-10-CM | POA: Diagnosis not present

## 2017-05-30 ENCOUNTER — Ambulatory Visit: Payer: BLUE CROSS/BLUE SHIELD | Admitting: Gastroenterology

## 2017-06-28 DIAGNOSIS — H10413 Chronic giant papillary conjunctivitis, bilateral: Secondary | ICD-10-CM | POA: Diagnosis not present

## 2017-08-02 ENCOUNTER — Ambulatory Visit: Payer: Self-pay

## 2017-08-02 NOTE — Progress Notes (Signed)
Subjective:    Patient ID: Jean Pittman, female    DOB: [birth year], 57 y.o.   MRN: [id number]  HPI The patient is here for an acute visit for SOB and fatigue  SOB:  Started 3-4 weeks ago.  She denies DOE but feels need to take a deep breath at times to get more air.  She does this both at rest and with activity.  She feels like she can not get a deep breath.  Her chest hurts with deep breaths, but she denies other chest pain.  She denies any wheezing.  She does have a cough.  She denies any fevers or chills.  Fatigue:  It started a few weeks ago she thinks, but three days ago she went to exercise she also felt completely exhausted.   She has been tired since then.   The decreased energy level/fatigue is very uncommon for her.   Cough: She did have somewhat of a chronic cough last year.  It went away after taking prilosec, pepcid and chlorpheniramine-the cough resolved after 1-2 months.  She was cough free for a while.  It returned after she got an URI and has persisted since then.  The cough now is different - it feels like it is in the throat.  She was unsure if she should go back to take the same medications.  The cough is not new with her current complaints.  Medications and allergies reviewed with patient and updated if appropriate.  Patient Active Problem List   Diagnosis Date Noted  . Upper airway cough syndrome 01/10/2017  . SOB (shortness of breath) 03/22/2016  . Cough 03/22/2016  . Tick bite 10/12/2015  . Abnormal bruising 10/12/2015  . Sciatica 10/12/2015  . EKG abnormalities 02/15/2012  . ADENOMATOUS COLONIC POLYP 04/16/2010  . REACTIVE AIRWAY DISEASE 04/16/2010  . Hyperlipidemia 03/12/2009    Current Outpatient Medications on File Prior to Visit  Medication Sig Dispense Refill  . benzonatate (TESSALON) 200 MG capsule Take 1 capsule (200 mg total) by mouth 3 (three) times daily as needed for cough. 90 capsule 3  . Black Cohosh 40 MG CAPS Take 40 mg by mouth daily.    Marland Kitchen  CALCIUM-VITAMIN D PO Take by mouth daily.    . chlorpheniramine (CHLOR-TRIMETON) 4 MG tablet Take 4 mg by mouth 2 (two) times daily as needed for allergies.    . Magnesium 500 MG TABS Takes daily (Patient taking differently: Take 500 mg by mouth daily. Takes daily)    . MELATONIN PO Take 1 tablet at bedtime as needed by mouth.    . naproxen sodium (ALEVE) 220 MG tablet Take 220 mg daily as needed by mouth.    . Multiple Vitamin (MULTIVITAMIN) tablet Take 1 tablet by mouth daily. Probiotic with MVI     No current facility-administered medications on file prior to visit.     Past Medical History:  Diagnosis Date  . Dizziness   . Hyperlipidemia   . Tubular adenoma of colon 03/30/09   Dr Sharlett Iles    Past Surgical History:  Procedure Laterality Date  . APPENDECTOMY     with 1st pregnancy  . BREAST BIOPSY     needle aspiration X1; resection X 1  . colonoscopy with polypectomy  03/30/2009   Dr Sharlett Iles  . G 3 P 3     3 C sections  . TUBAL LIGATION      Social History   Socioeconomic History  . Marital status: Married  Spouse name: Not on file  . Number of children: Not on file  . Years of education: Not on file  . Highest education level: Not on file  Occupational History  . Not on file  Social Needs  . Financial resource strain: Not on file  . Food insecurity:    Worry: Not on file    Inability: Not on file  . Transportation needs:    Medical: Not on file    Non-medical: Not on file  Tobacco Use  . Smoking status: Never Smoker  . Smokeless tobacco: Never Used  Substance and Sexual Activity  . Alcohol use: No    Comment: rare  . Drug use: No  . Sexual activity: Yes    Partners: Male    Comment: 1st intercourse- 73, partners- 2, married- 55 yrs   Lifestyle  . Physical activity:    Days per week: Not on file    Minutes per session: Not on file  . Stress: Not on file  Relationships  . Social connections:    Talks on phone: Not on file    Gets together: Not  on file    Attends religious service: Not on file    Active member of club or organization: Not on file    Attends meetings of clubs or organizations: Not on file    Relationship status: Not on file  Other Topics Concern  . Not on file  Social History Narrative   Exercises regularly    Family History  Problem Relation Age of Onset  . Breast cancer Maternal Aunt   . Diabetes Maternal Aunt   . Stroke Maternal Aunt        ? . one M aunt;> 65  . Heart disease Neg Hx   . Colon cancer Neg Hx     Review of Systems  Constitutional: Positive for fatigue. Negative for appetite change, chills and fever.  HENT: Negative for sore throat and trouble swallowing.   Respiratory: Positive for cough (dry since URI - in throat) and shortness of breath (needing to take deep breaths at rest and with exertion). Negative for wheezing.   Cardiovascular: Negative for chest pain, palpitations and leg swelling.  Gastrointestinal: Negative for abdominal pain and nausea.       No gerd  Neurological: Positive for light-headedness (last couple of days) and headaches (occ ).  Psychiatric/Behavioral: Negative for dysphoric mood and sleep disturbance. The patient is not nervous/anxious.        Objective:   Vitals:   08/03/17 1031  BP: 124/72  Pulse: (!) 59  Resp: 16  Temp: 97.7 F (36.5 C)  SpO2: 98%   BP Readings from Last 3 Encounters:  08/03/17 124/72  02/03/17 122/64  01/10/17 122/80   Wt Readings from Last 3 Encounters:  08/03/17 162 lb (73.5 kg)  02/03/17 162 lb 3.2 oz (73.6 kg)  01/10/17 161 lb 12.8 oz (73.4 kg)   Body mass index is 25.37 kg/m.   Physical Exam  Constitutional: She is oriented to person, place, and time. She appears well-developed and well-nourished. No distress.  HENT:  Head: Normocephalic and atraumatic.  Right Ear: External ear normal.  Left Ear: External ear normal.  Mouth/Throat: Oropharynx is clear and moist.  Normal ear canals and TM b/l  Eyes: Conjunctivae  are normal.  Neck: Normal range of motion. No tracheal deviation present. No thyromegaly present.  Cardiovascular: Normal rate, regular rhythm and normal heart sounds.  No murmur heard. Pulmonary/Chest: Effort normal and  breath sounds normal. No respiratory distress. She has no wheezes. She has no rales.  Abdominal: Soft. She exhibits no distension and no mass. There is no tenderness.  Musculoskeletal: She exhibits no edema.  Lymphadenopathy:    She has no cervical adenopathy.  Neurological: She is alert and oriented to person, place, and time.  Skin: Skin is warm and dry. She is not diaphoretic.  Psychiatric: She has a normal mood and affect. Her behavior is normal.           Assessment & Plan:    See Problem List for Assessment and Plan of chronic medical problems.

## 2017-08-02 NOTE — Telephone Encounter (Signed)
Pt. called to report feeling short of breath over past 3-4 weeks.  Stated she does not get short of breath with activity, but describes "need to intermittently take a deep breath to get her air."  Admitted to similar symptoms in past, that resolved without treatment.  Stated she has felt more tired the past couple of days. Denied chest pain, but stated "my chest muscles are tired from having to take a deep breath."  Reported dry cough, intermittently.  Reported being referred to Gastroenterologist for Endoscopy, due to coughing issues,, but cancelled that appt., when symptoms improved.  Stated she is very active; exercises daily, and works in her yard.  Stated she noticed how tired she is feeling, and thought she should be evaluated.  Appt. Given for 6/6 with PCP.  Pt. Advised to call back if symptoms worsen.  Verb. Understanding. Agrees with plan.   Reason for Disposition . [1] MILD difficulty breathing (e.g., minimal/no SOB at rest, SOB with walking, pulse <100) AND [2] NEW-onset or WORSE than normal  Answer Assessment - Initial Assessment Questions 1. RESPIRATORY STATUS: "Describe your breathing?" (e.g., wheezing, shortness of breath, unable to speak, severe coughing)      Feels short of breath, and needs to take a deep breath to feel relief 2. ONSET: "When did this breathing problem begin?"      3 weeks to a month  3. PATTERN "Does the difficult breathing come and go, or has it been constant since it started?"      Intermittent 4. SEVERITY: "How bad is your breathing?" (e.g., mild, moderate, severe)    - MILD: No SOB at rest, mild SOB with walking, speaks normally in sentences, can lay down, no retractions, pulse < 100.    - MODERATE: SOB at rest, SOB with minimal exertion and prefers to sit, cannot lie down flat, speaks in phrases, mild retractions, audible wheezing, pulse 100-120.    - SEVERE: Very SOB at rest, speaks in single words, struggling to breathe, sitting hunched forward, retractions,  pulse > 120      Moderate 5. RECURRENT SYMPTOM: "Have you had difficulty breathing before?" If so, ask: "When was the last time?" and "What happened that time?"      Reported hx of this in past 6. CARDIAC HISTORY: "Do you have any history of heart disease?" (e.g., heart attack, angina, bypass surgery, angioplasty)      denied 7. LUNG HISTORY: "Do you have any history of lung disease?"  (e.g., pulmonary embolus, asthma, emphysema)     Hx of bronchitis 8. CAUSE: "What do you think is causing the breathing problem?"      unknown 9. OTHER SYMPTOMS: "Do you have any other symptoms? (e.g., dizziness, runny nose, cough, chest pain, fever)     Feels very tired over past couple days; reported her chest muscles are tired with deep breathing; denied chest pain; dry cough is present intermittently   10. PREGNANCY: "Is there any chance you are pregnant?" "When was your last menstrual period?"       n/a 11. TRAVEL: "Have you traveled out of the country in the last month?" (e.g., travel history, exposures)       No  Protocols used: BREATHING DIFFICULTY-A-AH

## 2017-08-03 ENCOUNTER — Ambulatory Visit (INDEPENDENT_AMBULATORY_CARE_PROVIDER_SITE_OTHER)
Admission: RE | Admit: 2017-08-03 | Discharge: 2017-08-03 | Disposition: A | Discharge status: Home / Self Care | Payer: BLUE CROSS/BLUE SHIELD | Source: Ambulatory Visit | Attending: Internal Medicine | Admitting: Internal Medicine

## 2017-08-03 ENCOUNTER — Other Ambulatory Visit (INDEPENDENT_AMBULATORY_CARE_PROVIDER_SITE_OTHER): Payer: BLUE CROSS/BLUE SHIELD

## 2017-08-03 ENCOUNTER — Encounter: Payer: Self-pay | Admitting: Internal Medicine

## 2017-08-03 ENCOUNTER — Ambulatory Visit: Payer: BLUE CROSS/BLUE SHIELD | Admitting: Internal Medicine

## 2017-08-03 VITALS — BP 124/72 | HR 59 | Temp 97.7°F | Resp 16 | Wt 162.0 lb

## 2017-08-03 DIAGNOSIS — R0602 Shortness of breath: Secondary | ICD-10-CM | POA: Diagnosis not present

## 2017-08-03 DIAGNOSIS — R059 Cough, unspecified: Secondary | ICD-10-CM

## 2017-08-03 DIAGNOSIS — R05 Cough: Secondary | ICD-10-CM

## 2017-08-03 DIAGNOSIS — R5383 Other fatigue: Secondary | ICD-10-CM

## 2017-08-03 LAB — CBC WITH DIFFERENTIAL/PLATELET
BASOS PCT: 1.3 % (ref 0.0–3.0)
Basophils Absolute: 0.1 10*3/uL (ref 0.0–0.1)
EOS ABS: 0.1 10*3/uL (ref 0.0–0.7)
Eosinophils Relative: 1.9 % (ref 0.0–5.0)
HEMATOCRIT: 41.5 % (ref 36.0–46.0)
Hemoglobin: 14.2 g/dL (ref 12.0–15.0)
LYMPHS PCT: 29.6 % (ref 12.0–46.0)
Lymphs Abs: 1.5 10*3/uL (ref 0.7–4.0)
MCHC: 34.2 g/dL (ref 30.0–36.0)
MCV: 90.3 fl (ref 78.0–100.0)
MONOS PCT: 7.9 % (ref 3.0–12.0)
Monocytes Absolute: 0.4 10*3/uL (ref 0.1–1.0)
NEUTROS ABS: 3 10*3/uL (ref 1.4–7.7)
Neutrophils Relative %: 59.3 % (ref 43.0–77.0)
PLATELETS: 259 10*3/uL (ref 150.0–400.0)
RBC: 4.6 Mil/uL (ref 3.87–5.11)
RDW: 14.6 % (ref 11.5–15.5)
WBC: 5 10*3/uL (ref 4.0–10.5)

## 2017-08-03 LAB — COMPREHENSIVE METABOLIC PANEL
ALT: 15 U/L (ref 0–35)
AST: 17 U/L (ref 0–37)
Albumin: 4.4 g/dL (ref 3.5–5.2)
Alkaline Phosphatase: 27 U/L — ABNORMAL LOW (ref 39–117)
BUN: 13 mg/dL (ref 6–23)
CALCIUM: 9.8 mg/dL (ref 8.4–10.5)
CHLORIDE: 102 meq/L (ref 96–112)
CO2: 29 meq/L (ref 19–32)
Creatinine, Ser: 0.94 mg/dL (ref 0.40–1.20)
GFR: 64.91 mL/min (ref >60.00)
Glucose, Bld: 102 mg/dL — ABNORMAL HIGH (ref 70–99)
POTASSIUM: 4 meq/L (ref 3.5–5.1)
Sodium: 139 mEq/L (ref 135–145)
Total Bilirubin: 0.6 mg/dL (ref 0.2–1.2)
Total Protein: 7.2 g/dL (ref 6.0–8.3)

## 2017-08-03 LAB — TSH: TSH: 2.62 u[IU]/mL (ref 0.35–4.50)

## 2017-08-03 NOTE — Assessment & Plan Note (Signed)
Chronic cough-last year it resolved with chlorpheniramine, Protonix and Pepcid-once the cough resolved she did discontinue medications and she was cough free until after she had any URI-she has had a dry cough since then Lungs are clear Getting a chest x-ray for her change in breathing She will start taking her Zyrtec daily and restart the Pepcid May need additional or stronger medication of those medications do not work-once controlled she can try tapering off 1 of these medications at a time to see if she truly needs it or not

## 2017-08-03 NOTE — Patient Instructions (Addendum)
  Test(s) ordered today. Your results will be released to [place] (or called to you) after review, usually within 72hours after test completion. If any changes need to be made, you will be notified at that same time.   Medications reviewed and updated.  Changes include restart the zyrtec nightly and pepcid twice daily.

## 2017-08-03 NOTE — Assessment & Plan Note (Signed)
Experiencing fatigue, worse in the last 3 days We will check blood work including CBC, CMP, TSH to rule out some causes Denies any lifestyle changes, sleep changes

## 2017-08-03 NOTE — Assessment & Plan Note (Signed)
Not to shortness of breath-more of the need to take a deep breath because she is not getting enough air in Occurring at rest and with activity Likely not pulmonary in nature, denies any anxiety Will treat chronic cough-she will restart the Pepcid and taking allergy medication daily We will treat possible silent GERD Chest x-ray today May need to consider pulmonary evaluation if no improvement

## 2017-08-04 ENCOUNTER — Other Ambulatory Visit: Payer: Self-pay | Admitting: Emergency Medicine

## 2017-08-04 MED ORDER — NYSTATIN 100000 UNIT/ML MT SUSP: 5.0000 mL | Freq: Four times a day (QID) | OROMUCOSAL | 0 refills | Status: DC

## 2017-08-04 NOTE — Telephone Encounter (Signed)
Nystatin mouth wash is the typical treatment - it is pending -- the nystatin is an anti-fungal.

## 2017-08-04 NOTE — Telephone Encounter (Addendum)
Please send in Magic mouth wash for pt due to thrush after anti biotic

## 2017-08-05 ENCOUNTER — Encounter: Payer: Self-pay | Admitting: Internal Medicine

## 2017-08-06 MED ORDER — FAMOTIDINE 20 MG PO TABS: 20.0000 mg | ORAL_TABLET | Freq: Two times a day (BID) | ORAL | 5 refills | Status: DC

## 2017-09-10 ENCOUNTER — Other Ambulatory Visit: Payer: Self-pay | Admitting: Internal Medicine

## 2017-09-11 ENCOUNTER — Encounter: Payer: Self-pay | Admitting: Internal Medicine

## 2017-09-16 ENCOUNTER — Other Ambulatory Visit: Payer: Self-pay | Admitting: Internal Medicine

## 2017-09-18 ENCOUNTER — Encounter: Payer: Self-pay | Admitting: Internal Medicine

## 2017-09-18 DIAGNOSIS — K219 Gastro-esophageal reflux disease without esophagitis: Secondary | ICD-10-CM

## 2017-09-18 NOTE — Telephone Encounter (Signed)
Pantoprazole not on med list. Please advise.

## 2017-09-20 MED ORDER — PANTOPRAZOLE SODIUM 40 MG PO TBEC: 40.0000 mg | DELAYED_RELEASE_TABLET | Freq: Two times a day (BID) | ORAL | 5 refills | Status: DC

## 2017-09-20 MED ORDER — CLOTRIMAZOLE 10 MG MT TROC: 10.0000 mg | Freq: Every day | OROMUCOSAL | 0 refills | Status: DC

## 2017-10-26 ENCOUNTER — Ambulatory Visit: Payer: BLUE CROSS/BLUE SHIELD | Admitting: Physician Assistant

## 2017-10-26 ENCOUNTER — Encounter: Payer: Self-pay | Admitting: Physician Assistant

## 2017-10-26 VITALS — BP 114/72 | HR 79 | Ht 67.0 in | Wt 164.5 lb

## 2017-10-26 DIAGNOSIS — B370 Candidal stomatitis: Secondary | ICD-10-CM | POA: Diagnosis not present

## 2017-10-26 DIAGNOSIS — R053 Chronic cough: Secondary | ICD-10-CM

## 2017-10-26 DIAGNOSIS — R05 Cough: Secondary | ICD-10-CM

## 2017-10-26 MED ORDER — NYSTATIN 100000 UNIT/ML MT SUSP: OROMUCOSAL | 0 refills | Status: DC

## 2017-10-26 NOTE — Patient Instructions (Addendum)
You have been scheduled for an endoscopy. Please follow written instructions given to you at your visit today. If you use inhalers (even only as needed), please bring them with you on the day of your procedure. Your physician has requested that you go to www.startemmi.com and enter the access code given to you at your visit today. This web site gives a general overview about your procedure. However, you should still follow specific instructions given to you by our office regarding your preparation for the procedure.  We have sent the following medications to your pharmacy for you to pick up at your convenience: Nystatin   Continue Pantopazole and Pepcid as prescribed.  Thank you for choosing me and [facility].   Ellouise Newer, PA-C

## 2017-10-26 NOTE — Progress Notes (Signed)
Chief Complaint: Chronic cough  HPI:    Jean Pittman is a 57 year old female with a past medical history as listed below, who follows with Dr. Ardis Hughs, and who was referred to me by Binnie Rail, MD for a complaint of chronic cough.      07/16/2014 colonoscopy for surveillance due to prior colonic neoplasia with finding of a sessile polyp in the cecum and otherwise normal exam.  Pathology showed benign colonic mucosa.  Repeat colonoscopy recommended in 10 years.    08/03/17 office visit with PCP discussed cough over the past year.  Treated for possible silent reflux.  She was treated with Chlorpheniramine, Protonix and Pepcid.    Today, patient explains that she has had a chronic cough for over a year now and has been seen by multiple different specialist.  Initially this was thought to be bronchitis and patient was given medication for this, but it continued after she finished the antibiotics.  Then had a CT which did not show any complications and continued with a cough.  Seen by pulmonology who started her on Pepcid and Pantoprazole for what they thought was silent reflux and this did help her symptoms slightly.  Tells me that "I do not like to take medicine", so apparently she went off of these medications and her cough got worse for a time.  Currently she is back on these medications and feels slightly better but is not completely well.  Does have episodes of coughing which are very "wet", and feels as though there are "vapors" in her throat.  This is often worse at night when she lays down to sleep.  Most recently patient stopped taking a "pea protein drink" that she was drinking in the mornings which did have some Xylitol in it and this has maybe helped to decrease her symptoms some too.  Patient is unsure how this is related.    Also has complaint of oral thrush which she has had since being on antibiotics a few months ago.  Apparently was prescribed several different things for this but never picked  these up at the pharmacy.    Denies fever, chills, weight loss, anorexia, reflux, heartburn, abdominal pain or change in bowel habits.  Past Medical History:  Diagnosis Date  . Dizziness   . Hyperlipidemia   . Tubular adenoma of colon 03/30/09   Dr Sharlett Iles    Past Surgical History:  Procedure Laterality Date  . APPENDECTOMY     with 1st pregnancy  . BREAST BIOPSY     needle aspiration X1; resection X 1  . colonoscopy with polypectomy  03/30/2009   Dr Sharlett Iles  . frozen shoulder Left    broke the cartilage-was put to sleep Dr. Joni Fears apox 2014  . G 3 P 3     3 C sections  . TUBAL LIGATION      Current Outpatient Medications  Medication Sig Dispense Refill  . benzonatate (TESSALON) 200 MG capsule Take 1 capsule (200 mg total) by mouth 3 (three) times daily as needed for cough. 90 capsule 3  . CALCIUM-VITAMIN D PO Take by mouth daily.    . famotidine (PEPCID) 20 MG tablet Take 1 tablet (20 mg total) by mouth 2 (two) times daily. 60 tablet 5  . Magnesium 500 MG TABS Takes daily (Patient taking differently: Take 500 mg by mouth daily. Takes daily)    . MELATONIN PO Take 1 tablet at bedtime as needed by mouth.    . naproxen  sodium (ALEVE) 220 MG tablet Take 220 mg daily as needed by mouth.    . pantoprazole (PROTONIX) 40 MG tablet Take 1 tablet (40 mg total) by mouth 2 (two) times daily before a meal. 60 tablet 5   No current facility-administered medications for this visit.     Allergies as of 10/26/2017  . (No Known Allergies)    Family History  Problem Relation Age of Onset  . Breast cancer Maternal Aunt   . Diabetes Maternal Aunt   . Stroke Maternal Aunt        ? . one M aunt;> 65  . Heart disease Neg Hx   . Colon cancer Neg Hx     Social History   Socioeconomic History  . Marital status: Married    Spouse name: Not on file  . Number of children: Not on file  . Years of education: Not on file  . Highest education level: Not on file  Occupational  History  . Not on file  Social Needs  . Financial resource strain: Not on file  . Food insecurity:    Worry: Not on file    Inability: Not on file  . Transportation needs:    Medical: Not on file    Non-medical: Not on file  Tobacco Use  . Smoking status: Never Smoker  . Smokeless tobacco: Never Used  Substance and Sexual Activity  . Alcohol use: No    Comment: rare  . Drug use: No  . Sexual activity: Yes    Partners: Male    Comment: 1st intercourse- 65, partners- 2, married- 57 yrs   Lifestyle  . Physical activity:    Days per week: Not on file    Minutes per session: Not on file  . Stress: Not on file  Relationships  . Social connections:    Talks on phone: Not on file    Gets together: Not on file    Attends religious service: Not on file    Active member of club or organization: Not on file    Attends meetings of clubs or organizations: Not on file    Relationship status: Not on file  . Intimate partner violence:    Fear of current or ex partner: Not on file    Emotionally abused: Not on file    Physically abused: Not on file    Forced sexual activity: Not on file  Other Topics Concern  . Not on file  Social History Narrative   Exercises regularly    Review of Systems:    Constitutional: No weight loss, fever or chills Skin: No rash  Cardiovascular: No chest pain Respiratory: No SOB  Gastrointestinal: See HPI and otherwise negative Genitourinary: No dysuria  Neurological: No headache, dizziness or syncope Musculoskeletal: No new muscle or joint pain Hematologic: No bleeding Psychiatric: No history of depression or anxiety  Physical Exam:  Vital signs: BP 114/72   Pulse 79   Ht 5\' 7"  (1.702 m)   Wt 164 lb 8 oz (74.6 kg)   LMP 07/01/2013   BMI 25.76 kg/m   Constitutional:   Pleasant Caucasian female appears to be in NAD, Well developed, Well nourished, alert and cooperative Head:  Normocephalic and atraumatic. Eyes:   PEERL, EOMI. No icterus.  Conjunctiva pink. Ears:  Normal auditory acuity. Neck/mouth:  Supple; white residue on surface of tongue Throat: Oral cavity and pharynx without inflammation, swelling or lesion.  Respiratory: Respirations even and unlabored. Lungs clear to auscultation bilaterally.  No wheezes, crackles, or rhonchi.  Cardiovascular: Normal S1, S2. No MRG. Regular rate and rhythm. No peripheral edema, cyanosis or pallor.  Gastrointestinal:  Soft, nondistended, nontender. No rebound or guarding. Normal bowel sounds. No appreciable masses or hepatomegaly. Rectal:  Not performed.  Msk:  Symmetrical without gross deformities. Without edema, no deformity or joint abnormality.  Neurologic:  Alert and  oriented x4;  grossly normal neurologically.  Skin:   Dry and intact without significant lesions or rashes. Psychiatric: Demonstrates good judgement and reason without abnormal affect or behaviors.  MOST RECENT LABS AND IMAGING: CBC    Component Value Date/Time   WBC 5.0 08/03/2017 1109   RBC 4.60 08/03/2017 1109   HGB 14.2 08/03/2017 1109   HCT 41.5 08/03/2017 1109   PLT 259.0 08/03/2017 1109   MCV 90.3 08/03/2017 1109   MCH 30.3 02/17/2016 1312   MCHC 34.2 08/03/2017 1109   RDW 14.6 08/03/2017 1109   LYMPHSABS 1.5 08/03/2017 1109   MONOABS 0.4 08/03/2017 1109   EOSABS 0.1 08/03/2017 1109   BASOSABS 0.1 08/03/2017 1109    CMP     Component Value Date/Time   NA 139 08/03/2017 1109   K 4.0 08/03/2017 1109   CL 102 08/03/2017 1109   CO2 29 08/03/2017 1109   GLUCOSE 102 (H) 08/03/2017 1109   BUN 13 08/03/2017 1109   CREATININE 0.94 08/03/2017 1109   CALCIUM 9.8 08/03/2017 1109   PROT 7.2 08/03/2017 1109   ALBUMIN 4.4 08/03/2017 1109   AST 17 08/03/2017 1109   ALT 15 08/03/2017 1109   ALKPHOS 27 (L) 08/03/2017 1109   BILITOT 0.6 08/03/2017 1109   GFRNONAA 55 (L) 02/17/2016 1312   GFRAA >60 02/17/2016 1312    Assessment: 1. Chronic  Cough: Chronic for the patient over the past year plus,  initially thought related to her history of bronchitis as it was some worse in the fall, but has had full work-up by pulmonology and also x-ray and CT, some improvement with PPI and H2 blocker, consideration for silent reflux 2. Thrush: Patient has had for several months  Plan: 1.  Scheduled patient for an EGD with Dr. Ardis Hughs in the [facility].  Did discuss risk, benefits, limitations and alternatives and the patient agrees to proceed. 2.  Continue Pantoprazole and Pepcid as prescribed 3.  Prescribed Nystatin suspension 4-6 mL's 4 times daily x14 days for oral candida 4.  Patient to follow in clinic per recommendations from Dr. Ardis Hughs after time of procedure.  Ellouise Newer, PA-C [facility] 10/26/2017, 11:10 AM  Cc: Binnie Rail, MD

## 2017-10-27 NOTE — Progress Notes (Signed)
I agree with the above note, plan 

## 2017-11-03 ENCOUNTER — Ambulatory Visit (AMBULATORY_SURGERY_CENTER): Payer: BLUE CROSS/BLUE SHIELD | Admitting: Gastroenterology

## 2017-11-03 ENCOUNTER — Encounter: Payer: Self-pay | Admitting: Gastroenterology

## 2017-11-03 VITALS — BP 104/58 | HR 61 | Temp 97.1°F | Resp 11 | Ht 67.0 in | Wt 164.0 lb

## 2017-11-03 DIAGNOSIS — R05 Cough: Secondary | ICD-10-CM

## 2017-11-03 DIAGNOSIS — R053 Chronic cough: Secondary | ICD-10-CM

## 2017-11-03 MED ORDER — SODIUM CHLORIDE 0.9 % IV SOLN: 500.0000 mL | Freq: Once | INTRAVENOUS | Status: DC

## 2017-11-03 NOTE — Patient Instructions (Signed)
   Start tapering antacid medicines to zero over 2-3 weeks ,go to once daily Pepcid and once daily Protonix now ,in one week -go to every other day Protonix and continue bedtime Pepcid,then in one week after that-stop the Pepcid as well.   YOU HAD AN ENDOSCOPIC PROCEDURE TODAY AT [facility]:   Refer to the procedure report that was given to you for any specific questions about what was found during the examination.  If the procedure report does not answer your questions, please call your gastroenterologist to clarify.  If you requested that your care partner not be given the details of your procedure findings, then the procedure report has been included in a sealed envelope for you to review at your convenience later.  YOU SHOULD EXPECT: Some feelings of bloating in the abdomen. Passage of more gas than usual.  Walking can help get rid of the air that was put into your GI tract during the procedure and reduce the bloating. If you had a lower endoscopy (such as a colonoscopy or flexible sigmoidoscopy) you may notice spotting of blood in your stool or on the toilet paper. If you underwent a bowel prep for your procedure, you may not have a normal bowel movement for a few days.  Please Note:  You might notice some irritation and congestion in your nose or some drainage.  This is from the oxygen used during your procedure.  There is no need for concern and it should clear up in a day or so.  SYMPTOMS TO REPORT IMMEDIATELY:     Following upper endoscopy (EGD)  Vomiting of blood or coffee ground material  New chest pain or pain under the shoulder blades  Painful or persistently difficult swallowing  New shortness of breath  Fever of 100F or higher  Black, tarry-looking stools  For urgent or emergent issues, a gastroenterologist can be reached at any hour by calling [phone].   DIET:  We do recommend a small meal at first, but then you may proceed to your regular diet.   Drink plenty of fluids but you should avoid alcoholic beverages for 24 hours.  ACTIVITY:  You should plan to take it easy for the rest of today and you should NOT DRIVE or use heavy machinery until tomorrow (because of the sedation medicines used during the test).    FOLLOW UP: Our staff will call the number listed on your records the next business day following your procedure to check on you and address any questions or concerns that you may have regarding the information given to you following your procedure. If we do not reach you, we will leave a message.  However, if you are feeling well and you are not experiencing any problems, there is no need to return our call.  We will assume that you have returned to your regular daily activities without incident.  If any biopsies were taken you will be contacted by phone or by letter within the next 1-3 weeks.  Please call us at [phone] if you have not heard about the biopsies in 3 weeks.    SIGNATURES/CONFIDENTIALITY: You and/or your care partner have signed paperwork which will be entered into your electronic medical record.  These signatures attest to the fact that that the information above on your After Visit Summary has been reviewed and is understood.  Full responsibility of the confidentiality of this discharge information lies with you and/or your care-partner.

## 2017-11-03 NOTE — Progress Notes (Signed)
Report given to PACU, vss 

## 2017-11-03 NOTE — Op Note (Signed)
[facility] Patient Name: Jean Pittman Procedure Date: 11/03/2017 10:27 AM MRN: [id number] Endoscopist: Milus Banister , MD Age: 57 Referring MD:  Date of Birth: 03/16/1960 Gender: Female Account #: [id number] Procedure:                Upper GI endoscopy Indications:              Chronic cough that is not significantly better                            while on BID PPI and BID H2 blocker; never pyrosis                            even off all antiacid medicines Medicines:                Monitored Anesthesia Care Procedure:                Pre-Anesthesia Assessment:                           - Prior to the procedure, a History and Physical                            was performed, and patient medications and                            allergies were reviewed. The patient's tolerance of                            previous anesthesia was also reviewed. The risks                            and benefits of the procedure and the sedation                            options and risks were discussed with the patient.                            All questions were answered, and informed consent                            was obtained. Prior Anticoagulants: The patient has                            taken no previous anticoagulant or antiplatelet                            agents. ASA Grade Assessment: II - A patient with                            mild systemic disease. After reviewing the risks                            and benefits, the patient was deemed in  satisfactory condition to undergo the procedure.                           After obtaining informed consent, the endoscope was                            passed under direct vision. Throughout the                            procedure, the patient's blood pressure, pulse, and                            oxygen saturations were monitored continuously. The                            Endoscope was introduced  through the mouth, and                            advanced to the second part of duodenum. The upper                            GI endoscopy was accomplished without difficulty.                            The patient tolerated the procedure well. Scope In: Scope Out: Findings:                 The esophagus was normal.                           The stomach was normal.                           The examined duodenum was normal. Complications:            No immediate complications. Estimated blood loss:                            None. Estimated Blood Loss:     Estimated blood loss: none. Impression:               - Normal esophagus.                           - Normal stomach.                           - Normal examined duodenum.                           - No specimens collected. Recommendation:           - Patient has a contact number available for                            emergencies. The signs and symptoms of potential  delayed complications were discussed with the                            patient. Return to normal activities tomorrow.                            Written discharge instructions were provided to the                            patient.                           - Resume previous diet.                           - Continue present medications.                           - Given lack of significant improvement on maximum                            medical antiacid therapy it is not clear if                            GERD/acid is playing a role in your chronic cough                            and so Dr. Ardis Hughs' office will arrange formal                            GERD/acid testing with a 48 hour wireless pH                            (Bravo) test while you are completely off antiacid                            medicines. Please start tapering your antiacid                            medicines to zero over 2-3 weeks and appt with be                             shortly after that. Go to once daily pepcid and                            once daily protonix now. In one week go to every                            other day protonix and continue bedtime pepcid. In                            one week from then stop protonix completely but  continue bedtime pepcid. In one week from then stop                            the pepcid completly as well. Milus Banister, MD 11/03/2017 10:44:24 AM This report has been signed electronically.

## 2017-11-06 ENCOUNTER — Telehealth: Payer: Self-pay | Admitting: Gastroenterology

## 2017-11-06 ENCOUNTER — Encounter: Payer: Self-pay | Admitting: Internal Medicine

## 2017-11-06 ENCOUNTER — Telehealth: Payer: Self-pay

## 2017-11-06 DIAGNOSIS — K219 Gastro-esophageal reflux disease without esophagitis: Secondary | ICD-10-CM

## 2017-11-06 NOTE — Telephone Encounter (Signed)
  Follow up Call-  Call back number 11/03/2017  Post procedure Call Back phone  # [phone]  Permission to leave phone message Yes  Some recent data might be hidden     Patient questions:  Do you have a fever, pain , or abdominal swelling? No. Pain Score  0 *  Have you tolerated food without any problems? Yes.    Have you been able to return to your normal activities? Yes.    Do you have any questions about your discharge instructions: Diet   No. Medications  No. Follow up visit  No.  Do you have questions or concerns about your Care? No.  Actions: * If pain score is 4 or above: No action needed, pain <4.

## 2017-11-06 NOTE — Telephone Encounter (Addendum)
Per procedure report the pt needs to have 48 hour wireless pH probe (Bravo) while completely off of PPI/antacid.  She is weaning off now and will need testing in 2-3 weeks.  Pt aware I will call her tomorrow to set up.    You have been scheduled for a 48 Hour pH Probe test at [facility] on 9/19 at 10:30 am . Please arrive at 9 am prior to your procedure for registration. You will need to go to outpatient registration (1st floor of the hospital) first. Make certain to bring your insurance cards as well as a complete list of medications.  Please remember the following:  1) Do not take any muscle relaxants, xanax (alprazolam) or ativan for 1 day prior to your test as well as the day of the test.  2) Nothing to eat or drink after 12:00 midnight on the night before your test.  Off ALL antacid medications as discussed with Dr Ardis Hughs.   It will take at least 2 weeks to receive the results of this test from your physician.  ABOUT 48 HOUR PH PROBE An esophageal pH test measures and records the pH in your esophagus to determine if you have gastroesophageal reflux disease (GERD). The test can also be done to determine the effectiveness of medications or surgical treatment for GERD.  What is esophageal reflux? Esophageal reflux is a condition in which stomach acid refluxes or moves back into the esophagus (the "food pipe" leading from the mouth to the stomach).  How does the esophageal pH test work? A thin, small tube with an acid sensing device on the tip is gently passed through your nose, down the esophagus ("food tube"), and positioned about 2 inches above the lower esophageal sphincter. The tube is secured to the side of your face with clear tape. The end of the tube exiting from your nose is attached to a portable recorder that is worn on your belt or over your shoulder. The recorder has several buttons on it that you will press to mark certain events. A nurse will review the monitoring  instructions with you.  Once the test has begun, what do I need to know and do? Activity: Follow your usual daily routine. Do not reduce or change your activities during the monitoring period. Doing so can make the monitoring results less useful.  Note: do not take a tub bath or shower; the equipment can't get wet.  Eating: Eat your regular meals at the usual times. If you do not eat during the monitoring period, your stomach will not produce acid as usual, and the test results will not be accurate. Eat at least 2 meals a day. Eat foods that tend to increase your symptoms (without making yourself miserable). Avoid snacking. Do not suck on hard candy or lozenges and do not chew gum during the monitoring period.  Lying down: Remain upright throughout the day. Do not lie down until you go to bed (unless napping or lying down during the day is part of your daily routine).  Medications: Continue to follow your doctor's advice regarding medications to avoid during the monitoring period.  Recording symptoms: Press the appropriate button on your recorder when symptoms occur (as discussed with the nurse).  Recording events: Record the time you start and stop eating and drinking (anything other than plain water). Record the time you lie down (even if just resting) and when you get back up. The nurse will explain this.  Unusual symptoms or side  effects. If you think you may be experiencing any unusual symptoms or side effects, call your doctor.   You will return in 48 hours to have the tube removed. The information on the recorder will be downloaded to a computer and the results will be analyzed.   After completion of the study Resume your normal diet and medications. Lozenges or hard candy may help ease any sore throat caused by the tube.

## 2017-11-07 ENCOUNTER — Other Ambulatory Visit: Payer: Self-pay

## 2017-11-07 DIAGNOSIS — K219 Gastro-esophageal reflux disease without esophagitis: Secondary | ICD-10-CM

## 2017-11-07 NOTE — Telephone Encounter (Signed)
The appt was moved to 11/30/17 at 730 am the pt has been advised and instructed and will call with any concerns

## 2017-11-30 ENCOUNTER — Ambulatory Visit (HOSPITAL_COMMUNITY): Payer: BLUE CROSS/BLUE SHIELD | Admitting: Anesthesiology

## 2017-11-30 ENCOUNTER — Other Ambulatory Visit: Payer: Self-pay

## 2017-11-30 ENCOUNTER — Ambulatory Visit (HOSPITAL_COMMUNITY)
Admission: RE | Admit: 2017-11-30 | Discharge: 2017-11-30 | Disposition: A | Discharge status: Home / Self Care | Payer: BLUE CROSS/BLUE SHIELD | Source: Ambulatory Visit | Attending: Gastroenterology | Admitting: Gastroenterology

## 2017-11-30 ENCOUNTER — Encounter (HOSPITAL_COMMUNITY): Payer: Self-pay | Admitting: *Deleted

## 2017-11-30 ENCOUNTER — Encounter (HOSPITAL_COMMUNITY)
Admission: RE | Disposition: A | Discharge status: Home / Self Care | Payer: Self-pay | Source: Ambulatory Visit | Attending: Gastroenterology

## 2017-11-30 DIAGNOSIS — R05 Cough: Secondary | ICD-10-CM

## 2017-11-30 DIAGNOSIS — Z803 Family history of malignant neoplasm of breast: Secondary | ICD-10-CM | POA: Diagnosis not present

## 2017-11-30 DIAGNOSIS — R42 Dizziness and giddiness: Secondary | ICD-10-CM | POA: Insufficient documentation

## 2017-11-30 DIAGNOSIS — K219 Gastro-esophageal reflux disease without esophagitis: Secondary | ICD-10-CM

## 2017-11-30 DIAGNOSIS — Z86010 Personal history of colonic polyps: Secondary | ICD-10-CM | POA: Insufficient documentation

## 2017-11-30 DIAGNOSIS — M1990 Unspecified osteoarthritis, unspecified site: Secondary | ICD-10-CM | POA: Insufficient documentation

## 2017-11-30 DIAGNOSIS — E785 Hyperlipidemia, unspecified: Secondary | ICD-10-CM | POA: Insufficient documentation

## 2017-11-30 DIAGNOSIS — Z833 Family history of diabetes mellitus: Secondary | ICD-10-CM | POA: Diagnosis not present

## 2017-11-30 DIAGNOSIS — R053 Chronic cough: Secondary | ICD-10-CM

## 2017-11-30 DIAGNOSIS — Z823 Family history of stroke: Secondary | ICD-10-CM | POA: Insufficient documentation

## 2017-11-30 HISTORY — PX: BRAVO PH STUDY: SHX5421

## 2017-11-30 HISTORY — PX: ESOPHAGOGASTRODUODENOSCOPY (EGD) WITH PROPOFOL: SHX5813

## 2017-11-30 SURGERY — ESOPHAGOGASTRODUODENOSCOPY (EGD) WITH PROPOFOL
Anesthesia: Monitor Anesthesia Care

## 2017-11-30 MED ORDER — PROPOFOL 10 MG/ML IV BOLUS
INTRAVENOUS | Status: DC | PRN
  Administered 2017-11-30 (×3): 50 mg via INTRAVENOUS
  Administered 2017-11-30: 20 mg via INTRAVENOUS
  Administered 2017-11-30: 50 mg via INTRAVENOUS

## 2017-11-30 MED ORDER — PROPOFOL 10 MG/ML IV BOLUS
INTRAVENOUS | Status: AC
  Filled 2017-11-30: qty 40

## 2017-11-30 MED ORDER — LIDOCAINE 2% (20 MG/ML) 5 ML SYRINGE
INTRAMUSCULAR | Status: DC | PRN
  Administered 2017-11-30: 40 mg via INTRAVENOUS

## 2017-11-30 MED ORDER — LACTATED RINGERS IV SOLN
INTRAVENOUS | Status: DC
  Administered 2017-11-30 (×2): via INTRAVENOUS

## 2017-11-30 MED ORDER — SODIUM CHLORIDE 0.9 % IV SOLN: INTRAVENOUS | Status: DC

## 2017-11-30 SURGICAL SUPPLY — 14 items

## 2017-11-30 NOTE — Transfer of Care (Signed)
Immediate Anesthesia Transfer of Care Note  Patient: Jean Pittman  Procedure(s) Performed: ESOPHAGOGASTRODUODENOSCOPY (EGD) WITH PROPOFOL (N/A ) BRAVO PH STUDY (N/A )  Patient Location: PACU and Endoscopy Unit  Anesthesia Type:MAC  Level of Consciousness: alert   Airway & Oxygen Therapy: Patient Spontanous Breathing and Patient connected to nasal cannula oxygen  Post-op Assessment: Report given to RN and Post -op Vital signs reviewed and stable  Post vital signs: Reviewed and stable  Last Vitals:  Vitals Value Taken Time  BP 93/59 11/30/2017  7:59 AM  Temp    Pulse 78 11/30/2017  8:00 AM  Resp 14 11/30/2017  8:00 AM  SpO2 100 % 11/30/2017  8:00 AM  Vitals shown include unvalidated device data.  Last Pain:  Vitals:   11/30/17 0759  TempSrc:   PainSc: 0-No pain         Complications: No apparent anesthesia complications

## 2017-11-30 NOTE — Anesthesia Preprocedure Evaluation (Signed)
Anesthesia Evaluation  Patient identified by MRN, date of birth, ID band Patient awake    Reviewed: Allergy & Precautions, NPO status , Patient's Chart, lab work & pertinent test results  Airway Mallampati: II  TM Distance: >3 FB Neck ROM: Full    Dental  (+) Teeth Intact, Dental Advisory Given   Pulmonary neg pulmonary ROS,    Pulmonary exam normal breath sounds clear to auscultation       Cardiovascular Exercise Tolerance: Good negative cardio ROS Normal cardiovascular exam Rhythm:Regular Rate:Normal     Neuro/Psych negative neurological ROS  negative psych ROS   GI/Hepatic Neg liver ROS, GERD  ,  Endo/Other  negative endocrine ROS  Renal/GU negative Renal ROS     Musculoskeletal  (+) Arthritis ,   Abdominal   Peds  Hematology negative hematology ROS (+)   Anesthesia Other Findings Day of surgery medications reviewed with the patient.  Reproductive/Obstetrics                             Anesthesia Physical Anesthesia Plan  ASA: II  Anesthesia Plan: MAC   Post-op Pain Management:    Induction: Intravenous  PONV Risk Score and Plan: 2 and Propofol infusion and Treatment may vary due to age or medical condition  Airway Management Planned: Natural Airway and Simple Face Mask  Additional Equipment:   Intra-op Plan:   Post-operative Plan:   Informed Consent: I have reviewed the patients History and Physical, chart, labs and discussed the procedure including the risks, benefits and alternatives for the proposed anesthesia with the patient or authorized representative who has indicated his/her understanding and acceptance.   Dental advisory given  Plan Discussed with: CRNA and Anesthesiologist  Anesthesia Plan Comments:         Anesthesia Quick Evaluation

## 2017-11-30 NOTE — Discharge Instructions (Signed)
Esophagogastroduodenoscopy, Care After °Refer to this sheet in the next few weeks. These instructions provide you with information about caring for yourself after your procedure. Your health care provider may also give you more specific instructions. Your treatment has been planned according to current medical practices, but problems sometimes occur. Call your health care provider if you have any problems or questions after your procedure. °What can I expect after the procedure? °After the procedure, it is common to have: °· A sore throat. °· Nausea. °· Bloating. °· Dizziness. °· Fatigue. ° °Follow these instructions at home: °· Do not eat or drink anything until the numbing medicine (local anesthetic) has worn off and your gag reflex has returned. You will know that the local anesthetic has worn off when you can swallow comfortably. °· Do not drive for 24 hours if you received a medicine to help you relax (sedative). °· If your health care provider took a tissue sample for testing during the procedure, make sure to get your test results. This is your responsibility. Ask your health care provider or the department performing the test when your results will be ready. °· Keep all follow-up visits as told by your health care provider. This is important. °Contact a health care provider if: °· You cannot stop coughing. °· You are not urinating. °· You are urinating less than usual. °Get help right away if: °· You have trouble swallowing. °· You cannot eat or drink. °· You have throat or chest pain that gets worse. °· You are dizzy or light-headed. °· You faint. °· You have nausea or vomiting. °· You have chills. °· You have a fever. °· You have severe abdominal pain. °· You have black, tarry, or bloody stools. °This information is not intended to replace advice given to you by your health care provider. Make sure you discuss any questions you have with your health care provider. °Document Released: 02/01/2012 Document  Revised: 07/23/2015 Document Reviewed: 01/08/2015 °Elsevier Interactive Patient Education © 2018 [employer] ° °

## 2017-11-30 NOTE — Anesthesia Procedure Notes (Signed)
Procedure Name: MAC Date/Time: 11/30/2017 7:32 AM Performed by: Cynda Familia, CRNA Pre-anesthesia Checklist: Patient identified, Emergency Drugs available, Suction available, Patient being monitored and Timeout performed Patient Re-evaluated:Patient Re-evaluated prior to induction Oxygen Delivery Method: Nasal cannula Placement Confirmation: positive ETCO2 and breath sounds checked- equal and bilateral Dental Injury: Teeth and Oropharynx as per pre-operative assessment  Comments: Bite block by GI tech

## 2017-11-30 NOTE — H&P (Signed)
HPI: This is a 57 yo woman with chronic cough  Chief complaint is chronic cough  ROS: complete GI ROS as described in HPI, all other review negative.  Constitutional:  No unintentional weight loss   Past Medical History:  Diagnosis Date  . Arthritis   . Dizziness   . Hyperlipidemia    denies  . Tubular adenoma of colon 03/30/09   Dr Sharlett Iles    Past Surgical History:  Procedure Laterality Date  . APPENDECTOMY     with 1st pregnancy  . BREAST BIOPSY     needle aspiration X1; resection X 1  . colonoscopy with polypectomy  03/30/2009   Dr Sharlett Iles  . CT SCAN  2017   Chest due to a possible blood clot but came back normal  . frozen shoulder Left    broke the cartilage-was put to sleep Dr. Joni Fears apox 2014  . G 3 P 3     3 C sections  . TUBAL LIGATION      Current Facility-Administered Medications  Medication Dose Route Frequency Provider Last Rate Last Dose  . lactated ringers infusion   Intravenous Continuous Milus Banister, MD 20 mL/hr at 11/30/17 0086      Allergies as of 11/07/2017  . (No Known Allergies)    Family History  Problem Relation Age of Onset  . Breast cancer Maternal Aunt   . Diabetes Maternal Aunt   . Stroke Maternal Aunt        ? . one M aunt;> 65  . Heart disease Neg Hx   . Colon cancer Neg Hx     Social History   Socioeconomic History  . Marital status: Married    Spouse name: Not on file  . Number of children: Not on file  . Years of education: Not on file  . Highest education level: Not on file  Occupational History  . Not on file  Social Needs  . Financial resource strain: Not on file  . Food insecurity:    Worry: Not on file    Inability: Not on file  . Transportation needs:    Medical: Not on file    Non-medical: Not on file  Tobacco Use  . Smoking status: Never Smoker  . Smokeless tobacco: Never Used  Substance and Sexual Activity  . Alcohol use: No    Comment: rare  . Drug use: No  . Sexual activity:  Yes    Partners: Male    Comment: 1st intercourse- 42, partners- 2, married- 37 yrs   Lifestyle  . Physical activity:    Days per week: Not on file    Minutes per session: Not on file  . Stress: Not on file  Relationships  . Social connections:    Talks on phone: Not on file    Gets together: Not on file    Attends religious service: Not on file    Active member of club or organization: Not on file    Attends meetings of clubs or organizations: Not on file    Relationship status: Not on file  . Intimate partner violence:    Fear of current or ex partner: Not on file    Emotionally abused: Not on file    Physically abused: Not on file    Forced sexual activity: Not on file  Other Topics Concern  . Not on file  Social History Narrative   Exercises regularly     Physical Exam: BP 123/60   Pulse Marland Kitchen)  69   Temp 97.6 F (36.4 C) (Oral)   Resp 18   Ht 5\' 7"  (1.702 m)   Wt 74.4 kg   LMP 07/01/2013   SpO2 100%   BMI 25.69 kg/m  Constitutional: generally well-appearing Psychiatric: alert and oriented x3 Abdomen: soft, nontender, nondistended, no obvious ascites, no peritoneal signs, normal bowel sounds No peripheral edema noted in lower extremities  Assessment and plan: 57 y.o. female with chronic cough  ? Relation to GERD.  48 hour wireless Bravo test today, placed endoscopically.   Please see the "Patient Instructions" section for addition details about the plan.  Owens Loffler, MD [facility] 11/30/2017, 7:15 AM

## 2017-11-30 NOTE — Op Note (Signed)
[facility] Patient Name: Jean Pittman Procedure Date: 11/30/2017 MRN: [id number] Attending MD: Milus Banister , MD Date of Birth: 1960-04-27 CSN: [id number] Age: 57 Admit Type: Outpatient Procedure:                Upper GI endoscopy Indications:              Chronic cough, ? underlying acid regurge; She has                            been completely off all anit acid meds for 1 week                            now. Noticed no worsening of her cough while                            tapering anti-acid medicines off. Providers:                Milus Banister, MD, Cleda Daub, RN, Elspeth Cho Tech., Technician, Glenis Smoker, CRNA Referring MD:              Medicines:                Monitored Anesthesia Care Complications:            No immediate complications. Estimated blood loss:                            None. Estimated Blood Loss:     Estimated blood loss: none. Procedure:                Pre-Anesthesia Assessment:                           - Prior to the procedure, a History and Physical                            was performed, and patient medications and                            allergies were reviewed. The patient's tolerance of                            previous anesthesia was also reviewed. The risks                            and benefits of the procedure and the sedation                            options and risks were discussed with the patient.                            All questions were answered, and informed consent  was obtained. Prior Anticoagulants: The patient has                            taken no previous anticoagulant or antiplatelet                            agents. ASA Grade Assessment: II - A patient with                            mild systemic disease. After reviewing the risks                            and benefits, the patient was deemed in                             satisfactory condition to undergo the procedure.                           After obtaining informed consent, the endoscope was                            passed under direct vision. Throughout the                            procedure, the patient's blood pressure, pulse, and                            oxygen saturations were monitored continuously. The                            GIF-H190 ([id number] Olympus adult endoscope was                            introduced through the mouth, and advanced to the                            second part of duodenum. The upper GI endoscopy was                            accomplished without difficulty. The patient                            tolerated the procedure well. Scope In: Scope Out: Findings:      The esophagus was normal.      The stomach was normal.      The examined duodenum was normal.      The z-line was normal appearing, located 40cm from the incisors.       Following the examination a Bravo 48 hour wireless pH probe was affixed       to the wall of the distal esophagus at 34cm from the incisors, per       protocol. The probe position was confirmed after placement by       reintroducing the gastroscope into the esophagus. Impression:               -  Normal examination.                           - Bravo 48 hour wireless pH probe was placed 6cm                            proximal to the GE junction. Moderate Sedation:      N/A- Per Anesthesia Care Recommendation:           - Resume previous diet.                           - Continue present medications.                           - Continue avoiding all antiacid medicines.                           - Dr. Ardis Hughs will contact you after the pH data is                            retrieved and analyzed. Procedure Code(s):        --- Professional ---                           [phone], Esophagogastroduodenoscopy, flexible,                            transoral; diagnostic, including collection of                             specimen(s) by brushing or washing, when performed                            (separate procedure) Diagnosis Code(s):        --- Professional ---                           R05, Cough CPT copyright 2017 American [employer] Association. All rights reserved. The codes documented in this report are preliminary and upon coder review may  be revised to meet current compliance requirements. Milus Banister, MD 11/30/2017 8:09:58 AM This report has been signed electronically. Number of Addenda: 0

## 2017-12-01 ENCOUNTER — Encounter (HOSPITAL_COMMUNITY): Payer: Self-pay | Admitting: Gastroenterology

## 2017-12-01 ENCOUNTER — Telehealth: Payer: Self-pay | Admitting: Gastroenterology

## 2017-12-01 NOTE — Telephone Encounter (Signed)
The pt was advised to stay off of her PPI and return the equipment on Monday.  She was advised that the probe sloughs off by itself and she does not need to retrieve it.  The pt has been advised of the information and verbalized understanding.

## 2017-12-01 NOTE — Anesthesia Postprocedure Evaluation (Signed)
Anesthesia Post Note  Patient: Jean Pittman  Procedure(s) Performed: ESOPHAGOGASTRODUODENOSCOPY (EGD) WITH PROPOFOL (N/A ) BRAVO PH STUDY (N/A )     Patient location during evaluation: PACU Anesthesia Type: MAC Level of consciousness: awake and alert, oriented and awake Pain management: pain level controlled Vital Signs Assessment: post-procedure vital signs reviewed and stable Respiratory status: spontaneous breathing, nonlabored ventilation and respiratory function stable Cardiovascular status: stable and blood pressure returned to baseline Postop Assessment: no apparent nausea or vomiting Anesthetic complications: no    Last Vitals:  Vitals:   11/30/17 0810 11/30/17 0820  BP:  122/74  Pulse: 84 71  Resp: 16 (!) 23  Temp:    SpO2: 100% 98%    Last Pain:  Vitals:   11/30/17 0820  TempSrc:   PainSc: 0-No pain                 Catalina Gravel

## 2017-12-14 ENCOUNTER — Telehealth: Payer: Self-pay | Admitting: Gastroenterology

## 2017-12-14 NOTE — Telephone Encounter (Signed)
appt with Dr Ardis Hughs 01/23/18 230 pm.  Pt notified via My Chart

## 2017-12-14 NOTE — Telephone Encounter (Signed)
   We spoke about her Bravo 48-hour wireless pH study results.  She does not have pathologic amount of acid refluxing onto her esophagus.  Her symptoms of cough and gagging, vomiting are however significantly, statistically correlating with the reflux events that she is having.  I think this argues that gastroesophageal reflux, particularly on acid related components are definitely contributing to her coughing and gagging.  She is not going to resume antiacid medicines for now.  They never made a difference for her anyway.  She has been modifying her diet especially cutting out a lot of caffeine related products and has noticed her cough is improving.  She still is bothered by coughing on a chili morning walk and that may point to contributing other factors.  Patty, She needs follow-up with me in 6 to 7 weeks in the office to see how she is doing.  At that point I would consider referral for fundoplication, tightening of her GE junction prior to prevent reflux events.  At this point she is inclined not to have any surgeries as long as her symptoms are manageable.

## 2017-12-21 DIAGNOSIS — J0100 Acute maxillary sinusitis, unspecified: Secondary | ICD-10-CM | POA: Diagnosis not present

## 2017-12-25 ENCOUNTER — Encounter: Payer: Self-pay | Admitting: Obstetrics & Gynecology

## 2017-12-25 ENCOUNTER — Ambulatory Visit: Payer: BLUE CROSS/BLUE SHIELD | Admitting: Obstetrics & Gynecology

## 2017-12-25 VITALS — BP 126/84 | Ht 66.0 in | Wt 160.4 lb

## 2017-12-25 DIAGNOSIS — Z780 Asymptomatic menopausal state: Secondary | ICD-10-CM

## 2017-12-25 DIAGNOSIS — Z01419 Encounter for gynecological examination (general) (routine) without abnormal findings: Secondary | ICD-10-CM

## 2017-12-25 NOTE — Progress Notes (Signed)
Jean Pittman November 23, 1960 [id number]   History:    57 y.o. G3P3L3 Married  RP:  Established patient presenting for annual gyn exam   HPI: Menopause, well on no HRT.  No PMB.  No pelvic pain.  Urine and bowel movements normal.  Sexually active but no intercourse because of erectile dysfunction.  Breasts normal.  Body mass index 25.89.  Physically active.  Health labs with family physician.  Colonoscopy 2016.  Past medical history,surgical history, family history and social history were all reviewed and documented in the EPIC chart.  Gynecologic History Patient's last menstrual period was 07/01/2013. Contraception: post menopausal status Last Pap: 11/2016. Results were: Negative/HPV HR negative Last mammogram: 2018. Results were: Will obtain report from [place] Density: 12/2016 Normal Colonoscopy: 2016  Obstetric History OB History  Gravida Para Term Preterm AB Living  3 3       3   SAB TAB Ectopic Multiple Live Births               # Outcome Date GA Lbr Len/2nd Weight Sex Delivery Anes PTL Lv  3 Para           2 Para           1 Para              ROS: A ROS was performed and pertinent positives and negatives are included in the history.  GENERAL: No fevers or chills. HEENT: No change in vision, no earache, sore throat or sinus congestion. NECK: No pain or stiffness. CARDIOVASCULAR: No chest pain or pressure. No palpitations. PULMONARY: No shortness of breath, cough or wheeze. GASTROINTESTINAL: No abdominal pain, nausea, vomiting or diarrhea, melena or bright red blood per rectum. GENITOURINARY: No urinary frequency, urgency, hesitancy or dysuria. MUSCULOSKELETAL: No joint or muscle pain, no back pain, no recent trauma. DERMATOLOGIC: No rash, no itching, no lesions. ENDOCRINE: No polyuria, polydipsia, no heat or cold intolerance. No recent change in weight. HEMATOLOGICAL: No anemia or easy bruising or bleeding. NEUROLOGIC: No headache, seizures, numbness, tingling or  weakness. PSYCHIATRIC: No depression, no loss of interest in normal activity or change in sleep pattern.     Exam:   BP 126/84   Ht 5\' 6"  (1.676 m)   Wt 160 lb 6.4 oz (72.8 kg)   LMP 07/01/2013   BMI 25.89 kg/m   Body mass index is 25.89 kg/m.  General appearance : Well developed well nourished female. No acute distress HEENT: Eyes: no retinal hemorrhage or exudates,  Neck supple, trachea midline, no carotid bruits, no thyroidmegaly Lungs: Clear to auscultation, no rhonchi or wheezes, or rib retractions  Heart: Regular rate and rhythm, no murmurs or gallops Breast:Examined in sitting and supine position were symmetrical in appearance, no palpable masses or tenderness,  no skin retraction, no nipple inversion, no nipple discharge, no skin discoloration, no axillary or supraclavicular lymphadenopathy Abdomen: no palpable masses or tenderness, no rebound or guarding Extremities: no edema or skin discoloration or tenderness  Pelvic: Vulva: Normal             Vagina: No gross lesions or discharge  Cervix: No gross lesions or discharge  Uterus  AV, normal size, shape and consistency, non-tender and mobile  Adnexa  Without masses or tenderness  Anus: Normal   Assessment/Plan:  57 y.o. female for annual exam   1. Well female exam with routine gynecological exam Normal gynecologic exam and menopause.  Pap test negative with negative high-risk HPV October 2018.  No indication to repeat this year.  Breast exam normal.  Screening mammogram to schedule this year.  Colonoscopy in 2016.  Health labs with family physician.  2. Postmenopausal Well on no hormone replacement therapy.  No postmenopausal bleeding.  Bone density normal in November 2018.  Will repeat at 5 years.  Vitamin D supplements, calcium intake of 1.5 g/day and regular weightbearing physical activity recommended.  Princess Bruins MD, 9:40 AM 12/25/2017

## 2017-12-26 ENCOUNTER — Other Ambulatory Visit: Payer: Self-pay | Admitting: Obstetrics & Gynecology

## 2017-12-26 DIAGNOSIS — Z1231 Encounter for screening mammogram for malignant neoplasm of breast: Secondary | ICD-10-CM

## 2017-12-27 ENCOUNTER — Encounter: Payer: Self-pay | Admitting: Obstetrics & Gynecology

## 2017-12-27 NOTE — Patient Instructions (Signed)
1. Well female exam with routine gynecological exam Normal gynecologic exam and menopause.  Pap test negative with negative high-risk HPV October 2018.  No indication to repeat this year.  Breast exam normal.  Screening mammogram to schedule this year.  Colonoscopy in 2016.  Health labs with family physician.  2. Postmenopausal Well on no hormone replacement therapy.  No postmenopausal bleeding.  Bone density normal in November 2018.  Will repeat at 5 years.  Vitamin D supplements, calcium intake of 1.5 g/day and regular weightbearing physical activity recommended.  Jean Pittman, it was a pleasure seeing you today!

## 2018-01-23 ENCOUNTER — Encounter: Payer: Self-pay | Admitting: Gastroenterology

## 2018-01-23 ENCOUNTER — Ambulatory Visit: Payer: BLUE CROSS/BLUE SHIELD | Admitting: Gastroenterology

## 2018-01-23 VITALS — BP 104/70 | HR 68 | Ht 66.0 in | Wt 161.5 lb

## 2018-01-23 DIAGNOSIS — K219 Gastro-esophageal reflux disease without esophagitis: Secondary | ICD-10-CM

## 2018-01-23 NOTE — Progress Notes (Signed)
Review of pertinent gastrointestinal problems: 1. Routine risk for colon cancer: 07/16/2014 colonoscopy for surveillance due to prior colonic neoplasia with finding of a sessile polyp in the cecum and otherwise normal exam.  Pathology showed benign colonic mucosa.  Repeat colonoscopy recommended in 10 years. 2. Chronic cough, ? GERD related. No improvement on maximum strength medical therapy (BID PPI and bedtime H2 blocker).  Never typical acid symptoms (on or off meds).  EGD 10/2017 Dr. Ardis Hughs was normal.  11/2017 Bravo pH testing OFF meds show NO pathologic acid reflux but her symptom of cough, gagging, vomiting correlated very well with reflux events.  Cold morning air and chemicals that she was around during a craft project also significantly cause her to cough.    HPI: This is a very pleasant 57 year old woman who is here with her husband today.  She cut out some caffeine containing compounds.  Cut out a lot of chocolate.  She completely stopped antiacid medicines.  Stopped a protein powder drink this past spring.  Laying down, rolling onto her right side gives her the urge to cough.  ENt evaluation with examination, remotely, she was told it was normal.    Chief complaint is chronic cough  ROS: complete GI ROS as described in HPI, all other review negative.  Constitutional:  No unintentional weight loss   Past Medical History:  Diagnosis Date  . Arthritis   . Dizziness   . Hyperlipidemia    denies  . Tubular adenoma of colon 03/30/09   Dr Sharlett Iles    Past Surgical History:  Procedure Laterality Date  . APPENDECTOMY     with 1st pregnancy  . BRAVO [place] STUDY N/A 11/30/2017   Procedure: BRAVO [place];  Surgeon: Milus Banister, MD;  Location: [facility] ENDOSCOPY;  Service: Endoscopy;  Laterality: N/A;  . BREAST BIOPSY     needle aspiration X1; resection X 1  . colonoscopy with polypectomy  03/30/2009   Dr Sharlett Iles  . CT SCAN  2017   Chest due to a possible blood clot but came  back normal  . ESOPHAGOGASTRODUODENOSCOPY (EGD) WITH PROPOFOL N/A 11/30/2017   Procedure: ESOPHAGOGASTRODUODENOSCOPY (EGD) WITH PROPOFOL;  Surgeon: Milus Banister, MD;  Location: [facility] ENDOSCOPY;  Service: Endoscopy;  Laterality: N/A;  48 hour Ph probe  . frozen shoulder Left    broke the cartilage-was put to sleep Dr. Joni Fears apox 2014  . G 3 P 3     3 C sections  . TUBAL LIGATION      Current Outpatient Medications  Medication Sig Dispense Refill  . Calcium Carb-Cholecalciferol (CALCIUM 600 + D PO) Take 1 tablet by mouth daily.    . cetirizine (ZYRTEC) 10 MG tablet Take 10 mg by mouth daily.    . Magnesium 400 MG CAPS Take 1 capsule by mouth daily.    Marland Kitchen MELATONIN PO Take 1 tablet by mouth at bedtime as needed (sleep).     . naproxen sodium (ALEVE) 220 MG tablet Take 220 mg by mouth daily as needed (pain).      No current facility-administered medications for this visit.     Allergies as of 01/23/2018  . (No Known Allergies)    Family History  Problem Relation Age of Onset  . Breast cancer Maternal Aunt   . Diabetes Maternal Aunt   . Stroke Maternal Aunt        ? . one M aunt;> 65  . Heart disease Neg Hx   . Colon cancer Neg  Hx     Social History   Socioeconomic History  . Marital status: Married    Spouse name: Not on file  . Number of children: Not on file  . Years of education: Not on file  . Highest education level: Not on file  Occupational History  . Not on file  Social Needs  . Financial resource strain: Not on file  . Food insecurity:    Worry: Not on file    Inability: Not on file  . Transportation needs:    Medical: Not on file    Non-medical: Not on file  Tobacco Use  . Smoking status: Never Smoker  . Smokeless tobacco: Never Used  Substance and Sexual Activity  . Alcohol use: No    Comment: rare  . Drug use: No  . Sexual activity: Yes    Partners: Male    Comment: 1st intercourse- 39, partners- 2, married- 36 yrs   Lifestyle  .  Physical activity:    Days per week: Not on file    Minutes per session: Not on file  . Stress: Not on file  Relationships  . Social connections:    Talks on phone: Not on file    Gets together: Not on file    Attends religious service: Not on file    Active member of club or organization: Not on file    Attends meetings of clubs or organizations: Not on file    Relationship status: Not on file  . Intimate partner violence:    Fear of current or ex partner: Not on file    Emotionally abused: Not on file    Physically abused: Not on file    Forced sexual activity: Not on file  Other Topics Concern  . Not on file  Social History Narrative   Exercises regularly     Physical Exam: BP 104/70 (BP Location: Left Arm, Patient Position: Sitting, Cuff Size: Normal)   Pulse 68   Ht _0  (1.676 m)   Wt 161 lb 8 oz (73.3 kg)   LMP 07/01/2013   BMI 26.07 kg/m  Constitutional: generally well-appearing Psychiatric: alert and oriented x3 Abdomen: soft, nontender, nondistended, no obvious ascites, no peritoneal signs, normal bowel sounds No peripheral edema noted in lower extremities  Assessment and plan: 57 y.o. female with chronic cough  She does not have pathologic acid reflux wireless pH testing.  Her cough symptoms have never been better while on maximum strength MAC antiacid medicine for several weeks, months.  She never has typical GERD symptoms of heartburn, pyrosis, acid regurg.  Currently she is off of all antiacid medicines.  Since cutting back on some caffeine products and stopping a protein shake she feels her cough may be a little bit better.  I recommended she try cutting back on chocolate as well since she tends to have some at night and perhaps raise the head of her bed a bit.  Possibly it is non-acid reflux that is causing some of her symptoms.  Please see the "Patient Instructions" section for addition details about the plan.  Owens Loffler, MD [facility] 01/23/2018, 2:22 PM

## 2018-01-23 NOTE — Patient Instructions (Addendum)
Call if any new, concerning GI problems.  Try cutting out chocolate and/or raising head of the bed for now.  Thank you for entrusting me with your care and choosing [facility].  Dr Ardis Hughs

## 2018-02-08 ENCOUNTER — Ambulatory Visit
Admission: RE | Admit: 2018-02-08 | Discharge: 2018-02-08 | Disposition: A | Discharge status: Home / Self Care | Payer: BLUE CROSS/BLUE SHIELD | Source: Ambulatory Visit | Attending: Obstetrics & Gynecology | Admitting: Obstetrics & Gynecology

## 2018-02-08 DIAGNOSIS — Z1231 Encounter for screening mammogram for malignant neoplasm of breast: Secondary | ICD-10-CM | POA: Diagnosis not present

## 2018-07-20 DIAGNOSIS — H52203 Unspecified astigmatism, bilateral: Secondary | ICD-10-CM | POA: Diagnosis not present

## 2018-07-20 DIAGNOSIS — H5203 Hypermetropia, bilateral: Secondary | ICD-10-CM | POA: Diagnosis not present

## 2018-07-20 DIAGNOSIS — H10413 Chronic giant papillary conjunctivitis, bilateral: Secondary | ICD-10-CM | POA: Diagnosis not present

## 2018-09-03 DIAGNOSIS — L559 Sunburn, unspecified: Secondary | ICD-10-CM | POA: Diagnosis not present

## 2018-09-03 DIAGNOSIS — M722 Plantar fascial fibromatosis: Secondary | ICD-10-CM | POA: Diagnosis not present

## 2018-09-20 DIAGNOSIS — R739 Hyperglycemia, unspecified: Secondary | ICD-10-CM | POA: Insufficient documentation

## 2018-09-20 NOTE — Assessment & Plan Note (Addendum)
Diet controlled Check CMP, lipid panel, TSH

## 2018-09-20 NOTE — Assessment & Plan Note (Addendum)
Check A1c. 

## 2018-09-20 NOTE — Progress Notes (Signed)
Subjective:    Patient ID: Jean Pittman, female    DOB: 1961/02/22, 58 y.o.   MRN: [id number]  HPI She is here for a physical exam.   Rash on eye, nose - thinks it is from the mask she was wearing earlier this week.  Has not taken anything for it or applying anything on it.  She has a couple of little bumps on her nose, near her iron on her neck.  She also has a couple of other little bumps on her lower back and right arm.  All have gotten better.  She has no concerns.  Her chronic cough has improved for no obvious reason-it is much more tolerable.  She did have an endoscopy and there is no evidence of GERD.  She thinks she does have some postnasal drip.  She does use 2 pillows when she sleeps, which may help a little bit, but overall the cough is better.   Medications and allergies reviewed with patient and updated if appropriate.  Patient Active Problem List   Diagnosis Date Noted  . Hyperglycemia 09/20/2018  . Gastroesophageal reflux disease   . Fatigue 08/03/2017  . Upper airway cough syndrome 01/10/2017  . SOB (shortness of breath) 03/22/2016  . Chronic cough 03/22/2016  . Abnormal bruising 10/12/2015  . EKG abnormalities 02/15/2012  . ADENOMATOUS COLONIC POLYP 04/16/2010  . REACTIVE AIRWAY DISEASE 04/16/2010  . Hyperlipidemia 03/12/2009    Current Outpatient Medications on File Prior to Visit  Medication Sig Dispense Refill  . Calcium Carb-Cholecalciferol (CALCIUM 600 + D PO) Take 1 tablet by mouth daily.    . Magnesium 400 MG CAPS Take 1 capsule by mouth daily.    Marland Kitchen MELATONIN PO Take 1 tablet by mouth at bedtime as needed (sleep).     . naproxen sodium (ALEVE) 220 MG tablet Take 220 mg by mouth daily as needed (pain).      No current facility-administered medications on file prior to visit.     Past Medical History:  Diagnosis Date  . Arthritis   . Dizziness   . Hyperlipidemia    denies  . Tubular adenoma of colon 03/30/09   Dr Sharlett Iles    Past Surgical  History:  Procedure Laterality Date  . APPENDECTOMY     with 1st pregnancy  . BRAVO [place] STUDY N/A 11/30/2017   Procedure: BRAVO [place];  Surgeon: Milus Banister, MD;  Location: [facility];  Service: Endoscopy;  Laterality: N/A;  . BREAST BIOPSY     needle aspiration X1; resection X 1  . colonoscopy with polypectomy  03/30/2009   Dr Sharlett Iles  . CT SCAN  2017   Chest due to a possible blood clot but came back normal  . ESOPHAGOGASTRODUODENOSCOPY (EGD) WITH PROPOFOL N/A 11/30/2017   Procedure: ESOPHAGOGASTRODUODENOSCOPY (EGD) WITH PROPOFOL;  Surgeon: Milus Banister, MD;  Location: [facility];  Service: Endoscopy;  Laterality: N/A;  48 hour Ph probe  . frozen shoulder Left    broke the cartilage-was put to sleep Dr. Joni Fears apox 2014  . G 3 P 3     3 C sections  . TUBAL LIGATION      Social History   Socioeconomic History  . Marital status: Married    Spouse name: Not on file  . Number of children: Not on file  . Years of education: Not on file  . Highest education level: Not on file  Occupational History  . Not on file  Social Needs  .  Financial resource strain: Not on file  . Food insecurity    Worry: Not on file    Inability: Not on file  . Transportation needs    Medical: Not on file    Non-medical: Not on file  Tobacco Use  . Smoking status: Never Smoker  . Smokeless tobacco: Never Used  Substance and Sexual Activity  . Alcohol use: No    Comment: rare  . Drug use: No  . Sexual activity: Yes    Partners: Male    Comment: 1st intercourse- 49, partners- 2, married- 25 yrs   Lifestyle  . Physical activity    Days per week: Not on file    Minutes per session: Not on file  . Stress: Not on file  Relationships  . Social Herbalist on phone: Not on file    Gets together: Not on file    Attends religious service: Not on file    Active member of club or organization: Not on file    Attends meetings of clubs or organizations: Not on file     Relationship status: Not on file  Other Topics Concern  . Not on file  Social History Narrative   Exercises regularly    Family History  Problem Relation Age of Onset  . Breast cancer Maternal Aunt   . Diabetes Maternal Aunt   . Stroke Maternal Aunt        ? . one M aunt;> 65  . Heart disease Neg Hx   . Colon cancer Neg Hx     Review of Systems  Constitutional: Negative for chills and fever.  Eyes: Negative for visual disturbance.  Respiratory: Positive for cough (chronic). Negative for shortness of breath and wheezing.   Cardiovascular: Negative for chest pain, palpitations and leg swelling.  Gastrointestinal: Negative for abdominal pain, blood in stool, constipation, diarrhea and nausea.       No gerd  Genitourinary: Negative for dysuria and hematuria.  Musculoskeletal: Negative for arthralgias and back pain.  Skin: Positive for rash.  Neurological: Negative for dizziness, light-headedness, numbness and headaches.  Psychiatric/Behavioral: Negative for dysphoric mood. The patient is not nervous/anxious.        Objective:   Vitals:   09/21/18 0752  BP: (!) 144/90  Pulse: (!) 59  Resp: 16  Temp: 98 F (36.7 C)  SpO2: 99%   Filed Weights   09/21/18 0752  Weight: 154 lb 1.9 oz (69.9 kg)   Body mass index is 24.88 kg/m.  BP Readings from Last 3 Encounters:  09/21/18 (!) 144/90  01/23/18 104/70  12/25/17 126/84    Wt Readings from Last 3 Encounters:  09/21/18 154 lb 1.9 oz (69.9 kg)  01/23/18 161 lb 8 oz (73.3 kg)  12/25/17 160 lb 6.4 oz (72.8 kg)     Physical Exam Constitutional: She appears well-developed and well-nourished. No distress.  HENT:  Head: Normocephalic and atraumatic.  Right Ear: External ear normal. Normal ear canal and TM Left Ear: External ear normal.  Normal ear canal and TM Mouth/Throat: Oropharynx is clear and moist.  Eyes: Conjunctivae and EOM are normal.  Neck: Neck supple. No tracheal deviation present. No thyromegaly present.   No carotid bruit  Cardiovascular: Normal rate, regular rhythm and normal heart sounds.   No murmur heard.  No edema. Pulmonary/Chest: Effort normal and breath sounds normal. No respiratory distress. She has no wheezes. She has no rales.  Breast: deferred   Abdominal: Soft. She exhibits no distension.  There is no tenderness.  Lymphadenopathy: She has no cervical adenopathy.  Skin: Skin is warm and dry. She is not diaphoretic.  Psychiatric: She has a normal mood and affect. Her behavior is normal.        Assessment & Plan:   Physical exam: Screening blood work ordered Immunizations discussed Shingrix, others up-to-date Colonoscopy   up-to-date Mammogram    up-to-date Gyn    up-to-date Eye exams   Up to date  Exercise   Active, regular Weight    Has lost weight, BMI is normal Skin    sees dermatology as needed Substance abuse   none  See Problem List for Assessment and Plan of chronic medical problems.   FU in one year

## 2018-09-20 NOTE — Patient Instructions (Addendum)
Tests ordered today. Your results will be released to [facility] (or called to you) after review.  If any changes need to be made, you will be notified at that same time.  All other Health Maintenance issues reviewed.   All recommended immunizations and age-appropriate screenings are up-to-date or discussed.  No immunization administered today.   Medications reviewed and updated.  Changes include :  none    Please followup in 12 months    Health Maintenance, Female Adopting a healthy lifestyle and getting preventive care are important in promoting health and wellness. Ask your health care provider about:  The right schedule for you to have regular tests and exams.  Things you can do on your own to prevent diseases and keep yourself healthy. What should I know about diet, weight, and exercise? Eat a healthy diet   Eat a diet that includes plenty of vegetables, fruits, low-fat dairy products, and lean protein.  Do not eat a lot of foods that are high in solid fats, added sugars, or sodium. Maintain a healthy weight Body mass index (BMI) is used to identify weight problems. It estimates body fat based on height and weight. Your health care provider can help determine your BMI and help you achieve or maintain a healthy weight. Get regular exercise Get regular exercise. This is one of the most important things you can do for your health. Most adults should:  Exercise for at least 150 minutes each week. The exercise should increase your heart rate and make you sweat (moderate-intensity exercise).  Do strengthening exercises at least twice a week. This is in addition to the moderate-intensity exercise.  Spend less time sitting. Even light physical activity can be beneficial. Watch cholesterol and blood lipids Have your blood tested for lipids and cholesterol at 60 years of age, then have this test every 5 years. Have your cholesterol levels checked more often if:  Your lipid or  cholesterol levels are high.  You are older than 59 years of age.  You are at high risk for heart disease. What should I know about cancer screening? Depending on your health history and family history, you may need to have cancer screening at various ages. This may include screening for:  Breast cancer.  Cervical cancer.  Colorectal cancer.  Skin cancer.  Lung cancer. What should I know about heart disease, diabetes, and high blood pressure? Blood pressure and heart disease  High blood pressure causes heart disease and increases the risk of stroke. This is more likely to develop in people who have high blood pressure readings, are of African descent, or are overweight.  Have your blood pressure checked: ? Every 3-5 years if you are 70-3 years of age. ? Every year if you are 60 years old or older. Diabetes Have regular diabetes screenings. This checks your fasting blood sugar level. Have the screening done:  Once every three years after age 3 if you are at a normal weight and have a low risk for diabetes.  More often and at a younger age if you are overweight or have a high risk for diabetes. What should I know about preventing infection? Hepatitis B If you have a higher risk for hepatitis B, you should be screened for this virus. Talk with your health care provider to find out if you are at risk for hepatitis B infection. Hepatitis C Testing is recommended for:  Everyone born from [birth year] through 1966.  Anyone with known risk factors for hepatitis C. Sexually  transmitted infections (STIs)  Get screened for STIs, including gonorrhea and chlamydia, if: ? You are sexually active and are younger than 59 years of age. ? You are older than 59 years of age and your health care provider tells you that you are at risk for this type of infection. ? Your sexual activity has changed since you were last screened, and you are at increased risk for chlamydia or gonorrhea. Ask your  health care provider if you are at risk.  Ask your health care provider about whether you are at high risk for HIV. Your health care provider may recommend a prescription medicine to help prevent HIV infection. If you choose to take medicine to prevent HIV, you should first get tested for HIV. You should then be tested every 3 months for as long as you are taking the medicine. Pregnancy  If you are about to stop having your period (premenopausal) and you may become pregnant, seek counseling before you get pregnant.  Take 400 to 800 micrograms (mcg) of folic acid every day if you become pregnant.  Ask for birth control (contraception) if you want to prevent pregnancy. Osteoporosis and menopause Osteoporosis is a disease in which the bones lose minerals and strength with aging. This can result in bone fractures. If you are 37 years old or older, or if you are at risk for osteoporosis and fractures, ask your health care provider if you should:  Be screened for bone loss.  Take a calcium or vitamin D supplement to lower your risk of fractures.  Be given hormone replacement therapy (HRT) to treat symptoms of menopause. Follow these instructions at home: Lifestyle  Do not use any products that contain nicotine or tobacco, such as cigarettes, e-cigarettes, and chewing tobacco. If you need help quitting, ask your health care provider.  Do not use street drugs.  Do not share needles.  Ask your health care provider for help if you need support or information about quitting drugs. Alcohol use  Do not drink alcohol if: ? Your health care provider tells you not to drink. ? You are pregnant, may be pregnant, or are planning to become pregnant.  If you drink alcohol: ? Limit how much you use to 0-1 drink a day. ? Limit intake if you are breastfeeding.  Be aware of how much alcohol is in your drink. In the [place] one drink equals one 12 oz bottle of beer (355 mL), one 5 oz glass of wine (148  mL), or one 1 oz glass of hard liquor (44 mL). General instructions  Schedule regular health, dental, and eye exams.  Stay current with your vaccines.  Tell your health care provider if: ? You often feel depressed. ? You have ever been abused or do not feel safe at home. Summary  Adopting a healthy lifestyle and getting preventive care are important in promoting health and wellness.  Follow your health care provider's instructions about healthy diet, exercising, and getting tested or screened for diseases.  Follow your health care provider's instructions on monitoring your cholesterol and blood pressure. This information is not intended to replace advice given to you by your health care provider. Make sure you discuss any questions you have with your health care provider. Document Released: 08/30/2010 Document Revised: 02/07/2018 Document Reviewed: 02/07/2018 Elsevier Patient Education  2020 [employer].

## 2018-09-21 ENCOUNTER — Encounter: Payer: Self-pay | Admitting: Internal Medicine

## 2018-09-21 ENCOUNTER — Other Ambulatory Visit (INDEPENDENT_AMBULATORY_CARE_PROVIDER_SITE_OTHER): Payer: BC Managed Care – PPO

## 2018-09-21 ENCOUNTER — Other Ambulatory Visit: Payer: Self-pay

## 2018-09-21 ENCOUNTER — Ambulatory Visit (INDEPENDENT_AMBULATORY_CARE_PROVIDER_SITE_OTHER): Payer: BC Managed Care – PPO | Admitting: Internal Medicine

## 2018-09-21 VITALS — BP 144/90 | HR 59 | Temp 98.0°F | Resp 16 | Ht 66.0 in | Wt 154.1 lb

## 2018-09-21 DIAGNOSIS — R05 Cough: Secondary | ICD-10-CM | POA: Diagnosis not present

## 2018-09-21 DIAGNOSIS — Z0000 Encounter for general adult medical examination without abnormal findings: Secondary | ICD-10-CM

## 2018-09-21 DIAGNOSIS — E782 Mixed hyperlipidemia: Secondary | ICD-10-CM | POA: Diagnosis not present

## 2018-09-21 DIAGNOSIS — R739 Hyperglycemia, unspecified: Secondary | ICD-10-CM

## 2018-09-21 DIAGNOSIS — R053 Chronic cough: Secondary | ICD-10-CM

## 2018-09-21 LAB — COMPREHENSIVE METABOLIC PANEL
ALT: 16 U/L (ref 0–35)
AST: 20 U/L (ref 0–37)
Albumin: 4.7 g/dL (ref 3.5–5.2)
Alkaline Phosphatase: 26 U/L — ABNORMAL LOW (ref 39–117)
BUN: 14 mg/dL (ref 6–23)
CO2: 28 mEq/L (ref 19–32)
Calcium: 9.9 mg/dL (ref 8.4–10.5)
Chloride: 102 mEq/L (ref 96–112)
Creatinine, Ser: 1.01 mg/dL (ref 0.40–1.20)
GFR: 56 mL/min — ABNORMAL LOW (ref >60.00)
Glucose, Bld: 102 mg/dL — ABNORMAL HIGH (ref 70–99)
Potassium: 3.9 mEq/L (ref 3.5–5.1)
Sodium: 139 mEq/L (ref 135–145)
Total Bilirubin: 0.6 mg/dL (ref 0.2–1.2)
Total Protein: 7.5 g/dL (ref 6.0–8.3)

## 2018-09-21 LAB — CBC WITH DIFFERENTIAL/PLATELET
Basophils Absolute: 0.1 10*3/uL (ref 0.0–0.1)
Basophils Relative: 1.1 % (ref 0.0–3.0)
Eosinophils Absolute: 0.1 10*3/uL (ref 0.0–0.7)
Eosinophils Relative: 1.6 % (ref 0.0–5.0)
HCT: 41.5 % (ref 36.0–46.0)
Hemoglobin: 14 g/dL (ref 12.0–15.0)
Lymphocytes Relative: 28.6 % (ref 12.0–46.0)
Lymphs Abs: 1.5 10*3/uL (ref 0.7–4.0)
MCHC: 33.7 g/dL (ref 30.0–36.0)
MCV: 90.8 fl (ref 78.0–100.0)
Monocytes Absolute: 0.4 10*3/uL (ref 0.1–1.0)
Monocytes Relative: 8 % (ref 3.0–12.0)
Neutro Abs: 3.1 10*3/uL (ref 1.4–7.7)
Neutrophils Relative %: 60.7 % (ref 43.0–77.0)
Platelets: 236 10*3/uL (ref 150.0–400.0)
RBC: 4.57 Mil/uL (ref 3.87–5.11)
RDW: 14 % (ref 11.5–15.5)
WBC: 5.1 10*3/uL (ref 4.0–10.5)

## 2018-09-21 LAB — LIPID PANEL
Cholesterol: 244 mg/dL — ABNORMAL HIGH (ref 0–200)
HDL: 65.9 mg/dL (ref >39.00)
LDL Cholesterol: 157 mg/dL — ABNORMAL HIGH (ref 0–99)
NonHDL: 178.25
Total CHOL/HDL Ratio: 4
Triglycerides: 108 mg/dL (ref 0.0–149.0)
VLDL: 21.6 mg/dL (ref 0.0–40.0)

## 2018-09-21 LAB — TSH: TSH: 5.78 u[IU]/mL — ABNORMAL HIGH (ref 0.35–4.50)

## 2018-09-21 LAB — HEMOGLOBIN A1C: Hgb A1c MFr Bld: 5.5 % (ref 4.6–6.5)

## 2018-09-21 MED ORDER — TURMERIC 500 MG PO CAPS: ORAL_CAPSULE | ORAL | Status: DC

## 2018-09-21 NOTE — Assessment & Plan Note (Addendum)
Chronic Cough is better It is shallow, in throat W/u in past showed no cause Not gerd She does have some postnasal drip, which contributes Sleeping with 2 pillows

## 2018-09-23 ENCOUNTER — Encounter: Payer: Self-pay | Admitting: Internal Medicine

## 2018-09-23 ENCOUNTER — Other Ambulatory Visit: Payer: Self-pay | Admitting: Internal Medicine

## 2018-09-23 DIAGNOSIS — R7989 Other specified abnormal findings of blood chemistry: Secondary | ICD-10-CM

## 2018-10-26 ENCOUNTER — Other Ambulatory Visit (INDEPENDENT_AMBULATORY_CARE_PROVIDER_SITE_OTHER): Payer: BC Managed Care – PPO

## 2018-10-26 DIAGNOSIS — R7989 Other specified abnormal findings of blood chemistry: Secondary | ICD-10-CM | POA: Diagnosis not present

## 2018-10-26 LAB — T3, FREE: T3, Free: 3 pg/mL (ref 2.3–4.2)

## 2018-10-26 LAB — TSH: TSH: 2.12 u[IU]/mL (ref 0.35–4.50)

## 2018-10-26 LAB — T4, FREE: Free T4: 0.9 ng/dL (ref 0.60–1.60)

## 2018-10-27 ENCOUNTER — Encounter: Payer: Self-pay | Admitting: Internal Medicine

## 2018-11-27 ENCOUNTER — Encounter: Payer: Self-pay | Admitting: Gynecology

## 2018-12-28 ENCOUNTER — Encounter: Payer: Self-pay | Admitting: Obstetrics & Gynecology

## 2018-12-28 ENCOUNTER — Ambulatory Visit (INDEPENDENT_AMBULATORY_CARE_PROVIDER_SITE_OTHER): Payer: BC Managed Care – PPO | Admitting: Obstetrics & Gynecology

## 2018-12-28 ENCOUNTER — Other Ambulatory Visit: Payer: Self-pay

## 2018-12-28 VITALS — BP 132/82 | Ht 66.0 in | Wt 158.0 lb

## 2018-12-28 DIAGNOSIS — Z01419 Encounter for gynecological examination (general) (routine) without abnormal findings: Secondary | ICD-10-CM | POA: Diagnosis not present

## 2018-12-28 DIAGNOSIS — Z780 Asymptomatic menopausal state: Secondary | ICD-10-CM

## 2018-12-28 DIAGNOSIS — M6208 Separation of muscle (nontraumatic), other site: Secondary | ICD-10-CM

## 2018-12-28 NOTE — Patient Instructions (Signed)
1. Well female exam with routine gynecological exam Normal gynecologic exam.  Last Pap test October 2018 was negative with negative high-risk HPV, no indication to repeat a Pap test this year.  Breast exam normal.  Screening mammogram December 2019 was negative.  Colonoscopy October 2019.  Health labs with family physician.  Good body mass index at 25.5.  Continue with fitness and healthy nutrition.  2. Postmenopausal Well on no hormone replacement therapy.  No postmenopausal bleeding.  Bone density was normal in November 2018.  Will repeat at 5 years.  Vitamin D supplements, calcium intake of 1200 mg daily and regular weightbearing physical activity is recommended.  3. Diastasis of rectus abdominis Small diastases of the recti muscles above the umbilicus with minimal bulging with increased abdominal pressure.  No herniation at this time.  Recommendation to protect that area from excessive pressure when exercising.  Other orders - cetirizine (ZYRTEC) 10 MG tablet; Take 10 mg by mouth daily.  Jean Pittman, it was a pleasure seeing you today!

## 2018-12-28 NOTE — Progress Notes (Signed)
Jean Pittman 12-12-1960 [id number]   History:    58 y.o. G3P3L3 Married  RP:  Established patient presenting for annual gyn exam   HPI: Postmenopause, well on no HRT.  No PMB.  No pelvic pain.  Small bulge in midline above the umbilicus with abdominal pressure, no pain, retractable.  Abstinent, husband with ED.  Breasts normal.  BMI 25.5.  Walking regularly.  Health labs with Fam MD 08/2018.  Past medical history,surgical history, family history and social history were all reviewed and documented in the EPIC chart.  Gynecologic History Patient's last menstrual period was 07/01/2013. Contraception: abstinence and post menopausal status Last Pap: 11/2016. Results were: Negative, HPV HR neg Last mammogram: 01/2018 Results were: Negative Bone Density: 12/2016 Normal Colonoscopy: 11/2017  Obstetric History OB History  Gravida Para Term Preterm AB Living  3 3     0 3  SAB TAB Ectopic Multiple Live Births      0        # Outcome Date GA Lbr Len/2nd Weight Sex Delivery Anes PTL Lv  3 Para           2 Para           1 Para              ROS: A ROS was performed and pertinent positives and negatives are included in the history.  GENERAL: No fevers or chills. HEENT: No change in vision, no earache, sore throat or sinus congestion. NECK: No pain or stiffness. CARDIOVASCULAR: No chest pain or pressure. No palpitations. PULMONARY: No shortness of breath, cough or wheeze. GASTROINTESTINAL: No abdominal pain, nausea, vomiting or diarrhea, melena or bright red blood per rectum. GENITOURINARY: No urinary frequency, urgency, hesitancy or dysuria. MUSCULOSKELETAL: No joint or muscle pain, no back pain, no recent trauma. DERMATOLOGIC: No rash, no itching, no lesions. ENDOCRINE: No polyuria, polydipsia, no heat or cold intolerance. No recent change in weight. HEMATOLOGICAL: No anemia or easy bruising or bleeding. NEUROLOGIC: No headache, seizures, numbness, tingling or weakness. PSYCHIATRIC: No  depression, no loss of interest in normal activity or change in sleep pattern.     Exam:   BP 132/82 (BP Location: Right Arm, Patient Position: Sitting, Cuff Size: Normal)   Ht 5\' 6"  (1.676 m)   Wt 158 lb (71.7 kg)   LMP 07/01/2013   BMI 25.50 kg/m   Body mass index is 25.5 kg/m.  General appearance : Well developed well nourished female. No acute distress HEENT: Eyes: no retinal hemorrhage or exudates,  Neck supple, trachea midline, no carotid bruits, no thyroidmegaly Lungs: Clear to auscultation, no rhonchi or wheezes, or rib retractions  Heart: Regular rate and rhythm, no murmurs or gallops Breast:Examined in sitting and supine position were symmetrical in appearance, no palpable masses or tenderness,  no skin retraction, no nipple inversion, no nipple discharge, no skin discoloration, no axillary or supraclavicular lymphadenopathy Abdomen: no palpable masses or tenderness, no rebound or guarding Extremities: no edema or skin discoloration or tenderness  Pelvic: Vulva: Normal             Vagina: No gross lesions or discharge  Cervix: No gross lesions or discharge  Uterus  AV, normal size, shape and consistency, non-tender and mobile  Adnexa  Without masses or tenderness  Anus: Normal   Assessment/Plan:  58 y.o. female for annual exam   1. Well female exam with routine gynecological exam Normal gynecologic exam.  Last Pap test October 2018 was negative with  negative high-risk HPV, no indication to repeat a Pap test this year.  Breast exam normal.  Screening mammogram December 2019 was negative.  Colonoscopy October 2019.  Health labs with family physician.  Good body mass index at 25.5.  Continue with fitness and healthy nutrition.  2. Postmenopausal Well on no hormone replacement therapy.  No postmenopausal bleeding.  Bone density was normal in November 2018.  Will repeat at 5 years.  Vitamin D supplements, calcium intake of 1200 mg daily and regular weightbearing physical  activity is recommended.  3. Diastasis of rectus abdominis Small diastases of the recti muscles above the umbilicus with minimal bulging with increased abdominal pressure.  No herniation at this time.  Recommendation to protect that area from excessive pressure when exercising.  Other orders - cetirizine (ZYRTEC) 10 MG tablet; Take 10 mg by mouth daily.  Princess Bruins MD, 10:24 AM 12/28/2018

## 2019-01-09 ENCOUNTER — Other Ambulatory Visit: Payer: Self-pay | Admitting: Obstetrics & Gynecology

## 2019-01-09 DIAGNOSIS — Z1231 Encounter for screening mammogram for malignant neoplasm of breast: Secondary | ICD-10-CM

## 2019-03-05 ENCOUNTER — Ambulatory Visit: Payer: BLUE CROSS/BLUE SHIELD

## 2019-03-05 ENCOUNTER — Other Ambulatory Visit: Payer: Self-pay

## 2019-06-07 ENCOUNTER — Other Ambulatory Visit: Payer: Self-pay

## 2019-06-07 ENCOUNTER — Ambulatory Visit
Admission: RE | Admit: 2019-06-07 | Discharge: 2019-06-07 | Disposition: A | Discharge status: Home / Self Care | Payer: BLUE CROSS/BLUE SHIELD | Source: Ambulatory Visit | Attending: Obstetrics & Gynecology | Admitting: Obstetrics & Gynecology

## 2019-06-07 DIAGNOSIS — Z1231 Encounter for screening mammogram for malignant neoplasm of breast: Secondary | ICD-10-CM

## 2019-08-09 ENCOUNTER — Telehealth: Payer: Self-pay

## 2019-08-09 DIAGNOSIS — Z0000 Encounter for general adult medical examination without abnormal findings: Secondary | ICD-10-CM

## 2019-08-09 DIAGNOSIS — E782 Mixed hyperlipidemia: Secondary | ICD-10-CM

## 2019-08-09 DIAGNOSIS — R739 Hyperglycemia, unspecified: Secondary | ICD-10-CM

## 2019-08-09 NOTE — Telephone Encounter (Signed)
New message    The patient is asking for a lab order to be entered into the epic system prior before appt in August.

## 2019-08-09 NOTE — Telephone Encounter (Signed)
Labs ordered.

## 2019-08-12 NOTE — Telephone Encounter (Signed)
Pt informed labs have been ordered and to go to the [place] lab at her convenience.

## 2019-08-26 ENCOUNTER — Other Ambulatory Visit (INDEPENDENT_AMBULATORY_CARE_PROVIDER_SITE_OTHER): Payer: 59

## 2019-08-26 DIAGNOSIS — R739 Hyperglycemia, unspecified: Secondary | ICD-10-CM

## 2019-08-26 DIAGNOSIS — Z0000 Encounter for general adult medical examination without abnormal findings: Secondary | ICD-10-CM | POA: Diagnosis not present

## 2019-08-26 DIAGNOSIS — E782 Mixed hyperlipidemia: Secondary | ICD-10-CM

## 2019-08-26 LAB — CBC WITH DIFFERENTIAL/PLATELET
Basophils Absolute: 0.1 10*3/uL (ref 0.0–0.1)
Basophils Relative: 1.2 % (ref 0.0–3.0)
Eosinophils Absolute: 0.1 10*3/uL (ref 0.0–0.7)
Eosinophils Relative: 3.2 % (ref 0.0–5.0)
HCT: 40 % (ref 36.0–46.0)
Hemoglobin: 13.7 g/dL (ref 12.0–15.0)
Lymphocytes Relative: 29.6 % (ref 12.0–46.0)
Lymphs Abs: 1.3 10*3/uL (ref 0.7–4.0)
MCHC: 34.2 g/dL (ref 30.0–36.0)
MCV: 89 fl (ref 78.0–100.0)
Monocytes Absolute: 0.3 10*3/uL (ref 0.1–1.0)
Monocytes Relative: 7.3 % (ref 3.0–12.0)
Neutro Abs: 2.6 10*3/uL (ref 1.4–7.7)
Neutrophils Relative %: 58.7 % (ref 43.0–77.0)
Platelets: 205 10*3/uL (ref 150.0–400.0)
RBC: 4.49 Mil/uL (ref 3.87–5.11)
RDW: 14 % (ref 11.5–15.5)
WBC: 4.4 10*3/uL (ref 4.0–10.5)

## 2019-08-26 LAB — COMPREHENSIVE METABOLIC PANEL
ALT: 12 U/L (ref 0–35)
AST: 16 U/L (ref 0–37)
Albumin: 4.5 g/dL (ref 3.5–5.2)
Alkaline Phosphatase: 23 U/L — ABNORMAL LOW (ref 39–117)
BUN: 14 mg/dL (ref 6–23)
CO2: 27 mEq/L (ref 19–32)
Calcium: 9.6 mg/dL (ref 8.4–10.5)
Chloride: 103 mEq/L (ref 96–112)
Creatinine, Ser: 0.9 mg/dL (ref 0.40–1.20)
GFR: 63.77 mL/min (ref >60.00)
Glucose, Bld: 97 mg/dL (ref 70–99)
Potassium: 4.2 mEq/L (ref 3.5–5.1)
Sodium: 139 mEq/L (ref 135–145)
Total Bilirubin: 0.5 mg/dL (ref 0.2–1.2)
Total Protein: 7.1 g/dL (ref 6.0–8.3)

## 2019-08-26 LAB — LIPID PANEL
Cholesterol: 247 mg/dL — ABNORMAL HIGH (ref 0–200)
HDL: 55.7 mg/dL (ref >39.00)
LDL Cholesterol: 164 mg/dL — ABNORMAL HIGH (ref 0–99)
NonHDL: 190.96
Total CHOL/HDL Ratio: 4
Triglycerides: 135 mg/dL (ref 0.0–149.0)
VLDL: 27 mg/dL (ref 0.0–40.0)

## 2019-08-26 LAB — TSH: TSH: 4.02 u[IU]/mL (ref 0.35–4.50)

## 2019-08-26 LAB — HEMOGLOBIN A1C: Hgb A1c MFr Bld: 5.5 % (ref 4.6–6.5)

## 2019-08-27 ENCOUNTER — Encounter: Payer: Self-pay | Admitting: Internal Medicine

## 2019-10-01 ENCOUNTER — Encounter: Payer: 59 | Admitting: Internal Medicine

## 2019-10-20 NOTE — Patient Instructions (Addendum)
Blood work was ordered.    All other Health Maintenance issues reviewed.   All recommended immunizations and age-appropriate screenings are up-to-date or discussed.  Shingles #1 and tetanus immunizations administered today.   Medications reviewed and updated.  Changes include :   none  Your prescription(s) have been submitted to your pharmacy. Please take as directed and contact our office if you believe you are having problem(s) with the medication(s).    Please followup in 1 year   Health Maintenance, Female Adopting a healthy lifestyle and getting preventive care are important in promoting health and wellness. Ask your health care provider about:  The right schedule for you to have regular tests and exams.  Things you can do on your own to prevent diseases and keep yourself healthy. What should I know about diet, weight, and exercise? Eat a healthy diet   Eat a diet that includes plenty of vegetables, fruits, low-fat dairy products, and lean protein.  Do not eat a lot of foods that are high in solid fats, added sugars, or sodium. Maintain a healthy weight Body mass index (BMI) is used to identify weight problems. It estimates body fat based on height and weight. Your health care provider can help determine your BMI and help you achieve or maintain a healthy weight. Get regular exercise Get regular exercise. This is one of the most important things you can do for your health. Most adults should:  Exercise for at least 150 minutes each week. The exercise should increase your heart rate and make you sweat (moderate-intensity exercise).  Do strengthening exercises at least twice a week. This is in addition to the moderate-intensity exercise.  Spend less time sitting. Even light physical activity can be beneficial. Watch cholesterol and blood lipids Have your blood tested for lipids and cholesterol at 60 years of age, then have this test every 5 years. Have your cholesterol  levels checked more often if:  Your lipid or cholesterol levels are high.  You are older than 60 years of age.  You are at high risk for heart disease. What should I know about cancer screening? Depending on your health history and family history, you may need to have cancer screening at various ages. This may include screening for:  Breast cancer.  Cervical cancer.  Colorectal cancer.  Skin cancer.  Lung cancer. What should I know about heart disease, diabetes, and high blood pressure? Blood pressure and heart disease  High blood pressure causes heart disease and increases the risk of stroke. This is more likely to develop in people who have high blood pressure readings, are of African descent, or are overweight.  Have your blood pressure checked: ? Every 3-5 years if you are 6-82 years of age. ? Every year if you are 76 years old or older. Diabetes Have regular diabetes screenings. This checks your fasting blood sugar level. Have the screening done:  Once every three years after age 84 if you are at a normal weight and have a low risk for diabetes.  More often and at a younger age if you are overweight or have a high risk for diabetes. What should I know about preventing infection? Hepatitis B If you have a higher risk for hepatitis B, you should be screened for this virus. Talk with your health care provider to find out if you are at risk for hepatitis B infection. Hepatitis C Testing is recommended for:  Everyone born from [birth year] through 1966.  Anyone with known risk factors  for hepatitis C. Sexually transmitted infections (STIs)  Get screened for STIs, including gonorrhea and chlamydia, if: ? You are sexually active and are younger than 60 years of age. ? You are older than 60 years of age and your health care provider tells you that you are at risk for this type of infection. ? Your sexual activity has changed since you were last screened, and you are at increased  risk for chlamydia or gonorrhea. Ask your health care provider if you are at risk.  Ask your health care provider about whether you are at high risk for HIV. Your health care provider may recommend a prescription medicine to help prevent HIV infection. If you choose to take medicine to prevent HIV, you should first get tested for HIV. You should then be tested every 3 months for as long as you are taking the medicine. Pregnancy  If you are about to stop having your period (premenopausal) and you may become pregnant, seek counseling before you get pregnant.  Take 400 to 800 micrograms (mcg) of folic acid every day if you become pregnant.  Ask for birth control (contraception) if you want to prevent pregnancy. Osteoporosis and menopause Osteoporosis is a disease in which the bones lose minerals and strength with aging. This can result in bone fractures. If you are 47 years old or older, or if you are at risk for osteoporosis and fractures, ask your health care provider if you should:  Be screened for bone loss.  Take a calcium or vitamin D supplement to lower your risk of fractures.  Be given hormone replacement therapy (HRT) to treat symptoms of menopause. Follow these instructions at home: Lifestyle  Do not use any products that contain nicotine or tobacco, such as cigarettes, e-cigarettes, and chewing tobacco. If you need help quitting, ask your health care provider.  Do not use street drugs.  Do not share needles.  Ask your health care provider for help if you need support or information about quitting drugs. Alcohol use  Do not drink alcohol if: ? Your health care provider tells you not to drink. ? You are pregnant, may be pregnant, or are planning to become pregnant.  If you drink alcohol: ? Limit how much you use to 0-1 drink a day. ? Limit intake if you are breastfeeding.  Be aware of how much alcohol is in your drink. In the [place] one drink equals one 12 oz bottle of beer  (355 mL), one 5 oz glass of wine (148 mL), or one 1 oz glass of hard liquor (44 mL). General instructions  Schedule regular health, dental, and eye exams.  Stay current with your vaccines.  Tell your health care provider if: ? You often feel depressed. ? You have ever been abused or do not feel safe at home. Summary  Adopting a healthy lifestyle and getting preventive care are important in promoting health and wellness.  Follow your health care provider's instructions about healthy diet, exercising, and getting tested or screened for diseases.  Follow your health care provider's instructions on monitoring your cholesterol and blood pressure. This information is not intended to replace advice given to you by your health care provider. Make sure you discuss any questions you have with your health care provider. Document Revised: 02/07/2018 Document Reviewed: 02/07/2018 Elsevier Patient Education  2020 [employer].

## 2019-10-20 NOTE — Progress Notes (Signed)
Subjective:    Patient ID: Jean Pittman, female    DOB: [birth year], 59 y.o.   MRN: [id number]  HPI She is here for a physical exam.   Overall she feels well and has no concerns.  She does want to go over her health maintenance needed.  Medications and allergies reviewed with patient and updated if appropriate.  Patient Active Problem List   Diagnosis Date Noted  . Hyperglycemia 09/20/2018  . Upper airway cough syndrome 01/10/2017  . Chronic cough 03/22/2016  . EKG abnormalities 02/15/2012  . ADENOMATOUS COLONIC POLYP 04/16/2010  . REACTIVE AIRWAY DISEASE 04/16/2010  . Hyperlipidemia 03/12/2009    Current Outpatient Medications on File Prior to Visit  Medication Sig Dispense Refill  . acetaminophen (TYLENOL) 325 MG tablet Take 650 mg by mouth every 6 (six) hours as needed.    Marland Kitchen MELATONIN PO Take 1 tablet by mouth at bedtime as needed (sleep).     . naproxen sodium (ALEVE) 220 MG tablet Take 220 mg by mouth daily as needed (pain).      No current facility-administered medications on file prior to visit.    Past Medical History:  Diagnosis Date  . Arthritis   . Dizziness   . Hyperlipidemia    denies  . Tubular adenoma of colon 03/30/09   Dr Sharlett Iles    Past Surgical History:  Procedure Laterality Date  . APPENDECTOMY     with 1st pregnancy  . BRAVO [place] STUDY N/A 11/30/2017   Procedure: BRAVO [place];  Surgeon: Milus Banister, MD;  Location: [facility];  Service: Endoscopy;  Laterality: N/A;  . BREAST BIOPSY     needle aspiration X1; resection X 1  . colonoscopy with polypectomy  03/30/2009   Dr Sharlett Iles  . CT SCAN  2017   Chest due to a possible blood clot but came back normal  . ESOPHAGOGASTRODUODENOSCOPY (EGD) WITH PROPOFOL N/A 11/30/2017   Procedure: ESOPHAGOGASTRODUODENOSCOPY (EGD) WITH PROPOFOL;  Surgeon: Milus Banister, MD;  Location: [facility];  Service: Endoscopy;  Laterality: N/A;  48 hour Ph probe  . frozen shoulder Left    broke the  cartilage-was put to sleep Dr. Joni Fears apox 2014  . G 3 P 3     3 C sections  . TUBAL LIGATION      Social History   Socioeconomic History  . Marital status: Married    Spouse name: Not on file  . Number of children: Not on file  . Years of education: Not on file  . Highest education level: Not on file  Occupational History  . Not on file  Tobacco Use  . Smoking status: Never Smoker  . Smokeless tobacco: Never Used  Vaping Use  . Vaping Use: Never used  Substance and Sexual Activity  . Alcohol use: No    Comment: rare  . Drug use: No  . Sexual activity: Not Currently    Partners: Male    Comment: 1st intercourse- 32, partners- 2, married- 33 yrs   Other Topics Concern  . Not on file  Social History Narrative   Exercises regularly   Social Determinants of Health   Financial Resource Strain:   . Difficulty of Paying Living Expenses: Not on file  Food Insecurity:   . Worried About Charity fundraiser in the Last Year: Not on file  . Ran Out of Food in the Last Year: Not on file  Transportation Needs:   . Lack of Transportation (  Medical): Not on file  . Lack of Transportation (Non-Medical): Not on file  Physical Activity:   . Days of Exercise per Week: Not on file  . Minutes of Exercise per Session: Not on file  Stress:   . Feeling of Stress : Not on file  Social Connections:   . Frequency of Communication with Friends and Family: Not on file  . Frequency of Social Gatherings with Friends and Family: Not on file  . Attends Religious Services: Not on file  . Active Member of Clubs or Organizations: Not on file  . Attends Archivist Meetings: Not on file  . Marital Status: Not on file    Family History  Problem Relation Age of Onset  . Breast cancer Maternal Aunt   . Diabetes Maternal Aunt   . Stroke Maternal Aunt        ? . one M aunt;> 65  . Heart disease Father   . Hyperlipidemia Father   . Colon cancer Neg Hx     Review of Systems    Constitutional: Negative for chills and fever.  Eyes: Negative for visual disturbance.  Respiratory: Positive for cough. Negative for shortness of breath and wheezing.   Cardiovascular: Negative for chest pain, palpitations and leg swelling.  Gastrointestinal: Positive for constipation. Negative for abdominal pain, blood in stool, diarrhea and nausea.       No gerd  Genitourinary: Negative for dysuria and hematuria.  Musculoskeletal: Positive for arthralgias (mild). Negative for back pain.  Skin: Negative for rash.  Neurological: Negative for dizziness, light-headedness and headaches.  Psychiatric/Behavioral: Negative for dysphoric mood. The patient is not nervous/anxious.        Objective:   Vitals:   10/21/19 1513  BP: (!) 142/82  Pulse: 66  Temp: 98.3 F (36.8 C)  SpO2: 99%   Filed Weights   10/21/19 1513  Weight: 153 lb (69.4 kg)   Body mass index is 24.69 kg/m.  BP Readings from Last 3 Encounters:  10/21/19 (!) 142/82  12/28/18 132/82  09/21/18 (!) 144/90    Wt Readings from Last 3 Encounters:  10/21/19 153 lb (69.4 kg)  12/28/18 158 lb (71.7 kg)  09/21/18 154 lb 1.9 oz (69.9 kg)     Physical Exam Constitutional: She appears well-developed and well-nourished. No distress.  HENT:  Head: Normocephalic and atraumatic.  Right Ear: External ear normal. Normal ear canal and TM Left Ear: External ear normal.  Normal ear canal and TM Mouth/Throat: Oropharynx is clear and moist.  Eyes: Conjunctivae and EOM are normal.  Neck: Neck supple. No tracheal deviation present. No thyromegaly present.  No carotid bruit  Cardiovascular: Normal rate, regular rhythm and normal heart sounds.   No murmur heard.  No edema. Pulmonary/Chest: Effort normal and breath sounds normal. No respiratory distress. She has no wheezes. She has no rales.  Breast: deferred   Abdominal: Soft. She exhibits no distension. There is no tenderness.  Lymphadenopathy: She has no cervical adenopathy.   Skin: Skin is warm and dry. She is not diaphoretic.  Psychiatric: She has a normal mood and affect. Her behavior is normal.   The 10-year ASCVD risk score Mikey Bussing DC Jr., et al., 2013) is: 4.7%   Values used to calculate the score:     Age: 41 years     Sex: Female     Is Non-Hispanic African American: No     Diabetic: No     Tobacco smoker: No     Systolic Blood  Pressure: 142 mmHg     Is BP treated: No     HDL Cholesterol: 55.7 mg/dL     Total Cholesterol: 247 mg/dL      Assessment & Plan:   Physical exam: Screening blood work    reviewed Immunizations  Discussed covid.  Discussed td, flu, shingrix-we will give tetanus and shingles #1 vaccine today Colonoscopy  Up to date  Mammogram  Up to date  Gyn      Up to date  - goes in oct- nov Eye exams  Up to date  Exercise  Very active, walking/weights 2/week x 1 hr Weight  Normal  Substance abuse  none  See Problem List for Assessment and Plan of chronic medical problems.   This visit occurred during the SARS-CoV-2 public health emergency.  Safety protocols were in place, including screening questions prior to the visit, additional usage of staff PPE, and extensive cleaning of exam room while observing appropriate contact time as indicated for disinfecting solutions.

## 2019-10-21 ENCOUNTER — Other Ambulatory Visit: Payer: Self-pay

## 2019-10-21 ENCOUNTER — Encounter: Payer: Self-pay | Admitting: Internal Medicine

## 2019-10-21 ENCOUNTER — Ambulatory Visit (INDEPENDENT_AMBULATORY_CARE_PROVIDER_SITE_OTHER): Payer: 59 | Admitting: Internal Medicine

## 2019-10-21 VITALS — BP 142/82 | HR 66 | Temp 98.3°F | Ht 66.0 in | Wt 153.0 lb

## 2019-10-21 DIAGNOSIS — R053 Chronic cough: Secondary | ICD-10-CM

## 2019-10-21 DIAGNOSIS — R05 Cough: Secondary | ICD-10-CM

## 2019-10-21 DIAGNOSIS — Z23 Encounter for immunization: Secondary | ICD-10-CM

## 2019-10-21 DIAGNOSIS — E782 Mixed hyperlipidemia: Secondary | ICD-10-CM

## 2019-10-21 DIAGNOSIS — Z0000 Encounter for general adult medical examination without abnormal findings: Secondary | ICD-10-CM | POA: Diagnosis not present

## 2019-10-21 NOTE — Assessment & Plan Note (Signed)
Chronic-stable Likely genetic She is exercising regularly and eats healthy ASCVD risk is low Continue to work on lifestyle

## 2019-10-21 NOTE — Assessment & Plan Note (Signed)
Chronic Occurs with drainage or if she chokes while eating Improved overall

## 2019-11-18 IMAGING — MG DIGITAL SCREENING BILATERAL MAMMOGRAM WITH TOMO AND CAD
6 of 12 series · 6 of 36 positions shown · non-contrast
Comparison: Previous exam(s).

CLINICAL DATA: Screening.

EXAM:
DIGITAL SCREENING BILATERAL MAMMOGRAM WITH TOMO AND CAD

[L CC synth-2D (1 of 2)]
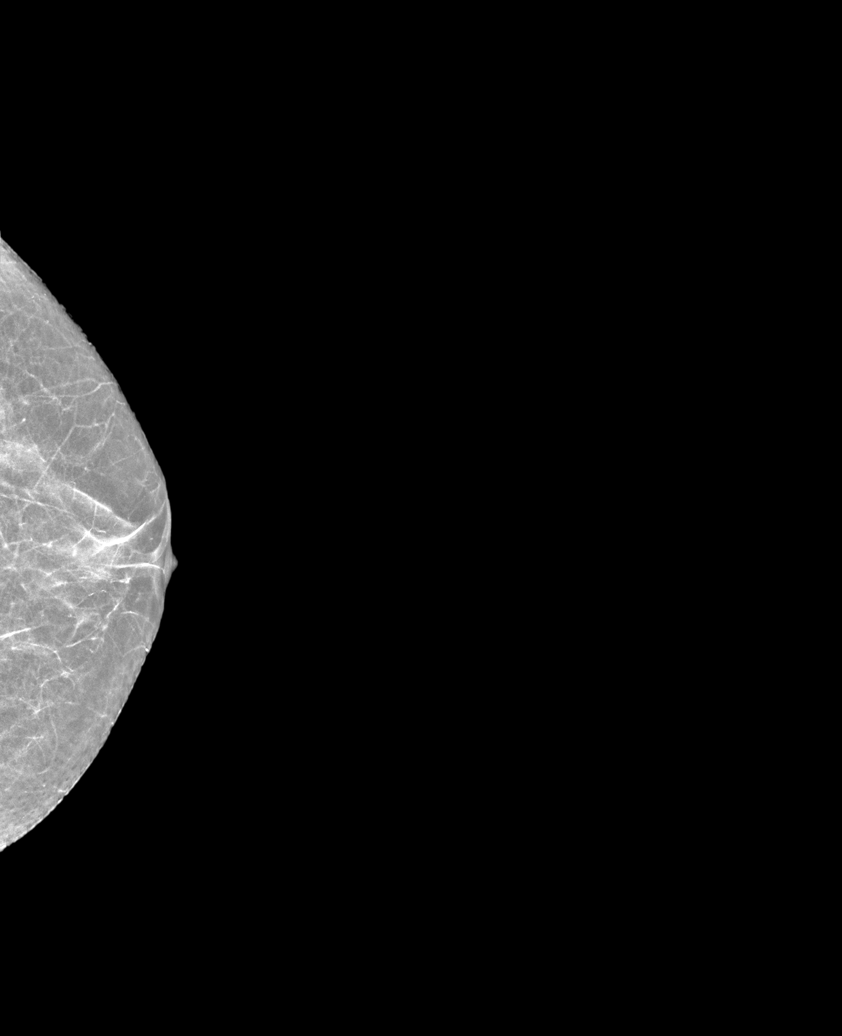

[L CC synth-2D (2 of 2)]
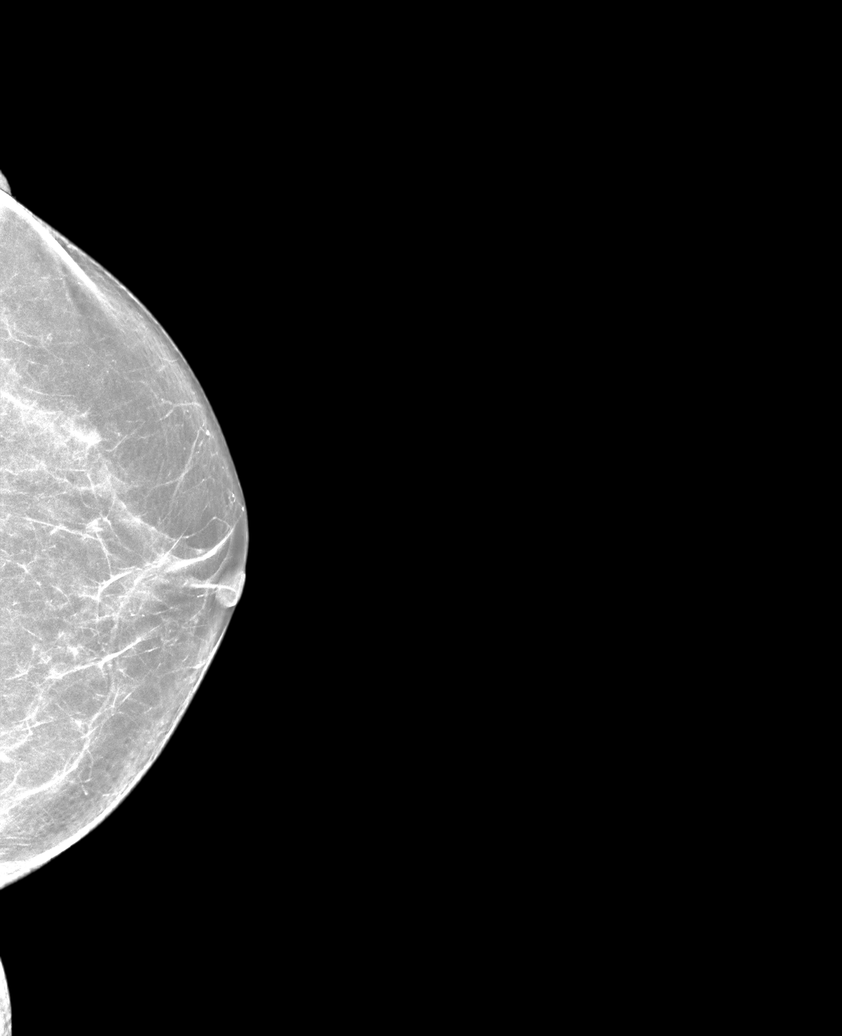

[L MLO synth-2D]
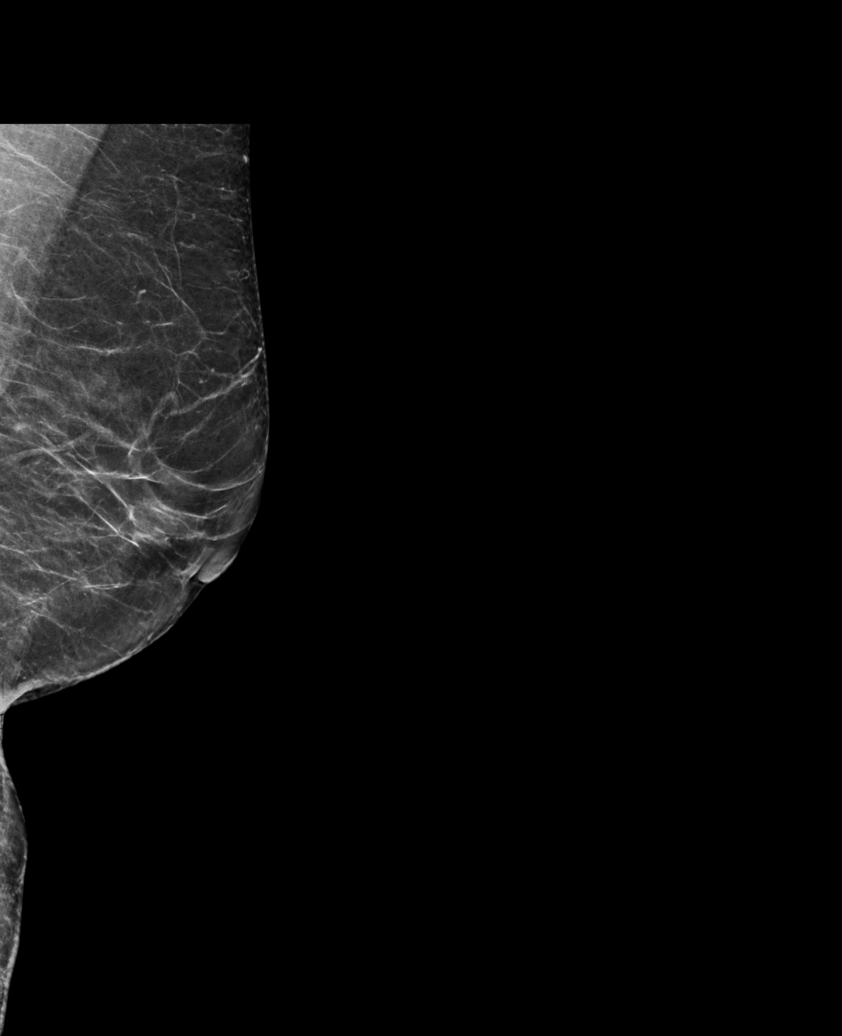

[R MLO synth-2D]
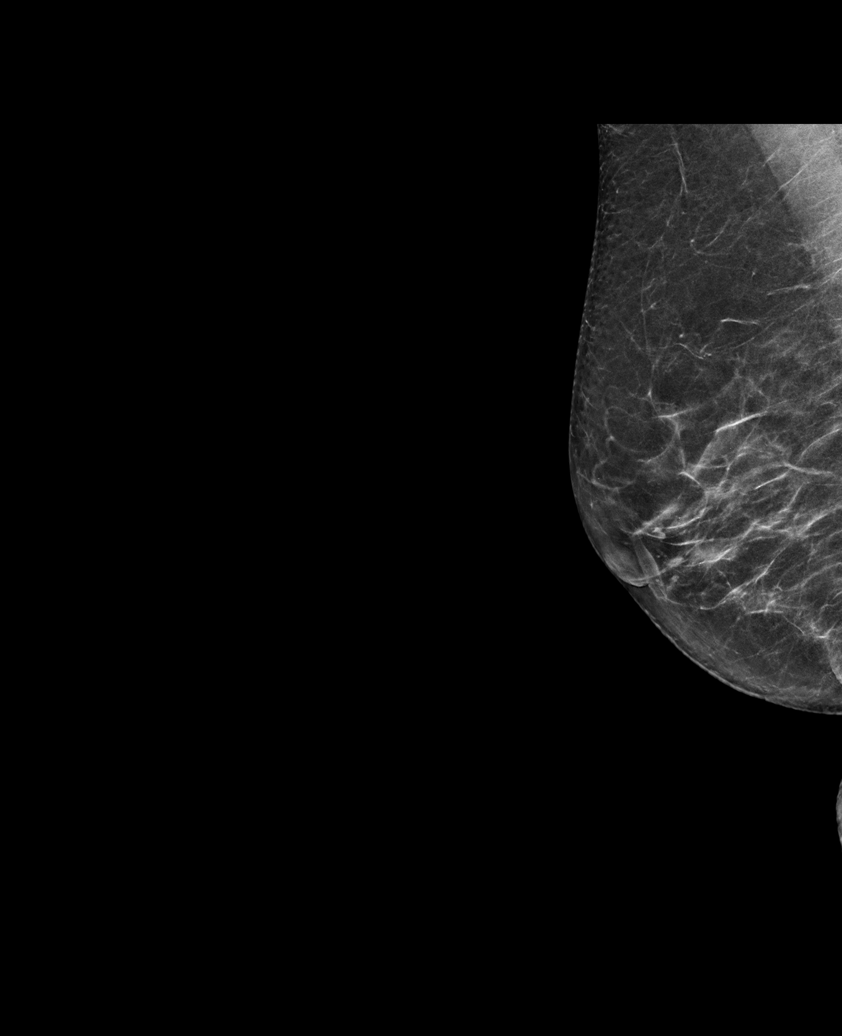

[R CC synth-2D (1 of 2)]
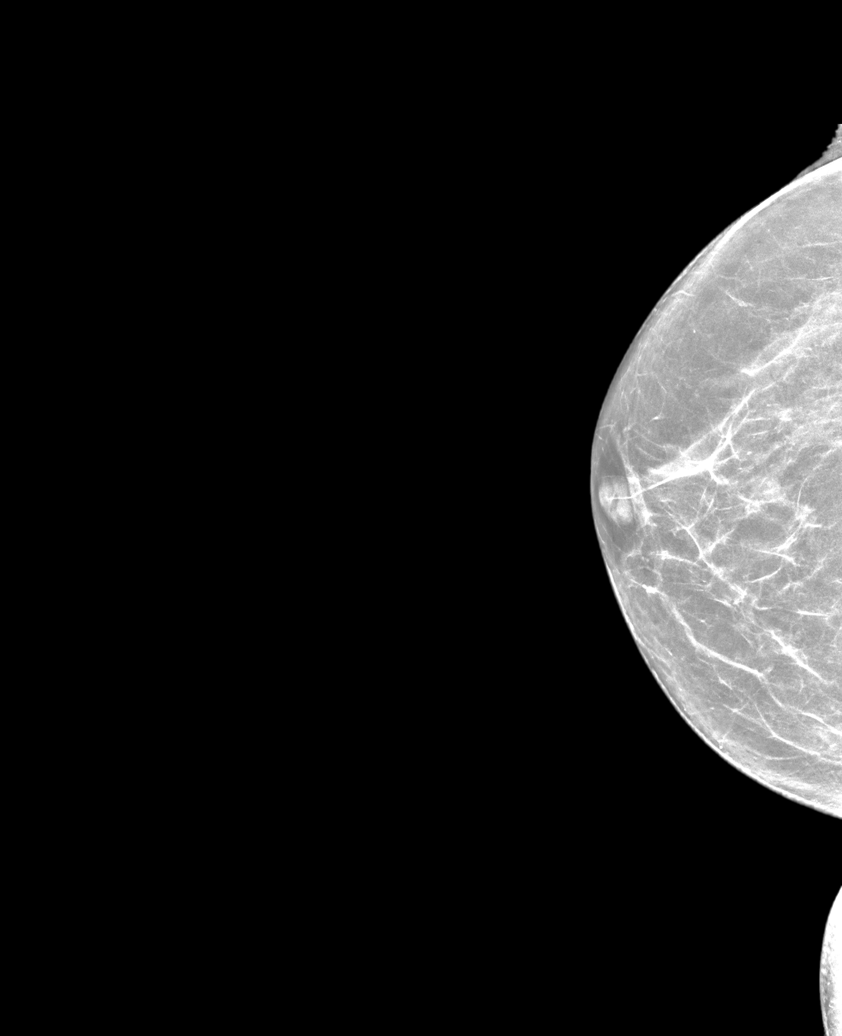

[R CC synth-2D (2 of 2)]
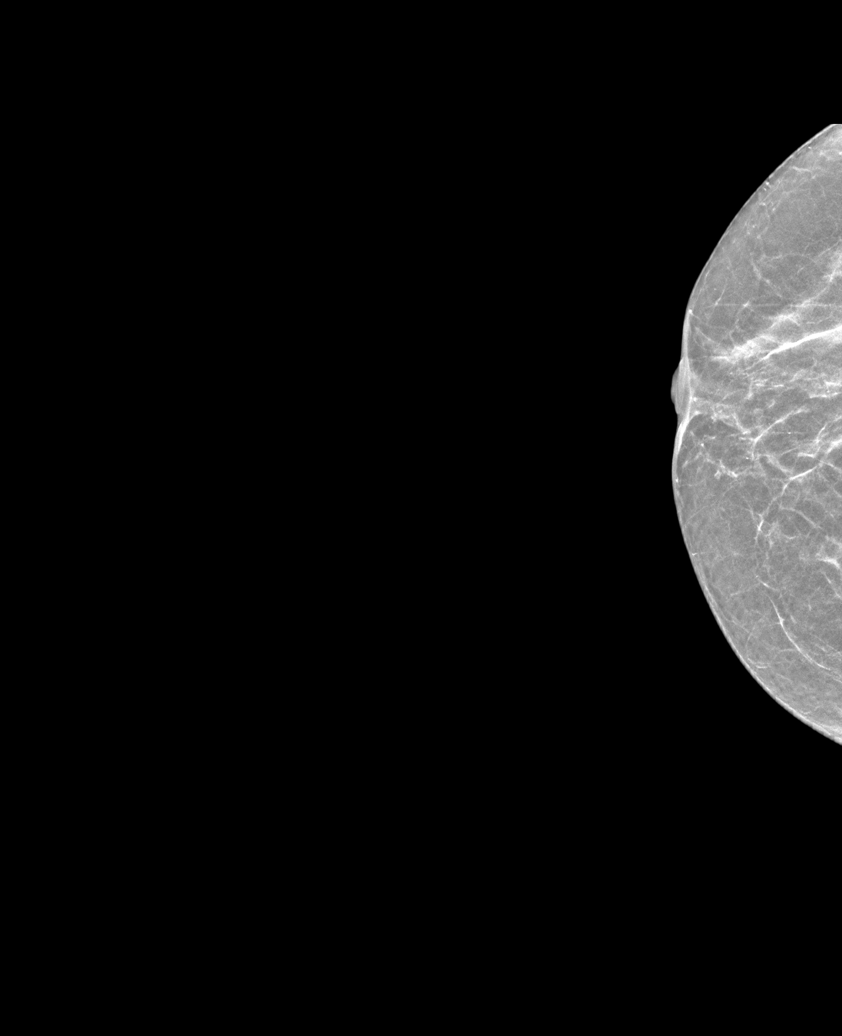

[6 of 36 positions shown; findings below may reference images not displayed]

ACR Breast Density Category b: There are scattered areas of
fibroglandular density.
FINDINGS: There are no findings suspicious for malignancy. Images were
processed with CAD.
IMPRESSION: No mammographic evidence of malignancy. A result letter of this
screening mammogram will be mailed directly to the patient.

RECOMMENDATION:
Screening mammogram in one year. (Code:[id number])

BI-RADS CATEGORY  1: Negative.

## 2019-12-02 ENCOUNTER — Encounter: Payer: Self-pay | Admitting: Internal Medicine

## 2019-12-30 ENCOUNTER — Encounter: Payer: BC Managed Care – PPO | Admitting: Obstetrics & Gynecology

## 2020-01-13 ENCOUNTER — Other Ambulatory Visit: Payer: Self-pay

## 2020-01-13 ENCOUNTER — Encounter: Payer: Self-pay | Admitting: Obstetrics & Gynecology

## 2020-01-13 ENCOUNTER — Ambulatory Visit (INDEPENDENT_AMBULATORY_CARE_PROVIDER_SITE_OTHER): Payer: 59 | Admitting: Obstetrics & Gynecology

## 2020-01-13 VITALS — BP 150/90 | Ht 66.0 in | Wt 153.0 lb

## 2020-01-13 DIAGNOSIS — Z780 Asymptomatic menopausal state: Secondary | ICD-10-CM

## 2020-01-13 DIAGNOSIS — Z01419 Encounter for gynecological examination (general) (routine) without abnormal findings: Secondary | ICD-10-CM | POA: Diagnosis not present

## 2020-01-13 NOTE — Progress Notes (Signed)
Jean Pittman Jul 22, 1960 [id number]   History:    59 y.o. G3P3L3 Married  RP:  Established patient presenting for annual gyn exam   HPI: Postmenopause, well on no HRT.  No PMB.  No pelvic pain.  Small bulge in midline above the umbilicus with abdominal pressure, no pain, retractable.  Abstinent, husband with ED.  Breasts normal.  BMI 24.69.  Walking regularly.  Health labs with Fam MD 08/2018.  Past medical history,surgical history, family history and social history were all reviewed and documented in the EPIC chart.  Gynecologic History Patient's last menstrual period was 07/01/2013.  Obstetric History OB History  Gravida Para Term Preterm AB Living  3 3     0 3  SAB TAB Ectopic Multiple Live Births      0        # Outcome Date GA Lbr Len/2nd Weight Sex Delivery Anes PTL Lv  3 Para           2 Para           1 Para              ROS: A ROS was performed and pertinent positives and negatives are included in the history.  GENERAL: No fevers or chills. HEENT: No change in vision, no earache, sore throat or sinus congestion. NECK: No pain or stiffness. CARDIOVASCULAR: No chest pain or pressure. No palpitations. PULMONARY: No shortness of breath, cough or wheeze. GASTROINTESTINAL: No abdominal pain, nausea, vomiting or diarrhea, melena or bright red blood per rectum. GENITOURINARY: No urinary frequency, urgency, hesitancy or dysuria. MUSCULOSKELETAL: No joint or muscle pain, no back pain, no recent trauma. DERMATOLOGIC: No rash, no itching, no lesions. ENDOCRINE: No polyuria, polydipsia, no heat or cold intolerance. No recent change in weight. HEMATOLOGICAL: No anemia or easy bruising or bleeding. NEUROLOGIC: No headache, seizures, numbness, tingling or weakness. PSYCHIATRIC: No depression, no loss of interest in normal activity or change in sleep pattern.     Exam:   BP (!) 150/90   Ht 5\' 6"  (1.676 m)   Wt 153 lb (69.4 kg)   LMP 07/01/2013   BMI 24.69 kg/m   Body mass  index is 24.69 kg/m.  General appearance : Well developed well nourished female. No acute distress HEENT: Eyes: no retinal hemorrhage or exudates,  Neck supple, trachea midline, no carotid bruits, no thyroidmegaly Lungs: Clear to auscultation, no rhonchi or wheezes, or rib retractions  Heart: Regular rate and rhythm, no murmurs or gallops Breast:Examined in sitting and supine position were symmetrical in appearance, no palpable masses or tenderness,  no skin retraction, no nipple inversion, no nipple discharge, no skin discoloration, no axillary or supraclavicular lymphadenopathy Abdomen: no palpable masses or tenderness, no rebound or guarding Extremities: no edema or skin discoloration or tenderness  Pelvic: Vulva: Normal             Vagina: No gross lesions or discharge  Cervix: No gross lesions or discharge.  Pap reflex done.  Uterus  AV, normal size, shape and consistency, non-tender and mobile  Adnexa  Without masses or tenderness  Anus: Normal   Assessment/Plan:  59 y.o. female for annual exam   1. Encounter for routine gynecological examination with Papanicolaou smear of cervix Normal gynecologic exam in menopause.  Pap reflex done.  Breast exam normal.  Screening mammogram April 2021 was negative.  Colonoscopy normal in 2016.  Good body mass index at 24.69.  Continue with fitness and healthy nutrition.  Health labs with family physician.  2. Postmenopausal Well on no hormone replacement therapy.  No postmenopausal bleeding.  Bone density normal in May 2018.  Continue with regular weightbearing physical activities.  Vitamin D supplements and calcium intake of 1500 mg daily recommended.  Other orders - cetirizine (ZYRTEC) 10 MG tablet; Take 10 mg by mouth daily. - Magnesium 300 MG CAPS; Take by mouth. - Coenzyme Q10 (COQ10) 100 MG CAPS; Take by mouth. - Milk Thistle 500 MG CAPS; Take by mouth. - Omega-3 1000 MG CAPS; Take by mouth. - Turmeric 500 MG CAPS; Take by mouth. -  MAGNESIUM LACTATE PO; Take 210 mg by mouth. - niacinamide 500 MG tablet; Take 500 mg by mouth 2 (two) times daily with a meal. - niacin 100 MG tablet; Take 100 mg by mouth at bedtime. - pyridOXINE (VITAMIN B-6) 50 MG tablet; Take 50 mg by mouth daily.  Princess Bruins MD, 4:23 PM 01/13/2020

## 2020-01-14 NOTE — Addendum Note (Signed)
Addended by: Thurnell Garbe A on: 01/14/2020 08:33 AM   Modules accepted: Orders

## 2020-01-16 LAB — PAP IG W/ RFLX HPV ASCU

## 2020-03-09 ENCOUNTER — Ambulatory Visit: Payer: 59

## 2020-03-18 ENCOUNTER — Ambulatory Visit: Payer: 59

## 2020-03-18 ENCOUNTER — Other Ambulatory Visit: Payer: Self-pay

## 2020-03-19 ENCOUNTER — Ambulatory Visit (INDEPENDENT_AMBULATORY_CARE_PROVIDER_SITE_OTHER): Payer: 59

## 2020-03-19 DIAGNOSIS — Z23 Encounter for immunization: Secondary | ICD-10-CM | POA: Diagnosis not present

## 2020-05-27 ENCOUNTER — Encounter: Payer: Self-pay | Admitting: Internal Medicine

## 2020-07-09 ENCOUNTER — Other Ambulatory Visit: Payer: Self-pay | Admitting: Obstetrics & Gynecology

## 2020-07-09 DIAGNOSIS — Z1231 Encounter for screening mammogram for malignant neoplasm of breast: Secondary | ICD-10-CM

## 2020-09-03 ENCOUNTER — Other Ambulatory Visit: Payer: Self-pay

## 2020-09-03 ENCOUNTER — Ambulatory Visit
Admission: RE | Admit: 2020-09-03 | Discharge: 2020-09-03 | Disposition: A | Discharge status: Home / Self Care | Payer: 59 | Source: Ambulatory Visit | Attending: Obstetrics & Gynecology | Admitting: Obstetrics & Gynecology

## 2020-09-03 DIAGNOSIS — Z1231 Encounter for screening mammogram for malignant neoplasm of breast: Secondary | ICD-10-CM

## 2020-10-21 NOTE — Patient Instructions (Addendum)
Blood work was ordered.     Medications changes include :   none     Please followup in 1 year    Health Maintenance, Female Adopting a healthy lifestyle and getting preventive care are important in promoting health and wellness. Ask your health care provider about: The right schedule for you to have regular tests and exams. Things you can do on your own to prevent diseases and keep yourself healthy. What should I know about diet, weight, and exercise? Eat a healthy diet  Eat a diet that includes plenty of vegetables, fruits, low-fat dairy products, and lean protein. Do not eat a lot of foods that are high in solid fats, added sugars, or sodium.  Maintain a healthy weight Body mass index (BMI) is used to identify weight problems. It estimates body fat based on height and weight. Your health care provider can help determineyour BMI and help you achieve or maintain a healthy weight. Get regular exercise Get regular exercise. This is one of the most important things you can do for your health. Most adults should: Exercise for at least 150 minutes each week. The exercise should increase your heart rate and make you sweat (moderate-intensity exercise). Do strengthening exercises at least twice a week. This is in addition to the moderate-intensity exercise. Spend less time sitting. Even light physical activity can be beneficial. Watch cholesterol and blood lipids Have your blood tested for lipids and cholesterol at 61 years of age, then havethis test every 5 years. Have your cholesterol levels checked more often if: Your lipid or cholesterol levels are high. You are older than 61 years of age. You are at high risk for heart disease. What should I know about cancer screening? Depending on your health history and family history, you may need to have cancer screening at various ages. This may include screening for: Breast cancer. Cervical cancer. Colorectal cancer. Skin cancer. Lung  cancer. What should I know about heart disease, diabetes, and high blood pressure? Blood pressure and heart disease High blood pressure causes heart disease and increases the risk of stroke. This is more likely to develop in people who have high blood pressure readings, are of African descent, or are overweight. Have your blood pressure checked: Every 3-5 years if you are 18-39 years of age. Every year if you are 40 years old or older. Diabetes Have regular diabetes screenings. This checks your fasting blood sugar level. Have the screening done: Once every three years after age 40 if you are at a normal weight and have a low risk for diabetes. More often and at a younger age if you are overweight or have a high risk for diabetes. What should I know about preventing infection? Hepatitis B If you have a higher risk for hepatitis B, you should be screened for this virus. Talk with your health care provider to find out if you are at risk forhepatitis B infection. Hepatitis C Testing is recommended for: Everyone born from 1945 through 1965. Anyone with known risk factors for hepatitis C. Sexually transmitted infections (STIs) Get screened for STIs, including gonorrhea and chlamydia, if: You are sexually active and are younger than 61 years of age. You are older than 61 years of age and your health care provider tells you that you are at risk for this type of infection. Your sexual activity has changed since you were last screened, and you are at increased risk for chlamydia or gonorrhea. Ask your health care provider if you are   at risk. Ask your health care provider about whether you are at high risk for HIV. Your health care provider may recommend a prescription medicine to help prevent HIV infection. If you choose to take medicine to prevent HIV, you should first get tested for HIV. You should then be tested every 3 months for as long as you are taking the medicine. Pregnancy If you are about  to stop having your period (premenopausal) and you may become pregnant, seek counseling before you get pregnant. Take 400 to 800 micrograms (mcg) of folic acid every day if you become pregnant. Ask for birth control (contraception) if you want to prevent pregnancy. Osteoporosis and menopause Osteoporosis is a disease in which the bones lose minerals and strength with aging. This can result in bone fractures. If you are 65 years old or older, or if you are at risk for osteoporosis and fractures, ask your health care provider if you should: Be screened for bone loss. Take a calcium or vitamin D supplement to lower your risk of fractures. Be given hormone replacement therapy (HRT) to treat symptoms of menopause. Follow these instructions at home: Lifestyle Do not use any products that contain nicotine or tobacco, such as cigarettes, e-cigarettes, and chewing tobacco. If you need help quitting, ask your health care provider. Do not use street drugs. Do not share needles. Ask your health care provider for help if you need support or information about quitting drugs. Alcohol use Do not drink alcohol if: Your health care provider tells you not to drink. You are pregnant, may be pregnant, or are planning to become pregnant. If you drink alcohol: Limit how much you use to 0-1 drink a day. Limit intake if you are breastfeeding. Be aware of how much alcohol is in your drink. In the [place] one drink equals one 12 oz bottle of beer (355 mL), one 5 oz glass of wine (148 mL), or one 1 oz glass of hard liquor (44 mL). General instructions Schedule regular health, dental, and eye exams. Stay current with your vaccines. Tell your health care provider if: You often feel depressed. You have ever been abused or do not feel safe at home. Summary Adopting a healthy lifestyle and getting preventive care are important in promoting health and wellness. Follow your health care provider's instructions about  healthy diet, exercising, and getting tested or screened for diseases. Follow your health care provider's instructions on monitoring your cholesterol and blood pressure. This information is not intended to replace advice given to you by your health care provider. Make sure you discuss any questions you have with your healthcare provider. Document Revised: 02/07/2018 Document Reviewed: 02/07/2018 Elsevier Patient Education  2022 Elsevier Inc.  

## 2020-10-21 NOTE — Progress Notes (Signed)
Subjective:    Patient ID: Jean Pittman, female    DOB: 04-20-1960, 60 y.o.   MRN: [id number]   This visit occurred during the SARS-CoV-2 public health emergency.  Safety protocols were in place, including screening questions prior to the visit, additional usage of staff PPE, and extensive cleaning of exam room while observing appropriate contact time as indicated for disinfecting solutions.    HPI She is here for a physical exam.   She is still recovering from covid.  She still has some fatigue.  Fatigue is mild and otherwise she feels good.   Medications and allergies reviewed with patient and updated if appropriate.  Patient Active Problem List   Diagnosis Date Noted   Hyperglycemia 09/20/2018   Upper airway cough syndrome 01/10/2017   Chronic cough 03/22/2016   EKG abnormalities 02/15/2012   ADENOMATOUS COLONIC POLYP 04/16/2010   REACTIVE AIRWAY DISEASE 04/16/2010   Hyperlipidemia 03/12/2009    Current Outpatient Medications on File Prior to Visit  Medication Sig Dispense Refill   Cyanocobalamin (B-12 PO) Take by mouth.     Levocetirizine Dihydrochloride (XYZAL PO) Take by mouth.     Magnesium 300 MG CAPS Take by mouth.     MELATONIN PO Take 1 tablet by mouth at bedtime as needed (sleep).      No current facility-administered medications on file prior to visit.    Past Medical History:  Diagnosis Date   Arthritis    Dizziness    Hyperlipidemia    denies   Tubular adenoma of colon 03/30/09   Dr Sharlett Iles    Past Surgical History:  Procedure Laterality Date   APPENDECTOMY     with 1st pregnancy   BRAVO [place] STUDY N/A 11/30/2017   Procedure: BRAVO [place];  Surgeon: Milus Banister, MD;  Location: [facility];  Service: Endoscopy;  Laterality: N/A;   BREAST BIOPSY     needle aspiration X1; resection X 1   colonoscopy with polypectomy  03/30/2009   Dr Sharlett Iles   CT SCAN  2017   Chest due to a possible blood clot but came back normal    ESOPHAGOGASTRODUODENOSCOPY (EGD) WITH PROPOFOL N/A 11/30/2017   Procedure: ESOPHAGOGASTRODUODENOSCOPY (EGD) WITH PROPOFOL;  Surgeon: Milus Banister, MD;  Location: [facility];  Service: Endoscopy;  Laterality: N/A;  48 hour Ph probe   frozen shoulder Left    broke the cartilage-was put to sleep Dr. Joni Fears apox 2014   G 3 P 3     3 C sections   TUBAL LIGATION      Social History   Socioeconomic History   Marital status: Married    Spouse name: Not on file   Number of children: Not on file   Years of education: Not on file   Highest education level: Not on file  Occupational History   Not on file  Tobacco Use   Smoking status: Never   Smokeless tobacco: Never  Vaping Use   Vaping Use: Never used  Substance and Sexual Activity   Alcohol use: No    Comment: rare   Drug use: No   Sexual activity: Not Currently    Partners: Male    Comment: 1st intercourse- 32, partners- 2, married- 41 yrs   Other Topics Concern   Not on file  Social History Narrative   Exercises regularly   Social Determinants of Health   Financial Resource Strain: Not on file  Food Insecurity: Not on file  Transportation Needs:  Not on file  Physical Activity: Not on file  Stress: Not on file  Social Connections: Not on file    Family History  Problem Relation Age of Onset   Breast cancer Maternal Aunt    Diabetes Maternal Aunt    Stroke Maternal Aunt        ? . one M aunt;> 65   Heart disease Father    Hyperlipidemia Father    Colon cancer Neg Hx     Review of Systems  Constitutional:  Positive for fatigue (mild from covid last month). Negative for chills and fever.  Eyes:  Negative for visual disturbance.  Respiratory:  Positive for cough (chronic). Negative for shortness of breath and wheezing.        Feels like she can not get a deep breath since covid  Cardiovascular:  Negative for chest pain, palpitations and leg swelling.  Gastrointestinal:  Negative for abdominal pain,  blood in stool, constipation (mild), diarrhea and nausea.       No gerd  Genitourinary:  Negative for dysuria.  Musculoskeletal:  Positive for arthralgias (mild, b/l shoulders). Negative for back pain.  Skin:  Negative for color change and rash.  Neurological:  Negative for light-headedness and headaches.  Psychiatric/Behavioral:  Negative for dysphoric mood. The patient is not nervous/anxious.       Objective:   Vitals:   10/22/20 1021  BP: 120/70  Pulse: 70  Temp: 98 F (36.7 C)  SpO2: 99%   Filed Weights   10/22/20 1021  Weight: 151 lb (68.5 kg)   Body mass index is 24.37 kg/m.  BP Readings from Last 3 Encounters:  10/22/20 120/70  01/13/20 (!) 150/90  10/21/19 (!) 142/82    Wt Readings from Last 3 Encounters:  10/22/20 151 lb (68.5 kg)  01/13/20 153 lb (69.4 kg)  10/21/19 153 lb (69.4 kg)    Depression screen [facility] 2/9 10/21/2019 09/21/2018  Decreased Interest 0 0  Down, Depressed, Hopeless 0 0  PHQ - 2 Score 0 0    No flowsheet data found.     Physical Exam Constitutional: She appears well-developed and well-nourished. No distress.  HENT:  Head: Normocephalic and atraumatic.  Right Ear: External ear normal. Normal ear canal and TM Left Ear: External ear normal.  Normal ear canal and TM Mouth/Throat: Oropharynx is clear and moist.  Eyes: Conjunctivae and EOM are normal.  Neck: Neck supple. No tracheal deviation present. No thyromegaly present.  No carotid bruit  Cardiovascular: Normal rate, regular rhythm and normal heart sounds.   No murmur heard.  No edema. Pulmonary/Chest: Effort normal and breath sounds normal. No respiratory distress. She has no wheezes. She has no rales.  Breast: deferred   Abdominal: Soft. She exhibits no distension. There is no tenderness.  Lymphadenopathy: She has no cervical adenopathy.  Skin: Skin is warm and dry. She is not diaphoretic.  Psychiatric: She has a normal mood and affect. Her behavior is normal.     Lab  Results  Component Value Date   WBC 4.4 08/26/2019   HGB 13.7 08/26/2019   HCT 40.0 08/26/2019   PLT 205.0 08/26/2019   GLUCOSE 97 08/26/2019   CHOL 247 (H) 08/26/2019   TRIG 135.0 08/26/2019   HDL 55.70 08/26/2019   LDLDIRECT 133.4 02/15/2012   LDLCALC 164 (H) 08/26/2019   ALT 12 08/26/2019   AST 16 08/26/2019   NA 139 08/26/2019   K 4.2 08/26/2019   CL 103 08/26/2019   CREATININE 0.90 08/26/2019  BUN 14 08/26/2019   CO2 27 08/26/2019   TSH 4.02 08/26/2019   INR 1.0 10/12/2015   HGBA1C 5.5 08/26/2019         Assessment & Plan:   Physical exam: Screening blood work  ordered Exercise  regular Weight  normal Substance abuse  none  Saw derm Monday.   Screened for depression using the PHQ 9 scale.  No evidence of depression.   Screened for anxiety using GAD7 Scale.  No evidence of anxiety.   Reviewed recommended immunizations.   Health Maintenance  Topic Date Due   COVID-19 Vaccine (1) Never done   PAP SMEAR-Modifier  12/23/2019   INFLUENZA VACCINE  05/28/2021 (Originally 09/28/2020)   DEXA SCAN  01/05/2022   MAMMOGRAM  09/04/2022   COLONOSCOPY (Pts 45-12yr Insurance coverage will need to be confirmed)  07/15/2024   TETANUS/TDAP  10/20/2029   Hepatitis C Screening  Completed   HIV Screening  Completed   Zoster Vaccines- Shingrix  Completed   Pneumococcal Vaccine 03692Years old  Aged Out   HPV VACCINES  Aged Out          See Problem List for Assessment and Plan of chronic medical problems.

## 2020-10-22 ENCOUNTER — Other Ambulatory Visit: Payer: Self-pay

## 2020-10-22 ENCOUNTER — Ambulatory Visit (INDEPENDENT_AMBULATORY_CARE_PROVIDER_SITE_OTHER): Payer: 59 | Admitting: Internal Medicine

## 2020-10-22 ENCOUNTER — Encounter: Payer: Self-pay | Admitting: Internal Medicine

## 2020-10-22 VITALS — BP 120/70 | HR 70 | Temp 98.0°F | Ht 66.0 in | Wt 151.0 lb

## 2020-10-22 DIAGNOSIS — Z0000 Encounter for general adult medical examination without abnormal findings: Secondary | ICD-10-CM | POA: Diagnosis not present

## 2020-10-22 DIAGNOSIS — E782 Mixed hyperlipidemia: Secondary | ICD-10-CM | POA: Diagnosis not present

## 2020-10-22 DIAGNOSIS — R053 Chronic cough: Secondary | ICD-10-CM | POA: Diagnosis not present

## 2020-10-22 DIAGNOSIS — R739 Hyperglycemia, unspecified: Secondary | ICD-10-CM | POA: Diagnosis not present

## 2020-10-22 DIAGNOSIS — Z1331 Encounter for screening for depression: Secondary | ICD-10-CM

## 2020-10-22 LAB — CBC WITH DIFFERENTIAL/PLATELET
Basophils Absolute: 0.1 10*3/uL (ref 0.0–0.1)
Basophils Relative: 1.2 % (ref 0.0–3.0)
Eosinophils Absolute: 0.1 10*3/uL (ref 0.0–0.7)
Eosinophils Relative: 1.3 % (ref 0.0–5.0)
HCT: 37.6 % (ref 36.0–46.0)
Hemoglobin: 12.7 g/dL (ref 12.0–15.0)
Lymphocytes Relative: 24.1 % (ref 12.0–46.0)
Lymphs Abs: 1.4 10*3/uL (ref 0.7–4.0)
MCHC: 33.6 g/dL (ref 30.0–36.0)
MCV: 90.7 fl (ref 78.0–100.0)
Monocytes Absolute: 0.5 10*3/uL (ref 0.1–1.0)
Monocytes Relative: 9.7 % (ref 3.0–12.0)
Neutro Abs: 3.6 10*3/uL (ref 1.4–7.7)
Neutrophils Relative %: 63.7 % (ref 43.0–77.0)
Platelets: 209 10*3/uL (ref 150.0–400.0)
RBC: 4.15 Mil/uL (ref 3.87–5.11)
RDW: 14.2 % (ref 11.5–15.5)
WBC: 5.7 10*3/uL (ref 4.0–10.5)

## 2020-10-22 LAB — LIPID PANEL
Cholesterol: 222 mg/dL — ABNORMAL HIGH (ref 0–200)
HDL: 61.6 mg/dL (ref >39.00)
LDL Cholesterol: 143 mg/dL — ABNORMAL HIGH (ref 0–99)
NonHDL: 160.73
Total CHOL/HDL Ratio: 4
Triglycerides: 90 mg/dL (ref 0.0–149.0)
VLDL: 18 mg/dL (ref 0.0–40.0)

## 2020-10-22 LAB — COMPREHENSIVE METABOLIC PANEL
ALT: 16 U/L (ref 0–35)
AST: 20 U/L (ref 0–37)
Albumin: 4.3 g/dL (ref 3.5–5.2)
Alkaline Phosphatase: 29 U/L — ABNORMAL LOW (ref 39–117)
BUN: 18 mg/dL (ref 6–23)
CO2: 30 mEq/L (ref 19–32)
Calcium: 9.6 mg/dL (ref 8.4–10.5)
Chloride: 107 mEq/L (ref 96–112)
Creatinine, Ser: 0.99 mg/dL (ref 0.40–1.20)
GFR: 61.49 mL/min (ref >60.00)
Glucose, Bld: 80 mg/dL (ref 70–99)
Potassium: 4.9 mEq/L (ref 3.5–5.1)
Sodium: 143 mEq/L (ref 135–145)
Total Bilirubin: 0.5 mg/dL (ref 0.2–1.2)
Total Protein: 7.1 g/dL (ref 6.0–8.3)

## 2020-10-22 LAB — HEMOGLOBIN A1C: Hgb A1c MFr Bld: 5.6 % (ref 4.6–6.5)

## 2020-10-22 LAB — TSH: TSH: 2.26 u[IU]/mL (ref 0.35–5.50)

## 2020-10-22 NOTE — Assessment & Plan Note (Signed)
Chronic Likely neurogenic Stable Symptomatic relief

## 2020-10-22 NOTE — Assessment & Plan Note (Signed)
Chronic Check lipid panel  Lifestyle controlled Regular exercise and healthy diet encouraged  

## 2020-10-22 NOTE — Assessment & Plan Note (Signed)
Chronic Check a1c Low sugar / carb diet Stressed regular exercise  

## 2020-11-28 ENCOUNTER — Encounter: Payer: Self-pay | Admitting: Internal Medicine

## 2021-01-14 ENCOUNTER — Ambulatory Visit: Payer: Self-pay | Admitting: Obstetrics & Gynecology

## 2021-01-18 ENCOUNTER — Encounter: Payer: Self-pay | Admitting: Internal Medicine

## 2021-01-18 NOTE — Progress Notes (Signed)
Subjective:    Patient ID: Randol Kern, female    DOB: 03-Jul-1960, 60 y.o.   MRN: [id number]  This visit occurred during the SARS-CoV-2 public health emergency.  Safety protocols were in place, including screening questions prior to the visit, additional usage of staff PPE, and extensive cleaning of exam room while observing appropriate contact time as indicated for disinfecting solutions.    HPI The patient is here for an acute visit.  Friday Nov 11 - she had to put her dog down and cried a lot.  She had pressure in her head after and has not felt well since.  She states body aches, cough, fatigue.  On the 11th she was exposed to [place].  On the 16th her hearing in the right ear diminished a great deal.  She went to urgent care - dx with sinusitis and rattling in bronchi.  She was prescribed doxycycline, promethazine dm and had a steroid injection.  She was not tested for flu or COVID at that time.  Her hearing is still decreased.  She has no energy.  Her cough his better.  She is currently taking doxycycline, cough medication    Medications and allergies reviewed with patient and updated if appropriate.  Patient Active Problem List   Diagnosis Date Noted   Hyperglycemia 09/20/2018   Upper airway cough syndrome 01/10/2017   Chronic cough 03/22/2016   EKG abnormalities 02/15/2012   ADENOMATOUS COLONIC POLYP 04/16/2010   REACTIVE AIRWAY DISEASE 04/16/2010   Hyperlipidemia 03/12/2009    Current Outpatient Medications on File Prior to Visit  Medication Sig Dispense Refill   Cyanocobalamin (B-12 PO) Take by mouth.     doxycycline (VIBRAMYCIN) 100 MG capsule Take 100 mg by mouth 2 (two) times daily.     Levocetirizine Dihydrochloride (XYZAL PO) Take by mouth.     Magnesium 300 MG CAPS Take by mouth.     MELATONIN PO Take 1 tablet by mouth at bedtime as needed (sleep).      promethazine-dextromethorphan (PROMETHAZINE-DM) 6.25-15 MG/5ML syrup Take 5 mLs by mouth 4 (four) times  daily as needed.     No current facility-administered medications on file prior to visit.    Past Medical History:  Diagnosis Date   Arthritis    Dizziness    Hyperlipidemia    denies   Tubular adenoma of colon 03/30/09   Dr Sharlett Iles    Past Surgical History:  Procedure Laterality Date   APPENDECTOMY     with 1st pregnancy   BRAVO [place] STUDY N/A 11/30/2017   Procedure: BRAVO [place];  Surgeon: Milus Banister, MD;  Location: [facility];  Service: Endoscopy;  Laterality: N/A;   BREAST BIOPSY     needle aspiration X1; resection X 1   colonoscopy with polypectomy  03/30/2009   Dr Sharlett Iles   CT SCAN  2017   Chest due to a possible blood clot but came back normal   ESOPHAGOGASTRODUODENOSCOPY (EGD) WITH PROPOFOL N/A 11/30/2017   Procedure: ESOPHAGOGASTRODUODENOSCOPY (EGD) WITH PROPOFOL;  Surgeon: Milus Banister, MD;  Location: [facility];  Service: Endoscopy;  Laterality: N/A;  48 hour Ph probe   frozen shoulder Left    broke the cartilage-was put to sleep Dr. Joni Fears apox 2014   G 3 P 3     3 C sections   TUBAL LIGATION      Social History   Socioeconomic History   Marital status: Married    Spouse name: Not on file  Number of children: Not on file   Years of education: Not on file   Highest education level: Not on file  Occupational History   Not on file  Tobacco Use   Smoking status: Never   Smokeless tobacco: Never  Vaping Use   Vaping Use: Never used  Substance and Sexual Activity   Alcohol use: No    Comment: rare   Drug use: No   Sexual activity: Not Currently    Partners: Male    Comment: 1st intercourse- 26, partners- 2, married- 37 yrs   Other Topics Concern   Not on file  Social History Narrative   Exercises regularly   Social Determinants of Health   Financial Resource Strain: Not on file  Food Insecurity: Not on file  Transportation Needs: Not on file  Physical Activity: Not on file  Stress: Not on file  Social Connections: Not  on file    Family History  Problem Relation Age of Onset   Breast cancer Maternal Aunt    Diabetes Maternal Aunt    Stroke Maternal Aunt        ? . one M aunt;> 65   Heart disease Father    Hyperlipidemia Father    Colon cancer Neg Hx     Review of Systems  Constitutional:  Negative for fever.  HENT:  Positive for ear pain (right ear at times), hearing loss (right ear) and postnasal drip. Negative for congestion, sinus pressure, sinus pain and sore throat.        Both ears are clogged  Eyes:        Eyes feel tired  Respiratory:  Positive for cough, shortness of breath (maybe a little) and wheezing (maybe a little).   Musculoskeletal:  Positive for myalgias.      Objective:   Vitals:   01/19/21 0956  BP: 122/68  Pulse: 69  Temp: 98.1 F (36.7 C)  SpO2: 99%   BP Readings from Last 3 Encounters:  01/19/21 122/68  10/22/20 120/70  01/13/20 (!) 150/90   Wt Readings from Last 3 Encounters:  01/19/21 145 lb (65.8 kg)  10/22/20 151 lb (68.5 kg)  01/13/20 153 lb (69.4 kg)   Body mass index is 23.4 kg/m.   Physical Exam    GENERAL APPEARANCE: Appears stated age, well appearing, NAD EYES: conjunctiva clear, no icterus HENT: Left ear canal and tympanic membrane normal, right ear canal with minimal cerumen that is slightly blocking complete view of the tympanic membrane, visualized tympanic membrane shows swelling with possible effusion, oropharynx with no erythema or exudates, trachea midline, no cervical or supraclavicular lymphadenopathy LUNGS: Unlabored breathing, good air entry bilaterally, clear to auscultation without wheeze or crackles CARDIOVASCULAR: Normal S1,S2 , no edema SKIN: Warm, dry      Assessment & Plan:    See Problem List for Assessment and Plan of chronic medical problems.

## 2021-01-19 ENCOUNTER — Encounter: Payer: Self-pay | Admitting: Internal Medicine

## 2021-01-19 ENCOUNTER — Other Ambulatory Visit: Payer: Self-pay

## 2021-01-19 ENCOUNTER — Ambulatory Visit: Payer: 59 | Admitting: Internal Medicine

## 2021-01-19 VITALS — BP 122/68 | HR 69 | Temp 98.1°F | Ht 66.0 in | Wt 145.0 lb

## 2021-01-19 DIAGNOSIS — J019 Acute sinusitis, unspecified: Secondary | ICD-10-CM | POA: Insufficient documentation

## 2021-01-19 DIAGNOSIS — H9191 Unspecified hearing loss, right ear: Secondary | ICD-10-CM | POA: Diagnosis not present

## 2021-01-19 DIAGNOSIS — Z1211 Encounter for screening for malignant neoplasm of colon: Secondary | ICD-10-CM

## 2021-01-19 DIAGNOSIS — Z800 Family history of malignant neoplasm of digestive organs: Secondary | ICD-10-CM

## 2021-01-19 MED ORDER — PREDNISONE 50 MG PO TABS: 50.0000 mg | ORAL_TABLET | Freq: Every day | ORAL | 0 refills | Status: DC

## 2021-01-19 MED ORDER — METHYLPREDNISOLONE ACETATE 80 MG/ML IJ SUSP
80.0000 mg | Freq: Once | INTRAMUSCULAR | Status: AC
  Administered 2021-01-19: 80 mg via INTRAMUSCULAR

## 2021-01-19 NOTE — Patient Instructions (Addendum)
    You had steroid injection today.   Start taking sudafed daily.  Prednisone 50 mg daily x 5 days.   Your prescription(s) have been submitted to your pharmacy. Please take as directed and contact our office if you believe you are having problem(s) with the medication(s).    A referral was ordered for ENT.

## 2021-01-19 NOTE — Assessment & Plan Note (Signed)
Acute She will complete the doxycycline-she has a few days left Start Sudafed and take daily Continue cough syrup and other over-the-counter cold medications for symptom relief Rest, fluids

## 2021-01-19 NOTE — Assessment & Plan Note (Signed)
Acute Related to sinus infection/eustachian tube dysfunction Discussed the concern of not regaining all of her hearing back and needing to be aggressive with treating the eustachian tube dysfunction and sinus infection Start Sudafed and take daily Depo-Medrol 80 mg IM x1 today Start prednisone 50 mg daily x5 days tomorrow Referral ordered for ENT-discussed that she can also call and try to schedule an appointment if her insurance does not require a referral

## 2021-01-25 MED ORDER — ALPRAZOLAM 0.25 MG PO TABS: 0.2500 mg | ORAL_TABLET | Freq: Two times a day (BID) | ORAL | 0 refills | Status: DC | PRN

## 2021-02-07 MED ORDER — ALPRAZOLAM 0.25 MG PO TABS: 0.2500 mg | ORAL_TABLET | Freq: Two times a day (BID) | ORAL | 0 refills | Status: DC | PRN

## 2021-02-07 NOTE — Addendum Note (Signed)
Addended by: Binnie Rail on: 02/07/2021 10:14 AM   Modules accepted: Orders

## 2021-03-02 NOTE — Addendum Note (Signed)
Addended by: Binnie Rail on: 03/02/2021 09:18 PM   Modules accepted: Orders

## 2021-04-02 ENCOUNTER — Ambulatory Visit: Payer: Self-pay | Admitting: Physician Assistant

## 2021-04-23 ENCOUNTER — Ambulatory Visit (INDEPENDENT_AMBULATORY_CARE_PROVIDER_SITE_OTHER): Payer: Managed Care, Other (non HMO) | Admitting: Obstetrics & Gynecology

## 2021-04-23 ENCOUNTER — Other Ambulatory Visit: Payer: Self-pay

## 2021-04-23 ENCOUNTER — Encounter: Payer: Self-pay | Admitting: Obstetrics & Gynecology

## 2021-04-23 VITALS — BP 116/78 | HR 74 | Resp 16 | Ht 65.75 in | Wt 141.0 lb

## 2021-04-23 DIAGNOSIS — Z13820 Encounter for screening for osteoporosis: Secondary | ICD-10-CM

## 2021-04-23 DIAGNOSIS — Z01419 Encounter for gynecological examination (general) (routine) without abnormal findings: Secondary | ICD-10-CM | POA: Diagnosis not present

## 2021-04-23 DIAGNOSIS — Z780 Asymptomatic menopausal state: Secondary | ICD-10-CM | POA: Diagnosis not present

## 2021-04-23 NOTE — Progress Notes (Signed)
Jean Pittman January 10, 1961 [id number]   History:    61 y.o.  G3P3L3 Married   RP:  Established patient presenting for annual gyn exam    HPI: Postmenopause, well on no HRT.  No PMB.  No pelvic pain. Abstinent, husband with ED. Pap Neg 12/2019. Breasts normal.  Mammo Neg 08/2020. BMI 22.93.  Walking regularly.  Health labs with Fam MD. Bone Density normal in 12/2016.  Schedule BD here in 12/2021.  Mother with Colon Ca at 58 yo.  Last Colono 2016. Will schedule a Colonoscopy this year.   Past medical history,surgical history, family history and social history were all reviewed and documented in the EPIC chart.  Gynecologic History Patient's last menstrual period was 07/01/2013.  Obstetric History OB History  Gravida Para Term Preterm AB Living  3 3     0 3  SAB IAB Ectopic Multiple Live Births      0        # Outcome Date GA Lbr Len/2nd Weight Sex Delivery Anes PTL Lv  3 Para           2 Para           1 Para              ROS: A ROS was performed and pertinent positives and negatives are included in the history.  GENERAL: No fevers or chills. HEENT: No change in vision, no earache, sore throat or sinus congestion. NECK: No pain or stiffness. CARDIOVASCULAR: No chest pain or pressure. No palpitations. PULMONARY: No shortness of breath, cough or wheeze. GASTROINTESTINAL: No abdominal pain, nausea, vomiting or diarrhea, melena or bright red blood per rectum. GENITOURINARY: No urinary frequency, urgency, hesitancy or dysuria. MUSCULOSKELETAL: No joint or muscle pain, no back pain, no recent trauma. DERMATOLOGIC: No rash, no itching, no lesions. ENDOCRINE: No polyuria, polydipsia, no heat or cold intolerance. No recent change in weight. HEMATOLOGICAL: No anemia or easy bruising or bleeding. NEUROLOGIC: No headache, seizures, numbness, tingling or weakness. PSYCHIATRIC: No depression, no loss of interest in normal activity or change in sleep pattern.     Exam:   BP 116/78    Pulse 74     Resp 16    Ht 5' 5.75" (1.67 m)    Wt 141 lb (64 kg)    LMP 07/01/2013    BMI 22.93 kg/m   Body mass index is 22.93 kg/m.  General appearance : Well developed well nourished female. No acute distress HEENT: Eyes: no retinal hemorrhage or exudates,  Neck supple, trachea midline, no carotid bruits, no thyroidmegaly Lungs: Clear to auscultation, no rhonchi or wheezes, or rib retractions  Heart: Regular rate and rhythm, no murmurs or gallops Breast:Examined in sitting and supine position were symmetrical in appearance, no palpable masses or tenderness,  no skin retraction, no nipple inversion, no nipple discharge, no skin discoloration, no axillary or supraclavicular lymphadenopathy Abdomen: no palpable masses or tenderness, no rebound or guarding Extremities: no edema or skin discoloration or tenderness  Pelvic: Vulva: Normal             Vagina: No gross lesions or discharge  Cervix: No gross lesions or discharge  Uterus  AV, normal size, shape and consistency, non-tender and mobile  Adnexa  Without masses or tenderness  Anus: Normal   Assessment/Plan:  61 y.o. female for annual exam   1. Well female exam with routine gynecological exam Postmenopause, well on no HRT.  No PMB.  No pelvic pain.  Abstinent, husband with ED. Pap Neg 12/2019. Breasts normal.  Mammo Neg 08/2020. BMI 22.93.  Walking regularly.  Health labs with Fam MD. Bone Density normal in 12/2016.  Schedule BD here in 12/2021.  Mother with Colon Ca at 104 yo.  Last Colono 2016. Will schedule a Colonoscopy this year.  2. Postmenopausal Postmenopause, well on no HRT.  No PMB.  No pelvic pain. Abstinent, husband with ED.  3. Screening for osteoporosis Vitamin D supplement, Ca++ 1.5 g/d total, regular weight bearing physical activities.  Schedule BD here now. - DG Bone Density; Future  Other orders - pseudoephedrine (SUDAFED) 30 MG tablet; Take 30 mg by mouth every 4 (four) hours as needed for congestion. - NIACIN PO; Take by  mouth. - Ascorbic Acid (VITAMIN C PO); Take by mouth.   Princess Bruins MD, 9:44 AM 04/23/2021

## 2021-08-20 ENCOUNTER — Other Ambulatory Visit: Payer: Self-pay | Admitting: Obstetrics & Gynecology

## 2021-08-20 DIAGNOSIS — Z1231 Encounter for screening mammogram for malignant neoplasm of breast: Secondary | ICD-10-CM

## 2021-09-09 ENCOUNTER — Ambulatory Visit
Admission: RE | Admit: 2021-09-09 | Discharge: 2021-09-09 | Disposition: A | Discharge status: Home / Self Care | Payer: Commercial Managed Care - HMO | Source: Ambulatory Visit | Attending: Obstetrics & Gynecology | Admitting: Obstetrics & Gynecology

## 2021-09-09 DIAGNOSIS — Z1231 Encounter for screening mammogram for malignant neoplasm of breast: Secondary | ICD-10-CM

## 2021-09-14 ENCOUNTER — Encounter: Payer: Self-pay | Admitting: Internal Medicine

## 2021-09-16 ENCOUNTER — Encounter: Payer: Self-pay | Admitting: Gastroenterology

## 2021-11-16 ENCOUNTER — Ambulatory Visit: Payer: Commercial Managed Care - HMO | Admitting: Gastroenterology

## 2021-11-23 ENCOUNTER — Ambulatory Visit (INDEPENDENT_AMBULATORY_CARE_PROVIDER_SITE_OTHER): Payer: Commercial Managed Care - HMO

## 2021-11-23 ENCOUNTER — Encounter: Payer: Self-pay | Admitting: Emergency Medicine

## 2021-11-23 ENCOUNTER — Ambulatory Visit (INDEPENDENT_AMBULATORY_CARE_PROVIDER_SITE_OTHER): Payer: Commercial Managed Care - HMO | Admitting: Emergency Medicine

## 2021-11-23 VITALS — BP 126/84 | HR 77 | Temp 98.2°F | Ht 65.75 in | Wt 141.0 lb

## 2021-11-23 DIAGNOSIS — M25511 Pain in right shoulder: Secondary | ICD-10-CM

## 2021-11-23 MED ORDER — TRAMADOL HCL 50 MG PO TABS: 50.0000 mg | ORAL_TABLET | Freq: Three times a day (TID) | ORAL | 0 refills | Status: DC | PRN

## 2021-11-23 MED ORDER — KETOROLAC TROMETHAMINE 60 MG/2ML IM SOLN
60.0000 mg | Freq: Once | INTRAMUSCULAR | Status: AC
  Administered 2021-11-23: 60 mg via INTRAMUSCULAR

## 2021-11-23 NOTE — Patient Instructions (Signed)

## 2021-11-23 NOTE — Assessment & Plan Note (Signed)
Differential diagnosis discussed. X-ray report reviewed. Toradol 60 mg now Continue ibuprofen at home Take tramadol 50 mg as needed for pain Needs to follow-up with orthopedist.

## 2021-11-23 NOTE — Progress Notes (Signed)
Jean Pittman 61 y.o.   Chief Complaint  Patient presents with   Shoulder Pain    Right shoulder pain x 3 days , patient states he pain has been there for a couple of weeks but has gotten worse 3 days ago.     HISTORY OF PRESENT ILLNESS: Acute problem visit today.  Patient of Dr. Billey Gosling. This is a 61 y.o. female complaining of sharp right shoulder pain for 3 days. Denies injury.  No other associated symptoms. No other complaints or medical concerns today.  HPI   Prior to Admission medications   Medication Sig Start Date End Date Taking? Authorizing Provider  ALPRAZolam (XANAX) 0.25 MG tablet Take 1 tablet (0.25 mg total) by mouth 2 (two) times daily as needed for anxiety. 02/07/21   Binnie Rail, MD  Ascorbic Acid (VITAMIN C PO) Take by mouth.    [provider]  NIACIN PO Take by mouth.    [provider]  pseudoephedrine (SUDAFED) 30 MG tablet Take 30 mg by mouth every 4 (four) hours as needed for congestion.    [provider]    No Known Allergies  Patient Active Problem List   Diagnosis Date Noted   Decreased hearing of right ear 01/19/2021   Acute non-recurrent sinusitis 01/19/2021   Hyperglycemia 09/20/2018   Upper airway cough syndrome 01/10/2017   Chronic cough 03/22/2016   EKG abnormalities 02/15/2012   ADENOMATOUS COLONIC POLYP 04/16/2010   REACTIVE AIRWAY DISEASE 04/16/2010   Hyperlipidemia 03/12/2009    Past Medical History:  Diagnosis Date   Arthritis    Dizziness    Hyperlipidemia    denies   Tubular adenoma of colon 03/30/09   Dr Sharlett Iles    Past Surgical History:  Procedure Laterality Date   APPENDECTOMY     with 1st pregnancy   BRAVO [place] STUDY N/A 11/30/2017   Procedure: BRAVO [place] STUDY;  Surgeon: Milus Banister, MD;  Location: [facility];  Service: Endoscopy;  Laterality: N/A;   BREAST BIOPSY     needle aspiration X1; resection X 1   colonoscopy with polypectomy  03/30/2009   Dr Sharlett Iles   CT SCAN   2017   Chest due to a possible blood clot but came back normal   ESOPHAGOGASTRODUODENOSCOPY (EGD) WITH PROPOFOL N/A 11/30/2017   Procedure: ESOPHAGOGASTRODUODENOSCOPY (EGD) WITH PROPOFOL;  Surgeon: Milus Banister, MD;  Location: [facility];  Service: Endoscopy;  Laterality: N/A;  48 hour Ph probe   frozen shoulder Left    broke the cartilage-was put to sleep Dr. Joni Fears apox 2014   G 3 P 3     3 C sections   TUBAL LIGATION      Social History   Socioeconomic History   Marital status: Married    Spouse name: Not on file   Number of children: Not on file   Years of education: Not on file   Highest education level: Not on file  Occupational History   Not on file  Tobacco Use   Smoking status: Never   Smokeless tobacco: Never  Vaping Use   Vaping Use: Never used  Substance and Sexual Activity   Alcohol use: No   Drug use: No   Sexual activity: Not Currently    Partners: Male    Birth control/protection: Post-menopausal    Comment: 1st intercourse- 80, partners- 2, married- 80 yrs   Other Topics Concern   Not on file  Social History Narrative   Exercises  regularly   Social Determinants of Health   Financial Resource Strain: Not on file  Food Insecurity: Not on file  Transportation Needs: Not on file  Physical Activity: Not on file  Stress: Not on file  Social Connections: Not on file  Intimate Partner Violence: Not on file    Family History  Problem Relation Age of Onset   Cancer - Colon Mother    Heart disease Father    Hyperlipidemia Father    Stroke Maternal Aunt        ? . one M aunt;> 65   Colon cancer Maternal Aunt    Colon cancer Maternal Aunt    Breast cancer Paternal Aunt    Diabetes Paternal Aunt      Review of Systems  Constitutional: Negative.  Negative for chills and fever.  HENT: Negative.  Negative for congestion and sore throat.   Respiratory: Negative.  Negative for cough and shortness of breath.   Cardiovascular: Negative.   Negative for chest pain and palpitations.  Gastrointestinal:  Negative for abdominal pain, nausea and vomiting.  Genitourinary: Negative.  Negative for dysuria.  Musculoskeletal:  Positive for joint pain (Right shoulder).  Skin: Negative.  Negative for rash.  Neurological:  Negative for dizziness and headaches.  All other systems reviewed and are negative.   Today's Vitals   11/23/21 1329  BP: 126/84  Pulse: 77  Temp: 98.2 F (36.8 C)  TempSrc: Oral  SpO2: 98%  Weight: 141 lb (64 kg)  Height: 5' 5.75" (1.67 m)   Body mass index is 22.93 kg/m.  Physical Exam Vitals reviewed.  Constitutional:      Appearance: Normal appearance.  HENT:     Head: Normocephalic.  Eyes:     Extraocular Movements: Extraocular movements intact.     Pupils: Pupils are equal, round, and reactive to light.  Cardiovascular:     Rate and Rhythm: Normal rate.  Pulmonary:     Effort: Pulmonary effort is normal.  Musculoskeletal:     Comments: Right shoulder: No erythema or ecchymosis.  Full range of motion but complaining of pain.  No swelling.  Mild diffuse tenderness Right upper extremity: Neurovascularly intact.  Good grip.  Full range of motion.  Skin:    General: Skin is warm and dry.  Neurological:     General: No focal deficit present.     Mental Status: She is alert and oriented to person, place, and time.  Psychiatric:        Mood and Affect: Mood normal.        Behavior: Behavior normal.    DG Shoulder Right  Result Date: 11/23/2021 CLINICAL DATA:  RIGHT shoulder pain for 1 month, worse for 3 days. EXAM: RIGHT SHOULDER - 2+ VIEW COMPARISON:  None available FINDINGS: Mild acromioclavicular and glenohumeral degenerative changes. No signs of acute fracture or dislocation. IMPRESSION: Degenerative changes about the RIGHT shoulder. Electronically Signed   By: Zetta Bills M.D.   On: 11/23/2021 14:17     ASSESSMENT & PLAN: Problem List Items Addressed This Visit       Other   Acute  pain of right shoulder - Primary    Differential diagnosis discussed. X-ray report reviewed. Toradol 60 mg now Continue ibuprofen at home Take tramadol 50 mg as needed for pain Needs to follow-up with orthopedist.      Relevant Medications   traMADol (ULTRAM) 50 MG tablet   Other Relevant Orders   DG Shoulder Right (Completed)   Patient Instructions  Shoulder Pain Many things can cause shoulder pain, including: An injury. Moving the shoulder in the same way again and again (overuse). Joint pain (arthritis). Pain can come from: Swelling and irritation (inflammation) of any part of the shoulder. An injury to the shoulder joint. An injury to: Tissues that connect muscle to bone (tendons). Tissues that connect bones to each other (ligaments). Bones. Follow these instructions at home: Watch for changes in your symptoms. Let your doctor know about them. Follow these instructions to help with your pain. If you have a sling: Wear the sling as told by your doctor. Remove it only as told by your doctor. Loosen the sling if your fingers: Tingle. Become numb. Turn cold and blue. Keep the sling clean. If the sling is not waterproof: Do not let it get wet. Take the sling off when you shower or bathe. Managing pain, stiffness, and swelling  If told, put ice on the painful area: Put ice in a plastic bag. Place a towel between your skin and the bag. Leave the ice on for 20 minutes, 2-3 times a day. Stop putting ice on if it does not help with the pain. Squeeze a soft ball or a foam pad as much as possible. This prevents swelling in the shoulder. It also helps to strengthen the arm. General instructions Take over-the-counter and prescription medicines only as told by your doctor. Keep all follow-up visits as told by your doctor. This is important. Contact a doctor if: Your pain gets worse. Medicine does not help your pain. You have new pain in your arm, hand, or fingers. Get help  right away if: Your arm, hand, or fingers: Tingle. Are numb. Are swollen. Are painful. Turn white or blue. Summary Shoulder pain can be caused by many things. These include injury, moving the shoulder in the same away again and again, and joint pain. Watch for changes in your symptoms. Let your doctor know about them. This condition may be treated with a sling, ice, and pain medicine. Contact your doctor if the pain gets worse or you have new pain. Get help right away if your arm, hand, or fingers tingle or get numb, swollen, or painful. Keep all follow-up visits as told by your doctor. This is important. This information is not intended to replace advice given to you by your health care provider. Make sure you discuss any questions you have with your health care provider. Document Revised: 10/30/2020 Document Reviewed: 10/30/2020 Elsevier Patient Education  [place], MD [facility]

## 2021-11-24 ENCOUNTER — Other Ambulatory Visit: Payer: Self-pay | Admitting: Emergency Medicine

## 2021-11-24 ENCOUNTER — Telehealth: Payer: Self-pay | Admitting: Internal Medicine

## 2021-11-24 DIAGNOSIS — M25511 Pain in right shoulder: Secondary | ICD-10-CM

## 2021-11-24 MED ORDER — HYDROCODONE-ACETAMINOPHEN 5-325 MG PO TABS
1.0000 | ORAL_TABLET | Freq: Four times a day (QID) | ORAL | 0 refills | Status: DC | PRN
Start: 2021-11-24 — End: 2021-11-26

## 2021-11-24 NOTE — Telephone Encounter (Signed)
Called patient and left message for patient to inform her that a new rx was sent to her pharmacy on file

## 2021-11-24 NOTE — Telephone Encounter (Signed)
New prescription for Norco sent to pharmacy of record.  Thanks

## 2021-11-24 NOTE — Telephone Encounter (Signed)
Patient saw Dr. Mitchel Honour yesterday - she states the toradol shot helped yesterday afternoon but the tramadol is not helpful since the shot wore off - she is requesting something else be sent in. Call back number is [phone]

## 2021-11-25 ENCOUNTER — Encounter: Payer: Self-pay | Admitting: Emergency Medicine

## 2021-11-26 ENCOUNTER — Telehealth: Payer: Self-pay

## 2021-11-26 ENCOUNTER — Other Ambulatory Visit: Payer: Self-pay | Admitting: Emergency Medicine

## 2021-11-26 DIAGNOSIS — M25511 Pain in right shoulder: Secondary | ICD-10-CM

## 2021-11-26 MED ORDER — HYDROCODONE-ACETAMINOPHEN 5-325 MG PO TABS: 1.0000 | ORAL_TABLET | Freq: Four times a day (QID) | ORAL | 0 refills | Status: DC | PRN

## 2021-11-26 NOTE — Telephone Encounter (Signed)
MEDICATION: HYDROcodone-acetaminophen (NORCO) 5-325 MG tablet  PHARMACY: [id number] [place], [place] - [address]  Comments: Patient seen Dr.Sagarida, the medicine helps suppress the pain but not completely. Wants to know if she can get another prescription as she will be out before she sees the Orthopedic on Tuesday.   **Let patient know to contact pharmacy at the end of the day to make sure medication is ready. **  ** Please notify patient to allow 48-72 hours to process**  **Encourage patient to contact the pharmacy for refills or they can [facility]*

## 2021-11-28 NOTE — Progress Notes (Signed)
11/28/2021 Jean Pittman [id number] 09/22/1960   CHIEF COMPLAINT: Discuss scheduling a colonoscopy   HISTORY OF PRESENT ILLNESS: Jean Pittman is a 61 year old female with a past medical history of arthritis, hyperlipidemia, chronic cough and possible GERD. She is known by Dr. Ardis Hughs.  She presents today to discuss scheduling a screening colonoscopy.  She is accompanied by her husband.  History of a tubular adenomatous polyp removed from the proximal transverse colon per colonoscopy 02/2009.  Her most recent colonoscopy was 07/16/2014 which identified one polyp which was removed from the cecum, biopsies showed benign polypoid colonic mucosa. Her mother died from colon cancer at the age of 68 within the past year and 2 maternal aunts died from colon cancer at the ages of 68 and 28.  She denies having any upper or lower abdominal pain.  She typically passes a normal bowel movement most days, however, she recently started taking hydrocodone every 6 hours due to having right shoulder pain which has resulted in constipation.  No rectal bleeding or black stools.  She denies having any GERD symptoms.  Her chronic cough thought to be due to sinus issues has improved, she is requiring less Sudafed.  No other complaints at this time.      Latest Ref Rng & Units 10/22/2020   11:00 AM 08/26/2019    9:20 AM 09/21/2018    8:35 AM  CBC  WBC 4.0 - 10.5 K/uL 5.7  4.4  5.1   Hemoglobin 12.0 - 15.0 g/dL 12.7  13.7  14.0   Hematocrit 36.0 - 46.0 % 37.6  40.0  41.5   Platelets 150.0 - 400.0 K/uL 209.0  205.0  236.0        Latest Ref Rng & Units 10/22/2020   11:00 AM 08/26/2019    9:20 AM 09/21/2018    8:35 AM  CMP  Glucose 70 - 99 mg/dL 80  97  102   BUN 6 - 23 mg/dL '18  14  14   '$ Creatinine 0.40 - 1.20 mg/dL 0.99  0.90  1.01   Sodium 135 - 145 mEq/L 143  139  139   Potassium 3.5 - 5.1 mEq/L 4.9  4.2  3.9   Chloride 96 - 112 mEq/L 107  103  102   CO2 19 - 32 mEq/L '30  27  28   '$ Calcium 8.4 - 10.5 mg/dL  9.6  9.6  9.9   Total Protein 6.0 - 8.3 g/dL 7.1  7.1  7.5   Total Bilirubin 0.2 - 1.2 mg/dL 0.5  0.5  0.6   Alkaline Phos 39 - 117 U/L '29  23  26   '$ AST 0 - 37 U/L '20  16  20   '$ ALT 0 - 35 U/L '16  12  16     '$ Colonoscopy 07/16/2014: 1. Sessile polyp was found at the cecum; polypectomy was performed with a cold snare 2. The examination was otherwise normal 10 year colonoscopy recall BENIGN POLYPOID COLONIC MUCOSA. NO ADENOMATOUS CHANGE OR MALIGNANCY.  Colonoscopy 03/30/2009: Diminutive polyp in the proximal transverse colon, removed and sent to pathology. Small internal and external hemorrhoids. Otherwise normal examination. 1. COLON, POLYP(S), TRANSVERSE : - TUBULAR ADENOMA.   - high grade dysplasia is not identified.  EGD 11/30/2017: - Normal examination. - Bravo 48 hour wireless pH probe was placed 6cm proximal to the GE junction.  EGD 11/13/2017: - The esophagus was normal. - The stomach was normal. - The examined duodenum  was normal.  Past Medical History:  Diagnosis Date   Arthritis    Dizziness    Hyperlipidemia    denies   Tubular adenoma of colon 03/30/09   Dr Sharlett Iles   Past Surgical History:  Procedure Laterality Date   APPENDECTOMY     with 1st pregnancy   BRAVO [place] STUDY N/A 11/30/2017   Procedure: BRAVO [place];  Surgeon: Milus Banister, MD;  Location: [facility];  Service: Endoscopy;  Laterality: N/A;   BREAST BIOPSY     needle aspiration X1; resection X 1   colonoscopy with polypectomy  03/30/2009   Dr Sharlett Iles   CT SCAN  2017   Chest due to a possible blood clot but came back normal   ESOPHAGOGASTRODUODENOSCOPY (EGD) WITH PROPOFOL N/A 11/30/2017   Procedure: ESOPHAGOGASTRODUODENOSCOPY (EGD) WITH PROPOFOL;  Surgeon: Milus Banister, MD;  Location: [facility];  Service: Endoscopy;  Laterality: N/A;  48 hour Ph probe   frozen shoulder Left    broke the cartilage-was put to sleep Dr. Joni Fears apox 2014   G 3 P 3     3 C sections   TUBAL  LIGATION     Social History: She is married.  Non-smoker.  No alcohol use.  No drug use.  Family History: Mother died from colon cancer at the age of 59.  Maternal aunts died from colon cancer at the ages of 21 and 68 father had heart disease and hyperlipidemia.  Paternal aunt had a stroke.  Paternal aunt had breast cancer.   No Known Allergies    Outpatient Encounter Medications as of 11/29/2021  Medication Sig   ALPRAZolam (XANAX) 0.25 MG tablet Take 1 tablet (0.25 mg total) by mouth 2 (two) times daily as needed for anxiety. (Patient not taking: Reported on 11/23/2021)   Ascorbic Acid (VITAMIN C PO) Take by mouth.   HYDROcodone-acetaminophen (NORCO) 5-325 MG tablet Take 1 tablet by mouth every 6 (six) hours as needed for moderate pain.   NASAL SPRAY SALINE NA Place into the nose.   NIACIN PO Take by mouth. (Patient not taking: Reported on 11/23/2021)   pseudoephedrine (SUDAFED) 30 MG tablet Take 30 mg by mouth every 4 (four) hours as needed for congestion.   No facility-administered encounter medications on file as of 11/29/2021.    REVIEW OF SYSTEMS:  Gen: Denies fever, sweats or chills. No weight loss.  CV: Denies chest pain, palpitations or edema. Resp: Denies cough, shortness of breath of hemoptysis.  GI: See HPI.   GU : Denies urinary burning, blood in urine, increased urinary frequency or incontinence. MS: Right shoulder pain.  Derm: Denies rash, itchiness, skin lesions or unhealing ulcers. Psych: Denies depression, anxiety or memory loss. Heme: Denies bruising, easy bleeding. Neuro:  + Dizziness if she gets up in the middle of the night to go to the bathroom.  Endo:  Denies any problems with DM, thyroid or adrenal function.  PHYSICAL EXAM: LMP 07/01/2013  BP (!) 140/86   Pulse 80   LMP 07/01/2013  BP (!) 140/86   Pulse 80   Ht '5\' 6"'$  (1.676 m)   Wt 139 lb 12.8 oz (63.4 kg)   LMP 07/01/2013   BMI 22.56 kg/m   General: 61 year old female in no acute distress. Head:  Normocephalic and atraumatic. Eyes:  Sclerae non-icteric, conjunctive pink. Ears: Normal auditory acuity. Mouth: Dentition intact. No ulcers or lesions.  Neck: Supple, no lymphadenopathy or thyromegaly.  Lungs: Clear bilaterally to auscultation without wheezes, crackles  or rhonchi. Heart: Regular rate and rhythm. No murmur, rub or gallop appreciated.  Abdomen: Soft, nontender, non distended. No masses. No hepatosplenomegaly. Normoactive bowel sounds x 4 quadrants.  Small umbilical hernia. Rectal: Deferred. Musculoskeletal: Symmetrical with no gross deformities. Skin: Warm and dry. No rash or lesions on visible extremities. Extremities: No edema. Neurological: Alert oriented x 4, no focal deficits.  Psychological:  Alert and cooperative. Normal mood and affect.  ASSESSMENT AND PLAN:  48) 61 year old female with a history of a tubular adenomatous polyp removed from the proximal transverse colon 02/2009. Her most recent colonoscopy was 07/16/2014 which identified one polyp which was removed from the cecum. Biopsies showed benign polypoid colonic mucosa. Her mother died from colon cancer at the age of 55 and 2 maternal aunts died from colon cancer at the ages of 17 and 58.   -Colonoscopy benefits and risks discussed including risk with sedation, risk of bleeding, perforation and infection  -Patient instructed to take MiraLAX nightly for 7 nights prior to colonoscopy prep date -Further recommendations to be determined after colonoscopy completed  2) Constipation, triggered by hydrocodone use for right shoulder pain -MiraLAX nightly as needed -Drink 8 glasses (64 ounces) of water daily   3) Chronic cough, improving. Normal EGD 10/2017.  Bravo pH testing off PPI/H2 therapy without evidence of pathologic acid reflux.      CC:  Binnie Rail, MD

## 2021-11-29 ENCOUNTER — Ambulatory Visit (INDEPENDENT_AMBULATORY_CARE_PROVIDER_SITE_OTHER): Payer: Commercial Managed Care - HMO | Admitting: Nurse Practitioner

## 2021-11-29 ENCOUNTER — Encounter: Payer: Self-pay | Admitting: Nurse Practitioner

## 2021-11-29 VITALS — BP 140/86 | HR 80 | Ht 66.0 in | Wt 139.8 lb

## 2021-11-29 DIAGNOSIS — K5900 Constipation, unspecified: Secondary | ICD-10-CM | POA: Diagnosis not present

## 2021-11-29 DIAGNOSIS — Z800 Family history of malignant neoplasm of digestive organs: Secondary | ICD-10-CM | POA: Diagnosis not present

## 2021-11-29 MED ORDER — SUTAB 1479-225-188 MG PO TABS: 1.0000 | ORAL_TABLET | Freq: Once | ORAL | 0 refills | Status: AC

## 2021-11-29 MED ORDER — POLYETHYLENE GLYCOL 3350 17 G PO PACK: 17.0000 g | PACK | Freq: Every day | ORAL | 0 refills | Status: DC

## 2021-11-29 NOTE — Telephone Encounter (Signed)
This was sent on Friday by Dr. Quay Burow

## 2021-11-29 NOTE — Patient Instructions (Addendum)
If you are age 62 or older, your body mass index should be between 23-30. Your There is no height or weight on file to calculate BMI. If this is out of the aforementioned range listed, please consider follow up with your Primary Care Provider.  If you are age 4 or younger, your body mass index should be between 19-25. Your There is no height or weight on file to calculate BMI. If this is out of the aformentioned range listed, please consider follow up with your Primary Care Provider.   ________________________________________________________   Jean Pittman have been scheduled for a colonoscopy. Please follow written instructions given to you at your visit today.  Please pick up your prep supplies at the pharmacy within the next 1-3 days. If you use inhalers (even only as needed), please bring them with you on the day of your procedure.   Take Miralax daily at bedtime as needed.  Also, please take Miralax daily for 7 nights prior to colonoscopy prep date.  Thank you for entrusting me with your care and for choosing [place], [place]

## 2021-11-30 ENCOUNTER — Encounter: Payer: Self-pay | Admitting: Orthopaedic Surgery

## 2021-11-30 ENCOUNTER — Ambulatory Visit (INDEPENDENT_AMBULATORY_CARE_PROVIDER_SITE_OTHER): Payer: Commercial Managed Care - HMO

## 2021-11-30 ENCOUNTER — Ambulatory Visit (INDEPENDENT_AMBULATORY_CARE_PROVIDER_SITE_OTHER): Payer: Commercial Managed Care - HMO | Admitting: Orthopaedic Surgery

## 2021-11-30 DIAGNOSIS — M25511 Pain in right shoulder: Secondary | ICD-10-CM

## 2021-11-30 DIAGNOSIS — M542 Cervicalgia: Secondary | ICD-10-CM | POA: Diagnosis not present

## 2021-11-30 MED ORDER — HYDROCODONE-ACETAMINOPHEN 5-325 MG PO TABS: 1.0000 | ORAL_TABLET | Freq: Four times a day (QID) | ORAL | 0 refills | Status: DC | PRN

## 2021-11-30 MED ORDER — BUPIVACAINE HCL 0.25 % IJ SOLN
2.0000 mL | INTRAMUSCULAR | Status: AC | PRN
  Administered 2021-11-30: 2 mL via INTRA_ARTICULAR

## 2021-11-30 MED ORDER — LIDOCAINE HCL 2 % IJ SOLN
2.0000 mL | INTRAMUSCULAR | Status: AC | PRN
  Administered 2021-11-30: 2 mL

## 2021-11-30 MED ORDER — METHYLPREDNISOLONE ACETATE 40 MG/ML IJ SUSP
80.0000 mg | INTRAMUSCULAR | Status: AC | PRN
  Administered 2021-11-30: 80 mg via INTRA_ARTICULAR

## 2021-11-30 NOTE — Progress Notes (Signed)
Office Visit Note   Patient: Jean Pittman Pittman           Date of Birth: 07/28/1960           MRN: [id number] Visit Date: 11/30/2021              Requested by: Binnie Rail, MD [place],  [place] [address] PCP: Binnie Rail, MD   Assessment & Plan: Visit Diagnoses:  1. Neck pain   2. Acute pain of right shoulder     Plan: Jean Pittman Pittman has been experiencing pain in the area of the right shoulder for several weeks with the more recent exacerbation.  She denies any history of injury or trauma but she does work out at [employer].  She was initially seen by her primary care physician with x-rays of her shoulder.  I reviewed these and did not see any particular abnormality.  She has been on hydrocodone with little if any relief and also tramadol which gave her no relief.  She has had a "shot of Toradol which really helped for short time.  She describes the pain as being burning in the area of her right scapula and then also pain along the anterior aspect of her right shoulder radiating into the arm and even as far distally as the forearm films of her cervical spine demonstrated degenerative changes but she had  no loss of motion or pain with motion.  She certainly has a number of trigger point areas of tenderness about her right scapula and she has positive impingement.  I injected the subacromial space with cortisone and she had significant relief of her shoulder and arm pain but still having some burning in the area of her scapula.  She certainly could have a combination of issues including just a problem with her shoulder and may be even fibromyalgia.  We will renew her prescription for hydrocodone and ask her to check with [place] in the next week to 10 days and if still having pain in the area of her scapula might be worthwhile trying a Medrol Dosepak.  Follow-Up Instructions: Return in about 2 weeks (around 12/14/2021).   Orders:  Orders Placed This Encounter  Procedures   XR  Cervical Spine 2 or 3 views   Meds ordered this encounter  Medications   HYDROcodone-acetaminophen (NORCO) 5-325 MG tablet    Sig: Take 1 tablet by mouth every 6 (six) hours as needed for moderate pain.    Dispense:  15 tablet    Refill:  0      Procedures: Large Joint Inj: R subacromial bursa on 11/30/2021 4:45 PM Indications: pain and diagnostic evaluation Details: 25 G 1.5 in needle, anterolateral approach  Arthrogram: No  Medications: 2 mL lidocaine 2 %; 80 mg methylPREDNISolone acetate 40 MG/ML; 2 mL bupivacaine 0.25 % Consent was given by the patient. Immediately prior to procedure a time out was called to verify the correct patient, procedure, equipment, support staff and site/side marked as required. Patient was prepped and draped in the usual sterile fashion.       Clinical Data: No additional findings.   Subjective: Chief Complaint  Patient presents with   Right Arm - Pain  3 to 4-week history of pain in the area of the right shoulder with a more recent exacerbation prompting a visit to her primary care physician.  Films of her shoulder were nondiagnostic.  She was given an injection of Toradol which initially helped and  then tried tramadol which did not help and then more recently hydrocodone.  She denies any problem with her cervical spine but is having a lot of burning in the area of her right scapula and pain in the right shoulder region referred into the right arm.  She is really not had any numbness or tingling no history of injury or trauma  HPI  Review of Systems   Objective: Vital Signs: LMP 07/01/2013   Physical Exam Constitutional:      Appearance: She is well-developed.  Eyes:     Pupils: Pupils are equal, round, and reactive to light.  Pulmonary:     Effort: Pulmonary effort is normal.  Skin:    General: Skin is warm and dry.  Neurological:     Mental Status: She is alert and oriented to person, place, and time.  Psychiatric:         Behavior: Behavior normal.     Ortho Exam awake alert and oriented x3 comfortable sitting.  No acute distress.  Positive impingement and empty can testing.  Negative speeds sign.  Biceps intact.  Skin intact.  Good grip and release and good strength with internal/external rotation.  There were some areas of tenderness about the anterior lateral deltoid.  Full range of motion of the cervical spine without any discomfort or referred pain.  Multiple areas of trigger point tenderness about the right scapula and parascapular musculature.  Specialty Comments:  No specialty comments available.  Imaging: XR Cervical Spine 2 or 3 views  Result Date: 11/30/2021 Films of the cervical spine obtained in 2 projections.  There is straightening of the normal cervical lordosis.  There are degenerative changes at the disc spaces at C5-6 and C6-7.  There is some mild facet sclerosis at the same levels.  No listhesis or scoliosis.  No acute changes    PMFS History: Patient Active Problem List   Diagnosis Date Noted   Acute pain of right shoulder 11/23/2021   Decreased hearing of right ear 01/19/2021   Acute non-recurrent sinusitis 01/19/2021   Hyperglycemia 09/20/2018   Upper airway cough syndrome 01/10/2017   Chronic cough 03/22/2016   EKG abnormalities 02/15/2012   ADENOMATOUS COLONIC POLYP 04/16/2010   REACTIVE AIRWAY DISEASE 04/16/2010   Hyperlipidemia 03/12/2009   Past Medical History:  Diagnosis Date   Arthritis    Dizziness    Hyperlipidemia    denies   Tubular adenoma of colon 03/30/09   Dr Sharlett Iles    Family History  Problem Relation Age of Onset   Cancer - Colon Mother    Heart disease Father    Hyperlipidemia Father    Stroke Maternal Aunt        ? . one M aunt;> 65   Colon cancer Maternal Aunt    Colon cancer Maternal Aunt    Breast cancer Paternal Aunt    Diabetes Paternal Aunt     Past Surgical History:  Procedure Laterality Date   APPENDECTOMY     with 1st pregnancy    BRAVO [place] STUDY N/A 11/30/2017   Procedure: BRAVO [place] STUDY;  Surgeon: Milus Banister, MD;  Location: [facility];  Service: Endoscopy;  Laterality: N/A;   BREAST BIOPSY     needle aspiration X1; resection X 1   colonoscopy with polypectomy  03/30/2009   Dr Sharlett Iles   CT SCAN  2017   Chest due to a possible blood clot but came back normal   ESOPHAGOGASTRODUODENOSCOPY (EGD) WITH PROPOFOL N/A 11/30/2017  Procedure: ESOPHAGOGASTRODUODENOSCOPY (EGD) WITH PROPOFOL;  Surgeon: Milus Banister, MD;  Location: [facility];  Service: Endoscopy;  Laterality: N/A;  48 hour Ph probe   frozen shoulder Left    broke the cartilage-was put to sleep Dr. Joni Fears apox 2014   G 3 P 3     3 C sections   TUBAL LIGATION     Social History   Occupational History   Not on file  Tobacco Use   Smoking status: Never   Smokeless tobacco: Never  Vaping Use   Vaping Use: Never used  Substance and Sexual Activity   Alcohol use: No   Drug use: No   Sexual activity: Not Currently    Partners: Male    Birth control/protection: Post-menopausal    Comment: 1st intercourse- 29, partners- 2, married- 21 yrs      Garald Balding, MD   Note - This record has been created using [employer].  Chart creation errors have been sought, but may not always  have been located. Such creation errors do not reflect on  the standard of medical care.

## 2021-12-02 NOTE — Progress Notes (Signed)
Addendum: Reviewed and agree with assessment and management plan. Serita Degroote M, MD  

## 2021-12-03 ENCOUNTER — Telehealth: Payer: Self-pay | Admitting: Orthopaedic Surgery

## 2021-12-03 NOTE — Telephone Encounter (Signed)
Pt called and said whitfield wanted her to let him know how injection helped. PT states that the injection did help with the burning pain that goes down but she still has the pain. She states she wears a wrist brace also helps with the burning feeling. She states there is also soreness in the in injection area. She is only taking pain medicine now once every 17 hours.   She is wanting to know if prednisone is gonna be sent in  Cb [phone]

## 2021-12-05 ENCOUNTER — Encounter: Payer: Self-pay | Admitting: Orthopaedic Surgery

## 2021-12-06 ENCOUNTER — Ambulatory Visit (AMBULATORY_SURGERY_CENTER): Payer: Commercial Managed Care - HMO | Admitting: Internal Medicine

## 2021-12-06 ENCOUNTER — Encounter: Payer: Self-pay | Admitting: Internal Medicine

## 2021-12-06 ENCOUNTER — Other Ambulatory Visit: Payer: Self-pay | Admitting: Physician Assistant

## 2021-12-06 VITALS — BP 127/77 | HR 68 | Temp 97.3°F | Resp 10 | Ht 66.0 in | Wt 139.0 lb

## 2021-12-06 DIAGNOSIS — D122 Benign neoplasm of ascending colon: Secondary | ICD-10-CM

## 2021-12-06 DIAGNOSIS — Z09 Encounter for follow-up examination after completed treatment for conditions other than malignant neoplasm: Secondary | ICD-10-CM

## 2021-12-06 DIAGNOSIS — Z86010 Personal history of colonic polyps: Secondary | ICD-10-CM | POA: Diagnosis not present

## 2021-12-06 DIAGNOSIS — Z800 Family history of malignant neoplasm of digestive organs: Secondary | ICD-10-CM

## 2021-12-06 MED ORDER — METHYLPREDNISOLONE 4 MG PO TBPK: ORAL_TABLET | ORAL | 0 refills | Status: DC

## 2021-12-06 MED ORDER — SODIUM CHLORIDE 0.9 % IV SOLN: 500.0000 mL | Freq: Once | INTRAVENOUS | Status: DC

## 2021-12-06 NOTE — Progress Notes (Signed)
Called to room to assist during endoscopic procedure.  Patient ID and intended procedure confirmed with present staff. Received instructions for my participation in the procedure from the performing physician.  

## 2021-12-06 NOTE — Progress Notes (Signed)
A and O x3. Report to RN. Tolerated MAC anesthesia well. 

## 2021-12-06 NOTE — Progress Notes (Signed)
Pt.'s mother (at age 61) died in 27-Aug-2022 of colon cancer.

## 2021-12-06 NOTE — Patient Instructions (Signed)
Await pathology results.  Handout on polyps provided.  YOU HAD AN ENDOSCOPIC PROCEDURE TODAY AT THE [facility]:   Refer to the procedure report that was given to you for any specific questions about what was found during the examination.  If the procedure report does not answer your questions, please call your gastroenterologist to clarify.  If you requested that your care partner not be given the details of your procedure findings, then the procedure report has been included in a sealed envelope for you to review at your convenience later.  YOU SHOULD EXPECT: Some feelings of bloating in the abdomen. Passage of more gas than usual.  Walking can help get rid of the air that was put into your GI tract during the procedure and reduce the bloating. If you had a lower endoscopy (such as a colonoscopy or flexible sigmoidoscopy) you may notice spotting of blood in your stool or on the toilet paper. If you underwent a bowel prep for your procedure, you may not have a normal bowel movement for a few days.  Please Note:  You might notice some irritation and congestion in your nose or some drainage.  This is from the oxygen used during your procedure.  There is no need for concern and it should clear up in a day or so.  SYMPTOMS TO REPORT IMMEDIATELY:  Following lower endoscopy (colonoscopy or flexible sigmoidoscopy):  Excessive amounts of blood in the stool  Significant tenderness or worsening of abdominal pains  Swelling of the abdomen that is new, acute  Fever of 100F or higher   For urgent or emergent issues, a gastroenterologist can be reached at any hour by calling [phone]. Do not use MyChart messaging for urgent concerns.    DIET:  We do recommend a small meal at first, but then you may proceed to your regular diet.  Drink plenty of fluids but you should avoid alcoholic beverages for 24 hours.  ACTIVITY:  You should plan to take it easy for the rest of today and you should  NOT DRIVE or use heavy machinery until tomorrow (because of the sedation medicines used during the test).    FOLLOW UP: Our staff will call the number listed on your records the next business day following your procedure.  We will call around 7:15- 8:00 am to check on you and address any questions or concerns that you may have regarding the information given to you following your procedure. If we do not reach you, we will leave a message.     If any biopsies were taken you will be contacted by phone or by letter within the next 1-3 weeks.  Please call us at [phone] if you have not heard about the biopsies in 3 weeks.    SIGNATURES/CONFIDENTIALITY: You and/or your care partner have signed paperwork which will be entered into your electronic medical record.  These signatures attest to the fact that that the information above on your After Visit Summary has been reviewed and is understood.  Full responsibility of the confidentiality of this discharge information lies with you and/or your care-partner.  

## 2021-12-06 NOTE — Op Note (Signed)
[place] Patient Name: Jean Pittman Procedure Date: 12/06/2021 11:53 AM MRN: [id number] Endoscopist: Jerene Bears , MD Age: 61 Referring MD:  Date of Birth: 12/02/1960 Gender: Female Account #: [id number] Procedure:                Colonoscopy Indications:              High risk colon cancer surveillance: Personal                            history of non-advanced adenoma, Family history of                            colon cancer in a first-degree relative after age                            35 years (mother) and 2 maternal aunts, Last                            colonoscopy: May 2016 Medicines:                Monitored Anesthesia Care Procedure:                Pre-Anesthesia Assessment:                           - Prior to the procedure, a History and Physical                            was performed, and patient medications and                            allergies were reviewed. The patient's tolerance of                            previous anesthesia was also reviewed. The risks                            and benefits of the procedure and the sedation                            options and risks were discussed with the patient.                            All questions were answered, and informed consent                            was obtained. Prior Anticoagulants: The patient has                            taken no previous anticoagulant or antiplatelet                            agents. ASA Grade Assessment: II - A patient with  mild systemic disease. After reviewing the risks                            and benefits, the patient was deemed in                            satisfactory condition to undergo the procedure.                           After obtaining informed consent, the colonoscope                            was passed under direct vision. Throughout the                            procedure, the patient's blood pressure, pulse, and                             oxygen saturations were monitored continuously. The                            PCF-HQ190L Colonoscope was introduced through the                            anus and advanced to the cecum, identified by                            appendiceal orifice and ileocecal valve. The                            colonoscopy was performed without difficulty. The                            patient tolerated the procedure well. The quality                            of the bowel preparation was good. The ileocecal                            valve, appendiceal orifice, and rectum were                            photographed. Scope In: 12:01:45 PM Scope Out: 12:16:33 PM Scope Withdrawal Time: 0 hours 10 minutes 12 seconds  Total Procedure Duration: 0 hours 14 minutes 48 seconds  Findings:                 The digital rectal exam was normal.                           A 6 mm polyp was found in the ascending colon. The                            polyp was flat. The polyp was removed with a cold  snare. Resection and retrieval were complete.                           A 2 mm polyp was found in the ascending colon. The                            polyp was sessile. The polyp was removed with a                            cold snare. Resection and retrieval were complete.                           Anal papilla(e) were hypertrophied on retroflexed                            views of the rectum. Complications:            No immediate complications. Estimated Blood Loss:     Estimated blood loss was minimal. Impression:               - One 6 mm polyp in the ascending colon, removed                            with a cold snare. Resected and retrieved.                           - One 2 mm polyp in the ascending colon, removed                            with a cold snare. Resected and retrieved.                           - Anal papillae were hypertrophied with  retroflexed                            views in rectum otherwise normal. Recommendation:           - Patient has a contact number available for                            emergencies. The signs and symptoms of potential                            delayed complications were discussed with the                            patient. Return to normal activities tomorrow.                            Written discharge instructions were provided to the                            patient.                           -  Resume previous diet.                           - Continue present medications.                           - Await pathology results.                           - Repeat colonoscopy is recommended for                            surveillance. The colonoscopy date will be                            determined after pathology results from today's                            exam become available for review. Jerene Bears, MD 12/06/2021 12:22:10 PM This report has been signed electronically.

## 2021-12-06 NOTE — Progress Notes (Signed)
See office note dated November 29, 2021 for details and current H&P  Patient presenting for colonoscopy due to family history of colon cancer in her mother and 2 maternal aunts  Last colonoscopy 2016 with Dr. Ardis Hughs  She remains appropriate for colonoscopy in the [facility] today

## 2021-12-07 ENCOUNTER — Telehealth: Payer: Self-pay

## 2021-12-07 NOTE — Telephone Encounter (Signed)
  Follow up Call-     12/06/2021   11:20 AM  Call back number  Post procedure Call Back phone  # [phone]  Permission to leave phone message Yes     Patient questions:  Do you have a fever, pain , or abdominal swelling? No. Pain Score  0 *  Have you tolerated food without any problems? Yes.    Have you been able to return to your normal activities? Yes.    Do you have any questions about your discharge instructions: Diet   No. Medications  No. Follow up visit  No.  Do you have questions or concerns about your Care? No.  Actions: * If pain score is 4 or above: No action needed, pain <4.

## 2021-12-08 ENCOUNTER — Other Ambulatory Visit: Payer: Self-pay | Admitting: Physician Assistant

## 2021-12-08 NOTE — Telephone Encounter (Signed)
Please call and discuss present pain and treatment options

## 2021-12-09 NOTE — Telephone Encounter (Signed)
Thanks

## 2021-12-13 ENCOUNTER — Encounter: Payer: Self-pay | Admitting: Internal Medicine

## 2021-12-13 ENCOUNTER — Other Ambulatory Visit: Payer: Self-pay | Admitting: Physician Assistant

## 2021-12-13 MED ORDER — MELOXICAM 15 MG PO TABS: 15.0000 mg | ORAL_TABLET | Freq: Every day | ORAL | 0 refills | Status: DC

## 2022-01-05 ENCOUNTER — Other Ambulatory Visit: Payer: Self-pay | Admitting: Obstetrics & Gynecology

## 2022-01-05 ENCOUNTER — Encounter: Payer: Self-pay | Admitting: Orthopaedic Surgery

## 2022-01-05 ENCOUNTER — Ambulatory Visit (INDEPENDENT_AMBULATORY_CARE_PROVIDER_SITE_OTHER): Payer: Commercial Managed Care - HMO | Admitting: Orthopaedic Surgery

## 2022-01-05 ENCOUNTER — Ambulatory Visit (INDEPENDENT_AMBULATORY_CARE_PROVIDER_SITE_OTHER): Payer: Commercial Managed Care - HMO

## 2022-01-05 DIAGNOSIS — M85851 Other specified disorders of bone density and structure, right thigh: Secondary | ICD-10-CM

## 2022-01-05 DIAGNOSIS — Z13820 Encounter for screening for osteoporosis: Secondary | ICD-10-CM | POA: Diagnosis not present

## 2022-01-05 DIAGNOSIS — M85852 Other specified disorders of bone density and structure, left thigh: Secondary | ICD-10-CM

## 2022-01-05 DIAGNOSIS — M25511 Pain in right shoulder: Secondary | ICD-10-CM | POA: Diagnosis not present

## 2022-01-05 DIAGNOSIS — Z780 Asymptomatic menopausal state: Secondary | ICD-10-CM

## 2022-01-05 DIAGNOSIS — Z01419 Encounter for gynecological examination (general) (routine) without abnormal findings: Secondary | ICD-10-CM

## 2022-01-05 NOTE — Progress Notes (Signed)
Office Visit Note   Patient: Jean Pittman           Date of Birth: 16-May-1960           MRN: [id number] Visit Date: 01/05/2022              Requested by: Binnie Rail, MD [place],  [place] [address] PCP: Binnie Rail, MD   Assessment & Plan: Visit Diagnoses:  1. Acute pain of right shoulder     Plan: [name] has been followed recently for problems referable to both of her upper extremities and, more specifically pain in the area of her left shoulder and scapula.  Films of her shoulder were negative.  She had been placed on hydrocodone with little if any relief and tramadol with minimal relief of her symptoms.  She did have a shot of Toradol by her primary care physician that seem to make a difference.  I placed her on a Medrol Dosepak and she notes that that helped.  She is also been on meloxicam which she believes has also significantly reduced her discomfort.  She presently notes that she is not having any pain but is just a little occasional discomfort with both of her shoulders.  She was working out at [employer] and there may have been some relation between that and working with [employer].  She is not having the areas of tenderness about the the left scapula and does not have any impingement.  Neurologically intact and no neck pain.  Fortunately, she is much better.  I think physical therapy would be helpful in terms of demonstrating the appropriate mechanics of weight lifting.  She can follow-up with working out at [employer].  She has had some numbness and tingling in her hands and I would suspect she has some early carpal tunnel.  She has a splint that seems to make a difference at night.  I have suggested she might want to consider EMGs and nerve conduction studies but she said she would rather wait and call me if she would like to proceed.  Also suggested we might want to consider an MRI scan of her right shoulder if she continues to have a problem particularly  when she stops the meloxicam.  However, significantly improved and not having any "pain".  We will also consider renewing meloxicam depending upon her symptoms  Follow-Up Instructions: Return if symptoms worsen or fail to improve.   Orders:  Orders Placed This Encounter  Procedures   Ambulatory referral to Physical Therapy   No orders of the defined types were placed in this encounter.     Procedures: No procedures performed   Clinical Data: No additional findings.   Subjective: Chief Complaint  Patient presents with   Neck - Follow-up   Right Shoulder - Follow-up  Patient presents today for a one month follow up on her neck and right shoulder. She received a cortisone injection into her shoulder at the last visit on 11/30/2021. She said that there has been a lot of improvement, but still uncomfortable. She is taking an antiinflammatory called Meloxicam for pain relief, as prescribed by Bevely Palmer Persons PA-C.   HPI  Review of Systems   Objective: Vital Signs: LMP 07/01/2013   Physical Exam Constitutional:      Appearance: She is well-developed.  Eyes:     Pupils: Pupils are equal, round, and reactive to light.  Pulmonary:     Effort: Pulmonary  effort is normal.  Skin:    General: Skin is warm and dry.  Neurological:     Mental Status: She is alert and oriented to person, place, and time.  Psychiatric:        Behavior: Behavior normal.     Ortho Exam awake alert and oriented x3 comfortable sitting and in no acute distress.  There were no areas of tenderness or trigger point areas of tenderness about the right scapula where there were before.  Painless range of motion of cervical spine.  She was able to easily place both shoulders well overhead with negative impingement empty can testing.  Few areas of mild subacromial tenderness on the right side but none on the left.  Good grip and release.  Biceps intact.  She did have minimally positive Phalen's on the right but  not the left and negative Tinel's over both wrists.  Neurologically intact.  Good capillary refill  Specialty Comments:  No specialty comments available.  Imaging: No results found.   PMFS History: Patient Active Problem List   Diagnosis Date Noted   Acute pain of right shoulder 11/23/2021   Decreased hearing of right ear 01/19/2021   Acute non-recurrent sinusitis 01/19/2021   Hyperglycemia 09/20/2018   Upper airway cough syndrome 01/10/2017   Chronic cough 03/22/2016   EKG abnormalities 02/15/2012   ADENOMATOUS COLONIC POLYP 04/16/2010   REACTIVE AIRWAY DISEASE 04/16/2010   Hyperlipidemia 03/12/2009   Past Medical History:  Diagnosis Date   Arthritis    Dizziness    Hyperlipidemia    denies   Tubular adenoma of colon 03/30/09   Dr Sharlett Iles    Family History  Problem Relation Age of Onset   Colon cancer Mother    Cancer - Colon Mother    Heart disease Father    Hyperlipidemia Father    Stroke Maternal Aunt        ? . one M aunt;> 65   Colon cancer Maternal Aunt    Colon cancer Maternal Aunt    Breast cancer Paternal Aunt    Diabetes Paternal Aunt    Esophageal cancer Neg Hx    Stomach cancer Neg Hx    Rectal cancer Neg Hx     Past Surgical History:  Procedure Laterality Date   APPENDECTOMY     with 1st pregnancy   BRAVO [place] STUDY N/A 11/30/2017   Procedure: BRAVO [place] STUDY;  Surgeon: Milus Banister, MD;  Location: [facility];  Service: Endoscopy;  Laterality: N/A;   BREAST BIOPSY     needle aspiration X1; resection X 1   colonoscopy with polypectomy  03/30/2009   Dr Sharlett Iles   CT SCAN  2017   Chest due to a possible blood clot but came back normal   ESOPHAGOGASTRODUODENOSCOPY (EGD) WITH PROPOFOL N/A 11/30/2017   Procedure: ESOPHAGOGASTRODUODENOSCOPY (EGD) WITH PROPOFOL;  Surgeon: Milus Banister, MD;  Location: [facility];  Service: Endoscopy;  Laterality: N/A;  48 hour Ph probe   frozen shoulder Left    broke the cartilage-was put to sleep Dr. Joni Fears apox 2014   G 3 P 3     3 C sections   TUBAL LIGATION     Social History   Occupational History   Not on file  Tobacco Use   Smoking status: Never   Smokeless tobacco: Never  Vaping Use   Vaping Use: Never used  Substance and Sexual Activity   Alcohol use: No   Drug use: No  Sexual activity: Not Currently    Partners: Male    Birth control/protection: Post-menopausal    Comment: 1st intercourse- 66, partners- 63, married- 10 yrs

## 2022-01-09 ENCOUNTER — Other Ambulatory Visit: Payer: Self-pay | Admitting: Physician Assistant

## 2022-01-17 NOTE — Therapy (Signed)
OUTPATIENT PHYSICAL THERAPY SHOULDER EVALUATION   Patient Name: Jean Pittman MRN: [id number] DOB:January 22, 1961, 61 y.o., female Today's Date: 01/18/2022  END OF SESSION:  PT End of Session - 01/18/22 1057     Visit Number 1    Number of Visits 13    Date for PT Re-Evaluation 03/15/22    Authorization Type [employer]    Authorization - Visit Number 1    Authorization - Number of Visits 30   combined PT/OT/chiro   PT Start Time 1100    PT Stop Time 1149    PT Time Calculation (min) 49 min    Activity Tolerance Patient tolerated treatment well    Behavior During Therapy [facility] for tasks assessed/performed             Past Medical History:  Diagnosis Date   Arthritis    Dizziness    Hyperlipidemia    denies   Tubular adenoma of colon 03/30/09   Dr Sharlett Iles   Past Surgical History:  Procedure Laterality Date   APPENDECTOMY     with 1st pregnancy   BRAVO Ronald STUDY N/A 11/30/2017   Procedure: BRAVO [place];  Surgeon: Milus Banister, MD;  Location: [facility];  Service: Endoscopy;  Laterality: N/A;   BREAST BIOPSY     needle aspiration X1; resection X 1   colonoscopy with polypectomy  03/30/2009   Dr Sharlett Iles   CT SCAN  2017   Chest due to a possible blood clot but came back normal   ESOPHAGOGASTRODUODENOSCOPY (EGD) WITH PROPOFOL N/A 11/30/2017   Procedure: ESOPHAGOGASTRODUODENOSCOPY (EGD) WITH PROPOFOL;  Surgeon: Milus Banister, MD;  Location: [facility];  Service: Endoscopy;  Laterality: N/A;  48 hour Ph probe   frozen shoulder Left    broke the cartilage-was put to sleep Dr. Joni Fears apox 2014   G 3 P 3     3 C sections   TUBAL LIGATION     Patient Active Problem List   Diagnosis Date Noted   Acute pain of right shoulder 11/23/2021   Decreased hearing of right ear 01/19/2021   Acute non-recurrent sinusitis 01/19/2021   Hyperglycemia 09/20/2018   Upper airway cough syndrome 01/10/2017   Chronic cough 03/22/2016   EKG abnormalities 02/15/2012    ADENOMATOUS COLONIC POLYP 04/16/2010   REACTIVE AIRWAY DISEASE 04/16/2010   Hyperlipidemia 03/12/2009    PCP: Binnie Rail, MD  REFERRING PROVIDER: Garald Balding, MD  REFERRING DIAG: M25.511 (ICD-10-CM) - Acute pain of right shoulder  THERAPY DIAG:  Right shoulder pain, unspecified chronicity  Left shoulder pain, unspecified chronicity  Muscle weakness (generalized)  Stiffness of right shoulder, not elsewhere classified  Rationale for Evaluation and Treatment: Rehabilitation  ONSET DATE: months/years, gradual onset but exacerbated since september  SUBJECTIVE:  SUBJECTIVE STATEMENT: Pain in lateral shoulder into upper arms, B but R more than L. Pt states this has been on ongoing issue but in September of this year she began to have burning pain in shoulder blade and upper arm. Got a massage that evening and saw MD next day. Received medication, ended up getting steroid shot in RUE with good relief. Typically exercises 3 days a week, had to take time off due to pain but is back to most of activities although is lifting lighter weights. Continues to have increased pain with activity.  Reports having carpal tunnel, numbness in hands at night, brace helps.  Denies headaches. Reports having had night sweats since starting mobic and feels they may be related (advised to discuss with MD).    PERTINENT HISTORY: States she recently had bone scan and showed osteopenia (neck and L hip per pt)  PAIN:  Are you having pain: no pain at present Location: R scapula, lateral shoulder; L side anterior shoulder into bicep How would you describe your pain? discomfort Best in past week: 0/10 Worst in past week: 5/10 Aggravating factors: exercise, lifting overhead, heavy lifting/carrying Easing factors: medication,  rest  PRECAUTIONS: None  WEIGHT BEARING RESTRICTIONS: No  FALLS:  Has patient fallen in last 6 months? No  LIVING ENVIRONMENT: Lives w/ spouse, 2 story, main level livable but has office and craft room upstairs. No trouble with stair navigation.   OCCUPATION: Self employed - owns company, works from home. Enjoys gardening. Very active  PLOF: Independent  PATIENT GOALS: reduce discomfort, get arms working more normally   NEXT MD VISIT: no scheduled follow up per pt   OBJECTIVE:   DIAGNOSTIC FINDINGS:  X rays unremarkable per referring MD notes and per pt   PATIENT SURVEYS:  FOTO: 71% eval, 75% predicted  COGNITION: Overall cognitive status: Within functional limits for tasks assessed     SENSATION: Light touch intact B UE and LE, aside from subjective increase in sensitivity R C5 compared to L  POSTURE: Grossly WNL  UPPER EXTREMITY ROM:  Active ROM Right eval Left eval  Shoulder flexion 165 162  Shoulder abduction 160 155  Shoulder internal rotation    Shoulder external rotation    Elbow flexion    Elbow extension    Wrist flexion    Wrist extension     (Blank rows = not tested) Comments: more pain with lowering B from flexion  UPPER EXTREMITY MMT:  MMT Right eval Left eval  Shoulder flexion 4 4p!  Shoulder extension    Shoulder abduction 4p! 4p!  Shoulder extension    Shoulder internal rotation 4+ 5  Shoulder external rotation 4- 3+  Elbow flexion 5 (R shoulder discomfort) 5  Elbow extension 5 5 (L triceps discomfort)  Grip strength     (Blank rows = not tested)   PALPATION:  Concordant pain with trigger points in R supraspinatus, infraspinatus, levator scap, and proximal biceps. Supraspinatus trigger point refers down arm L side: most tenderness in infraspinatus and deltoid   TODAY'S TREATMENT:  [facility] Adult PT  Treatment:                                                DATE: 01/18/22 Therapeutic Exercise: Scapular retraction x10 cues for HEP Shoulder ER isometric x5 B UE cues for HEP   PATIENT EDUCATION: Education details: Pt education on PT impairments, prognosis, and POC. Informed consent. Rationale for interventions, safe/appropriate HEP performance Person educated: Patient Education method: Explanation, Demonstration, Tactile cues, Verbal cues, and Handouts Education comprehension: verbalized understanding, returned demonstration, verbal cues required, tactile cues required, and needs further education    HOME EXERCISE PROGRAM: Access Code: [email or web address] URL: [email or web address] Date: 01/18/2022 Prepared by: Enis Slipper  Exercises - Standing Isometric Shoulder External Rotation with Doorway  - 1 x daily - 7 x weekly - 3 sets - 10 reps - Seated Scapular Retraction  - 1 x daily - 7 x weekly - 3 sets - 5 reps  ASSESSMENT:  CLINICAL IMPRESSION: Patient is a 61 y.o. woman who was seen today for physical therapy evaluation and treatment for B shoulder pain, R>L, both improved after steroid injection and initiation of mobic. Pt reports limitations in exercise activities and difficulty performing housework/yardwork due to symptoms, most difficulty with reaching overhead and lifting items. Today pt demonstrates mild limitations in [facility] mobility B but more pain with eccentric lowering from elevated position, concordant tenderness with trigger points in periscapular/GH musculature as above. With MMT testing pt demonstrates weakness throughout [facility] musculature which is likely contributing to her difficulty with typical activity, as she describes herself as being quite active. Pt tolerates HEP well, more difficulty with isometrics on L rather than R. Pt verbalizes good understanding and demos good performance of HEP. No adverse events, pt verbalizes agreement w/ POC. Pt departs today's session in no  acute distress, all voiced questions/concerns addressed appropriately from PT perspective.    OBJECTIVE IMPAIRMENTS: decreased activity tolerance, decreased mobility, decreased ROM, decreased strength, impaired sensation, impaired UE functional use, and pain.   ACTIVITY LIMITATIONS: carrying, lifting, sleeping, reach over head, and hygiene/grooming  PARTICIPATION LIMITATIONS: meal prep, cleaning, laundry, driving, shopping, community activity, occupation, and yard work  PERSONAL FACTORS: Time since onset of injury/illness/exacerbation are also affecting patient's functional outcome.   REHAB POTENTIAL: Good  CLINICAL DECISION MAKING: Stable/uncomplicated  EVALUATION COMPLEXITY: Low   GOALS: Goals reviewed with patient? No  SHORT TERM GOALS: Target date: 02/15/2022    Pt will demonstrate appropriate understanding and performance of initially prescribed HEP in order to facilitate improved independence with management of symptoms.  Baseline: HEP provided on eval Goal status: INITIAL   2. Pt will score greater than or equal to 73 on FOTO in order to demonstrate improved perception of function due to symptoms.  Baseline: 71  Goal status: INITIAL   LONG TERM GOALS: Target date: 03/15/2022      Pt will score 75 on FOTO in order to demonstrate improved perception of function due to symptoms. Baseline: 71 Goal status: INITIAL  2.  Pt will demonstrate independence with final HEP to facilitate improved long term management of symptoms. Baseline: initial HEP prescribed Goal status: INITIAL  3.  Pt will demonstrate at least 4/5 and grossly symmetrical shoulder MMT B for improved symmetry of UE strength and improved tolerance to functional movements.  Baseline: see MMT chart above Goal status: INITIAL  4. Pt will report/demonstrate  ability to perform typical household activities with less than 2 point increase in pain on NPS in order to indicative improved tolerance/independence for  functional tasks.   Baseline: up to 5/10 on NPS with household activities  Goal status: INITIAL   5. Pt will demonstrate grossly WFL shoulder elevation B with less than 2/10 pain on NPS for improved tolerance to overhead tasks.  Baseline: pain with lowering from elevation B, see ROM chart above  Goal status: initial  PLAN:  PT FREQUENCY: 2x/week  PT DURATION: 6 weeks  PLANNED INTERVENTIONS: Therapeutic exercises, Therapeutic activity, Neuromuscular re-education, Balance training, Gait training, Patient/Family education, Self Care, Joint mobilization, Dry Needling, Electrical stimulation, Spinal mobilization, Cryotherapy, Moist heat, Taping, Manual therapy, and Re-evaluation  PLAN FOR NEXT SESSION: Progress ROM/strengthening exercises as able/appropriate, review HEP.    Leeroy Cha PT, DPT 01/18/2022 12:02 PM

## 2022-01-18 ENCOUNTER — Other Ambulatory Visit: Payer: Self-pay

## 2022-01-18 ENCOUNTER — Encounter: Payer: Self-pay | Admitting: Physical Therapy

## 2022-01-18 ENCOUNTER — Ambulatory Visit: Payer: Commercial Managed Care - HMO | Attending: Orthopaedic Surgery | Admitting: Physical Therapy

## 2022-01-18 DIAGNOSIS — M25611 Stiffness of right shoulder, not elsewhere classified: Secondary | ICD-10-CM | POA: Insufficient documentation

## 2022-01-18 DIAGNOSIS — M25511 Pain in right shoulder: Secondary | ICD-10-CM | POA: Diagnosis not present

## 2022-01-18 DIAGNOSIS — M25512 Pain in left shoulder: Secondary | ICD-10-CM | POA: Diagnosis present

## 2022-01-18 DIAGNOSIS — M6281 Muscle weakness (generalized): Secondary | ICD-10-CM | POA: Insufficient documentation

## 2022-01-25 NOTE — Therapy (Signed)
OUTPATIENT PHYSICAL THERAPY TREATMENT NOTE   Patient Name: Jean Pittman MRN: [id number] DOB:08/23/1960, 61 y.o., female Today's Date: 01/26/2022  PCP: Pincus Sanes, MD   REFERRING PROVIDER: Valeria Batman, MD  END OF SESSION:   PT End of Session - 01/26/22 1321     Visit Number 2    Number of Visits 13    Date for PT Re-Evaluation 03/15/22    Authorization Type [employer]    Authorization - Visit Number 2    Authorization - Number of Visits 30   combined PT/OT/chiro   PT Start Time 1330    PT Stop Time 1413    PT Time Calculation (min) 43 min    Activity Tolerance Patient tolerated treatment well;No increased pain    Behavior During Therapy [facility] for tasks assessed/performed             Past Medical History:  Diagnosis Date   Arthritis    Dizziness    Hyperlipidemia    denies   Tubular adenoma of colon 03/30/09   Dr Jarold Motto   Past Surgical History:  Procedure Laterality Date   APPENDECTOMY     with 1st pregnancy   BRAVO PH STUDY N/A 11/30/2017   Procedure: BRAVO PH STUDY;  Surgeon: Rachael Fee, MD;  Location: [facility];  Service: Endoscopy;  Laterality: N/A;   BREAST BIOPSY     needle aspiration X1; resection X 1   colonoscopy with polypectomy  03/30/2009   Dr Jarold Motto   CT SCAN  2017   Chest due to a possible blood clot but came back normal   ESOPHAGOGASTRODUODENOSCOPY (EGD) WITH PROPOFOL N/A 11/30/2017   Procedure: ESOPHAGOGASTRODUODENOSCOPY (EGD) WITH PROPOFOL;  Surgeon: Rachael Fee, MD;  Location: [facility];  Service: Endoscopy;  Laterality: N/A;  48 hour Ph probe   frozen shoulder Left    broke the cartilage-was put to sleep Dr. Norlene Campbell apox 2014   G 3 P 3     3 C sections   TUBAL LIGATION     Patient Active Problem List   Diagnosis Date Noted   Acute pain of right shoulder 11/23/2021   Decreased hearing of right ear 01/19/2021   Acute non-recurrent sinusitis 01/19/2021   Hyperglycemia 09/20/2018   Upper airway cough  syndrome 01/10/2017   Chronic cough 03/22/2016   EKG abnormalities 02/15/2012   ADENOMATOUS COLONIC POLYP 04/16/2010   REACTIVE AIRWAY DISEASE 04/16/2010   Hyperlipidemia 03/12/2009    REFERRING DIAG: M25.511 (ICD-10-CM) - Acute pain of right shoulder   THERAPY DIAG:  Right shoulder pain, unspecified chronicity  Left shoulder pain, unspecified chronicity  Muscle weakness (generalized)  Stiffness of right shoulder, not elsewhere classified  Rationale for Evaluation and Treatment Rehabilitation  PERTINENT HISTORY: osteopenia; self employed, typically quite active  PRECAUTIONS: osteopenia  SUBJECTIVE:  SUBJECTIVE STATEMENT:   Pt states she is doing well today but did have some increased pain with work out classes over the weekend. Reports fatigue today, noticed with exercise class this morning she had more trouble with L shoulder during overhead presses with dumbbells. Has more fatigue than pain. Good compliance with HEP and reports relief from them   PAIN:   Are you having pain: 0-1/10 Location: R scapula, lateral shoulder; L side anterior shoulder into bicep How would you describe your pain? discomfort Best in past week: 0/10 Worst in past week: 5/10 Aggravating factors: exercise, lifting overhead, heavy lifting/carrying Easing factors: medication, rest   OBJECTIVE: (objective measures completed at initial evaluation unless otherwise dated)   DIAGNOSTIC FINDINGS:  X rays unremarkable per referring MD notes and per pt    PATIENT SURVEYS:  FOTO: 71% eval, 75% predicted   COGNITION: Overall cognitive status: Within functional limits for tasks assessed                                  SENSATION: Light touch intact B UE and LE, aside from subjective increase in sensitivity R C5 compared to  L   POSTURE: Grossly WNL   UPPER EXTREMITY ROM:   Active ROM Right eval Left eval  Shoulder flexion 165 162  Shoulder abduction 160 155  Shoulder internal rotation      Shoulder external rotation      Elbow flexion      Elbow extension      Wrist flexion      Wrist extension       (Blank rows = not tested) Comments: more pain with lowering B from flexion   UPPER EXTREMITY MMT:   MMT Right eval Left eval  Shoulder flexion 4 4p!  Shoulder extension      Shoulder abduction 4p! 4p!  Shoulder extension      Shoulder internal rotation 4+ 5  Shoulder external rotation 4- 3+  Elbow flexion 5 (R shoulder discomfort) 5  Elbow extension 5 5 (L triceps discomfort)  Grip strength       (Blank rows = not tested)    PALPATION:  Concordant pain with trigger points in R supraspinatus, infraspinatus, levator scap, and proximal biceps. Supraspinatus trigger point refers down arm L side: most tenderness in infraspinatus and deltoid             TODAY'S TREATMENT:                                                                                                   [facility] Adult PT Treatment:                                                DATE: 01/26/22 Therapeutic Exercise: UBE fwd back during subjective Supine dowel flexion AAROM 2x12, cues for appropriate ROM Seated  serratus punch B, x5 with GTB,  2x8 with RTB, cues for form  Swiss ball flexion rollout, standing at table, x12  Standing row, GTB 2x15, cues for elbow mechanics and improved scapular mechanics Prayer stretch for lats, x10, cues for setup and form Manual Therapy: Passive pin and stretch L lat (GH flexion and ER), STM L UT, LS, infraspinatus, lat, deltoid, bicep; supine                                    [facility] Adult PT Treatment:                                                DATE: 01/18/22 Therapeutic Exercise: Scapular retraction x10 cues for HEP Shoulder ER isometric x5 B UE cues for HEP     PATIENT  EDUCATION: Education details: Pt education on PT impairments, prognosis, and POC. Informed consent. Rationale for interventions, safe/appropriate HEP performance Person educated: Patient Education method: Explanation, Demonstration, Tactile cues, Verbal cues, and Handouts Education comprehension: verbalized understanding, returned demonstration, verbal cues required, tactile cues required, and needs further education     HOME EXERCISE PROGRAM: Access Code: [email or web address] URL: [email or web address] Date: 01/18/2022 Prepared by: Fransisco Hertz   Exercises - Standing Isometric Shoulder External Rotation with Doorway  - 1 x daily - 7 x weekly - 3 sets - 10 reps - Seated Scapular Retraction  - 1 x daily - 7 x weekly - 3 sets - 5 reps   ASSESSMENT:   CLINICAL IMPRESSION: Pt arrives without significant pain, reports good compliance with HEP, no issues. Initiated w/ HEP review and followed with addition of GH AAROM exercises + periscapular strengthening. Cues as needed to reduce compensations as above, requires modification for reduced resistance with serratus presses in sitting due to mild pain/increased work, responds well to modification. Pt tolerates session well with no increase in resting pain, reports improved stiffness/fatigue, no adverse events. Pt departs today's session in no acute distress, all voiced questions/concerns addressed appropriately from PT perspective.      OBJECTIVE IMPAIRMENTS: decreased activity tolerance, decreased mobility, decreased ROM, decreased strength, impaired sensation, impaired UE functional use, and pain.    ACTIVITY LIMITATIONS: carrying, lifting, sleeping, reach over head, and hygiene/grooming   PARTICIPATION LIMITATIONS: meal prep, cleaning, laundry, driving, shopping, community activity, occupation, and yard work   PERSONAL FACTORS: Time since onset of injury/illness/exacerbation are also affecting patient's functional outcome.    REHAB  POTENTIAL: Good   CLINICAL DECISION MAKING: Stable/uncomplicated   EVALUATION COMPLEXITY: Low     GOALS: Goals reviewed with patient? No   SHORT TERM GOALS: Target date: 02/15/2022     Pt will demonstrate appropriate understanding and performance of initially prescribed HEP in order to facilitate improved independence with management of symptoms.  Baseline: HEP provided on eval Goal status: INITIAL    2. Pt will score greater than or equal to 73 on FOTO in order to demonstrate improved perception of function due to symptoms.            Baseline: 71            Goal status: INITIAL    LONG TERM GOALS: Target date: 03/15/2022       Pt will score 75 on FOTO in order to demonstrate improved perception of function due to symptoms. Baseline: 71 Goal  status: INITIAL   2.  Pt will demonstrate independence with final HEP to facilitate improved long term management of symptoms. Baseline: initial HEP prescribed Goal status: INITIAL   3.  Pt will demonstrate at least 4/5 and grossly symmetrical shoulder MMT B for improved symmetry of UE strength and improved tolerance to functional movements.  Baseline: see MMT chart above Goal status: INITIAL   4. Pt will report/demonstrate ability to perform typical household activities with less than 2 point increase in pain on NPS in order to indicative improved tolerance/independence for functional tasks.             Baseline: up to 5/10 on NPS with household activities            Goal status: INITIAL    5. Pt will demonstrate grossly WFL shoulder elevation B with less than 2/10 pain on NPS for improved tolerance to overhead tasks.            Baseline: pain with lowering from elevation B, see ROM chart above            Goal status: initial   PLAN:   PT FREQUENCY: 2x/week   PT DURATION: 6 weeks   PLANNED INTERVENTIONS: Therapeutic exercises, Therapeutic activity, Neuromuscular re-education, Balance training, Gait training, Patient/Family  education, Self Care, Joint mobilization, Dry Needling, Electrical stimulation, Spinal mobilization, Cryotherapy, Moist heat, Taping, Manual therapy, and Re-evaluation   PLAN FOR NEXT SESSION: Progress ROM/strengthening exercises as able/appropriate, review HEP. Assess upper limb tension (pt notes some numbness with overhead presses at gym today). Could benefit from more biceps/rotator cuff work   Ashley Murrain PT, DPT 01/26/2022 2:18 PM

## 2022-01-26 ENCOUNTER — Ambulatory Visit: Payer: Commercial Managed Care - HMO | Admitting: Physical Therapy

## 2022-01-26 ENCOUNTER — Encounter: Payer: Self-pay | Admitting: Physical Therapy

## 2022-01-26 DIAGNOSIS — M25512 Pain in left shoulder: Secondary | ICD-10-CM

## 2022-01-26 DIAGNOSIS — M25611 Stiffness of right shoulder, not elsewhere classified: Secondary | ICD-10-CM

## 2022-01-26 DIAGNOSIS — M25511 Pain in right shoulder: Secondary | ICD-10-CM | POA: Diagnosis not present

## 2022-01-26 DIAGNOSIS — M6281 Muscle weakness (generalized): Secondary | ICD-10-CM

## 2022-01-27 ENCOUNTER — Encounter: Payer: Self-pay | Admitting: Physical Therapy

## 2022-01-27 ENCOUNTER — Ambulatory Visit: Payer: Commercial Managed Care - HMO | Admitting: Physical Therapy

## 2022-01-27 DIAGNOSIS — M25512 Pain in left shoulder: Secondary | ICD-10-CM

## 2022-01-27 DIAGNOSIS — M25511 Pain in right shoulder: Secondary | ICD-10-CM | POA: Diagnosis not present

## 2022-01-27 DIAGNOSIS — M6281 Muscle weakness (generalized): Secondary | ICD-10-CM

## 2022-01-27 NOTE — Therapy (Signed)
OUTPATIENT PHYSICAL THERAPY TREATMENT NOTE   Patient Name: Jean Pittman MRN: [id number] DOB:08/19/1960, 61 y.o., female Today's Date: 01/27/2022  PCP: Binnie Rail, MD   REFERRING PROVIDER: Garald Balding, MD  END OF SESSION:   PT End of Session - 01/27/22 1018     Visit Number 3    Number of Visits 13    Date for PT Re-Evaluation 03/15/22    Authorization Type [employer]    Authorization - Number of Visits 27    PT Start Time 1018              Past Medical History:  Diagnosis Date   Arthritis    Dizziness    Hyperlipidemia    denies   Tubular adenoma of colon 03/30/09   Dr Sharlett Iles   Past Surgical History:  Procedure Laterality Date   APPENDECTOMY     with 1st pregnancy   BRAVO [place] STUDY N/A 11/30/2017   Procedure: BRAVO [place] STUDY;  Surgeon: Milus Banister, MD;  Location: [facility];  Service: Endoscopy;  Laterality: N/A;   BREAST BIOPSY     needle aspiration X1; resection X 1   colonoscopy with polypectomy  03/30/2009   Dr Sharlett Iles   CT SCAN  2017   Chest due to a possible blood clot but came back normal   ESOPHAGOGASTRODUODENOSCOPY (EGD) WITH PROPOFOL N/A 11/30/2017   Procedure: ESOPHAGOGASTRODUODENOSCOPY (EGD) WITH PROPOFOL;  Surgeon: Milus Banister, MD;  Location: [facility];  Service: Endoscopy;  Laterality: N/A;  48 hour Ph probe   frozen shoulder Left    broke the cartilage-was put to sleep Dr. Joni Fears apox 2014   G 3 P 3     3 C sections   TUBAL LIGATION     Patient Active Problem List   Diagnosis Date Noted   Acute pain of right shoulder 11/23/2021   Decreased hearing of right ear 01/19/2021   Acute non-recurrent sinusitis 01/19/2021   Hyperglycemia 09/20/2018   Upper airway cough syndrome 01/10/2017   Chronic cough 03/22/2016   EKG abnormalities 02/15/2012   ADENOMATOUS COLONIC POLYP 04/16/2010   REACTIVE AIRWAY DISEASE 04/16/2010   Hyperlipidemia 03/12/2009    REFERRING DIAG: M25.511 (ICD-10-CM) - Acute pain of right  shoulder   THERAPY DIAG:  Right shoulder pain, unspecified chronicity  Left shoulder pain, unspecified chronicity  Muscle weakness (generalized)  Rationale for Evaluation and Treatment Rehabilitation  PERTINENT HISTORY: osteopenia; self employed, typically quite active  PRECAUTIONS: osteopenia  SUBJECTIVE:  SUBJECTIVE STATEMENT:   "I feel things are going well. I feel discomfort but not pain."  PAIN:   Are you having pain: 0/10 Location: R scapula, lateral shoulder; L side anterior shoulder into bicep How would you describe your pain? discomfort Best in past week: 0/10 Worst in past week: 5/10 Aggravating factors: exercise, lifting overhead, heavy lifting/carrying Easing factors: medication, rest   OBJECTIVE: (objective measures completed at initial evaluation unless otherwise dated)   DIAGNOSTIC FINDINGS:  X rays unremarkable per referring MD notes and per pt    PATIENT SURVEYS:  FOTO: 71% eval, 75% predicted   COGNITION: Overall cognitive status: Within functional limits for tasks assessed                                  SENSATION: Light touch intact B UE and LE, aside from subjective increase in sensitivity R C5 compared to L   POSTURE: Grossly WNL   UPPER EXTREMITY ROM:   Active ROM Right eval Left eval  Shoulder flexion 165 162  Shoulder abduction 160 155  Shoulder internal rotation      Shoulder external rotation      Elbow flexion      Elbow extension      Wrist flexion      Wrist extension       (Blank rows = not tested) Comments: more pain with lowering B from flexion   UPPER EXTREMITY MMT:   MMT Right eval Left eval  Shoulder flexion 4 4p!  Shoulder extension      Shoulder abduction 4p! 4p!  Shoulder extension      Shoulder internal rotation 4+ 5   Shoulder external rotation 4- 3+  Elbow flexion 5 (R shoulder discomfort) 5  Elbow extension 5 5 (L triceps discomfort)  Grip strength       (Blank rows = not tested)    PALPATION:  Concordant pain with trigger points in R supraspinatus, infraspinatus, levator scap, and proximal biceps. Supraspinatus trigger point refers down arm L side: most tenderness in infraspinatus and deltoid             TODAY'S TREATMENT:                                                                                                    [facility] Adult PT Treatment:                                                DATE: 01/27/2022 Therapeutic Exercise: UBE L 5 x 5 min (FWD/ BWD 2:30) L upper trap stretch 2 x 30 sec Push up with plus 2 x 10  Scaption 2 x 12 with 2#,  Lower trap strengthening with elbows propped on bolster 2 x 12 with RTB  Seated horizontal abduction 2 x 10 with RTB Supine Landmine press 1 x 10 with 5#  Updated HEP to include  scaption, upper trap stretch  Manual Therapy: MTPR along the R upper trap x 3    [facility] Adult PT Treatment:                                                DATE: 01/26/22 Therapeutic Exercise: UBE 59mn fwd 268m back during subjective Supine dowel flexion AAROM 2x12, cues for appropriate ROM Seated  serratus punch B, x5 with GTB, 2x8 with RTB, cues for form  Swiss ball flexion rollout, standing at table, x12  Standing row, GTB 2x15, cues for elbow mechanics and improved scapular mechanics Prayer stretch for lats, x10, cues for setup and for Manual Therapy: Passive pin and stretch L lat (GH flexion and ER), STM L UT, LS, infraspinatus, lat, deltoid, bicep; supine                                    [facility] Adult PT Treatment:                                                DATE: 01/18/22 Therapeutic Exercise: Scapular retraction x10 cues for HEP Shoulder ER isometric x5 B UE cues for HEP     PATIENT EDUCATION: Education details: Pt education on PT impairments, prognosis, and  POC. Informed consent. Rationale for interventions, safe/appropriate HEP performance Person educated: Patient Education method: Explanation, Demonstration, Tactile cues, Verbal cues, and Handouts Education comprehension: verbalized understanding, returned demonstration, verbal cues required, tactile cues required, and needs further education     HOME EXERCISE PROGRAM: Access Code: [email or web address] URL: [email or web address] Date: 01/27/2022 Prepared by: KrStarr LakeExercises - Standing Isometric Shoulder External Rotation with Doorway  - 1 x daily - 7 x weekly - 3 sets - 5 reps - Seated Scapular Retraction  - 1 x daily - 7 x weekly - 3 sets - 10 reps - Seated Upper Trapezius Stretch (Mirrored)  - 2 x daily - 7 x weekly - 2 sets - 2 reps - 30 seconds hold - Standing Shoulder Scaption  - 1 x daily - 7 x weekly - 2 sets - 10 reps - Wall Push Up with Plus  - 1 x daily - 7 x weekly - 2 sets - 10 reps - 5 hold - Supine Bilateral Shoulder Protraction  - 1 x daily - 7 x weekly - 2 sets - 10 reps   ASSESSMENT:   CLINICAL IMPRESSION: Mrs LaDesarrives to PT today reporting no pain in the shoulder but does continue to report discomfort that occurs when she is abducting/ flexing her mostly in mid range. She noted improvement of the shoulder with scapular upward rotation assist so worked on strengthening to maximize scapulohumeral rhythm. She did well with all exercises noting some soreness with the wall push up with a plus but was able to complete exercise with no residual soreness. End of session she continues to report no pain.     OBJECTIVE IMPAIRMENTS: decreased activity tolerance, decreased mobility, decreased ROM, decreased strength, impaired sensation, impaired UE functional use, and pain.    ACTIVITY LIMITATIONS: carrying, lifting, sleeping, reach over head, and hygiene/grooming   PARTICIPATION LIMITATIONS: meal prep, cleaning,  laundry, driving, shopping, community activity,  occupation, and yard work   PERSONAL FACTORS: Time since onset of injury/illness/exacerbation are also affecting patient's functional outcome.    REHAB POTENTIAL: Good   CLINICAL DECISION MAKING: Stable/uncomplicated   EVALUATION COMPLEXITY: Low     GOALS: Goals reviewed with patient? No   SHORT TERM GOALS: Target date: 02/15/2022     Pt will demonstrate appropriate understanding and performance of initially prescribed HEP in order to facilitate improved independence with management of symptoms.  Baseline: HEP provided on eval Goal status: INITIAL    2. Pt will score greater than or equal to 73 on FOTO in order to demonstrate improved perception of function due to symptoms.            Baseline: 71            Goal status: INITIAL    LONG TERM GOALS: Target date: 03/15/2022       Pt will score 75 on FOTO in order to demonstrate improved perception of function due to symptoms. Baseline: 71 Goal status: INITIAL   2.  Pt will demonstrate independence with final HEP to facilitate improved long term management of symptoms. Baseline: initial HEP prescribed Goal status: INITIAL   3.  Pt will demonstrate at least 4/5 and grossly symmetrical shoulder MMT B for improved symmetry of UE strength and improved tolerance to functional movements.  Baseline: see MMT chart above Goal status: INITIAL   4. Pt will report/demonstrate ability to perform typical household activities with less than 2 point increase in pain on NPS in order to indicative improved tolerance/independence for functional tasks.             Baseline: up to 5/10 on NPS with household activities            Goal status: INITIAL    5. Pt will demonstrate grossly WFL shoulder elevation B with less than 2/10 pain on NPS for improved tolerance to overhead tasks.            Baseline: pain with lowering from elevation B, see ROM chart above            Goal status: initial   PLAN:   PT FREQUENCY: 2x/week   PT DURATION: 6  weeks   PLANNED INTERVENTIONS: Therapeutic exercises, Therapeutic activity, Neuromuscular re-education, Balance training, Gait training, Patient/Family education, Self Care, Joint mobilization, Dry Needling, Electrical stimulation, Spinal mobilization, Cryotherapy, Moist heat, Taping, Manual therapy, and Re-evaluation   PLAN FOR NEXT SESSION: Progress ROM/strengthening exercises as able/appropriate, review HEP. Assess upper limb tension (pt notes some numbness with overhead presses at gym today). Could benefit from more biceps/rotator cuff work   [employer] PT, DPT, LAT, ATC  01/27/22  11:02 AM

## 2022-02-01 ENCOUNTER — Ambulatory Visit: Payer: Commercial Managed Care - HMO | Attending: Orthopaedic Surgery | Admitting: Physical Therapy

## 2022-02-01 DIAGNOSIS — M25611 Stiffness of right shoulder, not elsewhere classified: Secondary | ICD-10-CM | POA: Insufficient documentation

## 2022-02-01 DIAGNOSIS — M25512 Pain in left shoulder: Secondary | ICD-10-CM | POA: Insufficient documentation

## 2022-02-01 DIAGNOSIS — M25511 Pain in right shoulder: Secondary | ICD-10-CM | POA: Diagnosis present

## 2022-02-01 DIAGNOSIS — M6281 Muscle weakness (generalized): Secondary | ICD-10-CM | POA: Insufficient documentation

## 2022-02-01 NOTE — Therapy (Signed)
OUTPATIENT PHYSICAL THERAPY TREATMENT NOTE   Patient Name: Jean Pittman MRN: [id number] DOB:04-Jun-1960, 61 y.o., female Today's Date: 02/01/2022  PCP: Binnie Rail, MD   REFERRING PROVIDER: Garald Balding, MD  END OF SESSION:   PT End of Session - 02/01/22 1316     Visit Number 4    Number of Visits 13    Date for PT Re-Evaluation 03/15/22    Authorization Type [employer]    Authorization - Number of Visits 30    PT Start Time 1315    PT Stop Time 1400    PT Time Calculation (min) 45 min              Past Medical History:  Diagnosis Date   Arthritis    Dizziness    Hyperlipidemia    denies   Tubular adenoma of colon 03/30/09   Dr Sharlett Iles   Past Surgical History:  Procedure Laterality Date   APPENDECTOMY     with 1st pregnancy   BRAVO [place] STUDY N/A 11/30/2017   Procedure: BRAVO [place] STUDY;  Surgeon: Milus Banister, MD;  Location: [facility];  Service: Endoscopy;  Laterality: N/A;   BREAST BIOPSY     needle aspiration X1; resection X 1   colonoscopy with polypectomy  03/30/2009   Dr Sharlett Iles   CT SCAN  2017   Chest due to a possible blood clot but came back normal   ESOPHAGOGASTRODUODENOSCOPY (EGD) WITH PROPOFOL N/A 11/30/2017   Procedure: ESOPHAGOGASTRODUODENOSCOPY (EGD) WITH PROPOFOL;  Surgeon: Milus Banister, MD;  Location: [facility];  Service: Endoscopy;  Laterality: N/A;  48 hour Ph probe   frozen shoulder Left    broke the cartilage-was put to sleep Dr. Joni Fears apox 2014   G 3 P 3     3 C sections   TUBAL LIGATION     Patient Active Problem List   Diagnosis Date Noted   Acute pain of right shoulder 11/23/2021   Decreased hearing of right ear 01/19/2021   Acute non-recurrent sinusitis 01/19/2021   Hyperglycemia 09/20/2018   Upper airway cough syndrome 01/10/2017   Chronic cough 03/22/2016   EKG abnormalities 02/15/2012   ADENOMATOUS COLONIC POLYP 04/16/2010   REACTIVE AIRWAY DISEASE 04/16/2010   Hyperlipidemia 03/12/2009     REFERRING DIAG: M25.511 (ICD-10-CM) - Acute pain of right shoulder   THERAPY DIAG:  Right shoulder pain, unspecified chronicity  Left shoulder pain, unspecified chronicity  Muscle weakness (generalized)  Stiffness of right shoulder, not elsewhere classified  Rationale for Evaluation and Treatment Rehabilitation  PERTINENT HISTORY: osteopenia; self employed, typically quite active  PRECAUTIONS: osteopenia  SUBJECTIVE:  SUBJECTIVE STATEMENT:   "No pain now. I did have some body pain over the weekend but I was moving wood so that probably was the reason."  PAIN:   Are you having pain: 0/10 Location: R scapula, lateral shoulder; L side anterior shoulder into bicep How would you describe your pain? discomfort Best in past week: 0/10 Worst in past week: 5/10 Aggravating factors: exercise, lifting overhead, heavy lifting/carrying Easing factors: medication, rest   OBJECTIVE: (objective measures completed at initial evaluation unless otherwise dated)   DIAGNOSTIC FINDINGS:  X rays unremarkable per referring MD notes and per pt    PATIENT SURVEYS:  FOTO: 71% eval, 75% predicted   COGNITION: Overall cognitive status: Within functional limits for tasks assessed                                  SENSATION: Light touch intact B UE and LE, aside from subjective increase in sensitivity R C5 compared to L   POSTURE: Grossly WNL   UPPER EXTREMITY ROM:   Active ROM Right eval Left eval  Shoulder flexion 165 162  Shoulder abduction 160 155  Shoulder internal rotation      Shoulder external rotation      Elbow flexion      Elbow extension      Wrist flexion      Wrist extension       (Blank rows = not tested) Comments: more pain with lowering B from flexion   UPPER EXTREMITY MMT:   MMT  Right eval Left eval  Shoulder flexion 4 4p!  Shoulder extension      Shoulder abduction 4p! 4p!  Shoulder extension      Shoulder internal rotation 4+ 5  Shoulder external rotation 4- 3+  Elbow flexion 5 (R shoulder discomfort) 5  Elbow extension 5 5 (L triceps discomfort)  Grip strength       (Blank rows = not tested)    PALPATION:  Concordant pain with trigger points in R supraspinatus, infraspinatus, levator scap, and proximal biceps. Supraspinatus trigger point refers down arm L side: most tenderness in infraspinatus and deltoid             TODAY'S TREATMENT:                                                                                                   [facility] Adult PT Treatment:                                                DATE: 02/01/22 Therapeutic Exercise: UBE L2 2.5 min each way  Lower trap strengthening with elbows propped on bolster 2 x 12 with RTB , 3rd set with Green  Horizontal Shoulder abduction 10 x 2 red band , seated - increased left shoulder pain Supine horizontals with green band Supine alternating diagonals 10 x 2  Supine protraction holding 5#  x 20  Manual Therapy: GHJ mobs with PROM left shoulder STW Left deltoid upper arm    [facility] Adult PT Treatment:                                                DATE: 01/27/2022 Therapeutic Exercise: UBE L 5 x 5 min (FWD/ BWD 2:30) L upper trap stretch 2 x 30 sec Push up with plus 2 x 10  Scaption 2 x 12 with 2#,  Lower trap strengthening with elbows propped on bolster 2 x 12 with RTB  Seated horizontal abduction 2 x 10 with RTB Supine Landmine press 1 x 10 with 5#  Updated HEP to include scaption, upper trap stretch  Manual Therapy: MTPR along the R upper trap x 3    [facility] Adult PT Treatment:                                                DATE: 01/26/22 Therapeutic Exercise: UBE 31mn fwd 270m back during subjective Supine dowel flexion AAROM 2x12, cues for appropriate ROM Seated  serratus punch B, x5 with  GTB, 2x8 with RTB, cues for form  Swiss ball flexion rollout, standing at table, x12  Standing row, GTB 2x15, cues for elbow mechanics and improved scapular mechanics Prayer stretch for lats, x10, cues for setup and for Manual Therapy: Passive pin and stretch L lat (GH flexion and ER), STM L UT, LS, infraspinatus, lat, deltoid, bicep; supine                                    [facility] Adult PT Treatment:                                                DATE: 01/18/22 Therapeutic Exercise: Scapular retraction x10 cues for HEP Shoulder ER isometric x5 B UE cues for HEP     PATIENT EDUCATION: Education details: Pt education on PT impairments, prognosis, and POC. Informed consent. Rationale for interventions, safe/appropriate HEP performance Person educated: Patient Education method: Explanation, Demonstration, Tactile cues, Verbal cues, and Handouts Education comprehension: verbalized understanding, returned demonstration, verbal cues required, tactile cues required, and needs further education     HOME EXERCISE PROGRAM: Access Code: [email or web address] URL: [email or web address] Date: 01/27/2022 Prepared by: KrStarr LakeExercises - Standing Isometric Shoulder External Rotation with Doorway  - 1 x daily - 7 x weekly - 3 sets - 5 reps - Seated Scapular Retraction  - 1 x daily - 7 x weekly - 3 sets - 10 reps - Seated Upper Trapezius Stretch (Mirrored)  - 2 x daily - 7 x weekly - 2 sets - 2 reps - 30 seconds hold - Standing Shoulder Scaption  - 1 x daily - 7 x weekly - 2 sets - 10 reps - Wall Push Up with Plus  - 1 x daily - 7 x weekly - 2 sets - 10 reps - 5 hold - Supine Bilateral Shoulder Protraction  - 1 x daily - 7 x weekly -  2 sets - 10 reps   ASSESSMENT:   CLINICAL IMPRESSION: Mrs Daddario arrives to PT today reporting no pain in the shoulder but does continue to report discomfort that occurs when she is abducting/ flexing her mostly in mid range. She points to lateral should  for pain location that travels 5 inches down upper arm. Reviewed recent additions to HEP and offered modifications for  supine as seated resistance bands were causing her some increased pain. She is considering requesting injection of left shoulder from MD. Worked on left shoulder PROM and joint mobility with mobilizations and PROM. Tended to hike shoulder and guard into ER however with scapular depression she was able to tolerate full ER ROM. At end of session she reported continued pain with abduction and flexion at end range AROM. No updates this session to HEP.     OBJECTIVE IMPAIRMENTS: decreased activity tolerance, decreased mobility, decreased ROM, decreased strength, impaired sensation, impaired UE functional use, and pain.    ACTIVITY LIMITATIONS: carrying, lifting, sleeping, reach over head, and hygiene/grooming   PARTICIPATION LIMITATIONS: meal prep, cleaning, laundry, driving, shopping, community activity, occupation, and yard work   PERSONAL FACTORS: Time since onset of injury/illness/exacerbation are also affecting patient's functional outcome.    REHAB POTENTIAL: Good   CLINICAL DECISION MAKING: Stable/uncomplicated   EVALUATION COMPLEXITY: Low     GOALS: Goals reviewed with patient? No   SHORT TERM GOALS: Target date: 02/15/2022     Pt will demonstrate appropriate understanding and performance of initially prescribed HEP in order to facilitate improved independence with management of symptoms.  Baseline: HEP provided on eval Goal status: MET 02/01/22   2. Pt will score greater than or equal to 73 on FOTO in order to demonstrate improved perception of function due to symptoms.            Baseline: 71            Goal status: INITIAL    LONG TERM GOALS: Target date: 03/15/2022       Pt will score 75 on FOTO in order to demonstrate improved perception of function due to symptoms. Baseline: 71 Goal status: INITIAL   2.  Pt will demonstrate independence with final HEP  to facilitate improved long term management of symptoms. Baseline: initial HEP prescribed Goal status: INITIAL   3.  Pt will demonstrate at least 4/5 and grossly symmetrical shoulder MMT B for improved symmetry of UE strength and improved tolerance to functional movements.  Baseline: see MMT chart above Goal status: INITIAL   4. Pt will report/demonstrate ability to perform typical household activities with less than 2 point increase in pain on NPS in order to indicative improved tolerance/independence for functional tasks.             Baseline: up to 5/10 on NPS with household activities            Goal status: INITIAL    5. Pt will demonstrate grossly WFL shoulder elevation B with less than 2/10 pain on NPS for improved tolerance to overhead tasks.            Baseline: pain with lowering from elevation B, see ROM chart above            Goal status: initial   PLAN:   PT FREQUENCY: 2x/week   PT DURATION: 6 weeks   PLANNED INTERVENTIONS: Therapeutic exercises, Therapeutic activity, Neuromuscular re-education, Balance training, Gait training, Patient/Family education, Self Care, Joint mobilization, Dry Needling, Electrical stimulation, Spinal mobilization,  Cryotherapy, Moist heat, Taping, Manual therapy, and Re-evaluation   PLAN FOR NEXT SESSION: Progress ROM/strengthening exercises as able/appropriate, review HEP. Assess upper limb tension (pt notes some numbness with overhead presses at gym today). Could benefit from more biceps/rotator cuff work  Hessie Diener, PTA 02/01/22 2:12 PM Phone: [phone] Fax: [phone]

## 2022-02-02 ENCOUNTER — Ambulatory Visit (INDEPENDENT_AMBULATORY_CARE_PROVIDER_SITE_OTHER): Payer: Commercial Managed Care - HMO | Admitting: Orthopaedic Surgery

## 2022-02-02 ENCOUNTER — Ambulatory Visit (INDEPENDENT_AMBULATORY_CARE_PROVIDER_SITE_OTHER): Payer: Commercial Managed Care - HMO

## 2022-02-02 ENCOUNTER — Encounter: Payer: Self-pay | Admitting: Orthopaedic Surgery

## 2022-02-02 DIAGNOSIS — G8929 Other chronic pain: Secondary | ICD-10-CM

## 2022-02-02 DIAGNOSIS — M25512 Pain in left shoulder: Secondary | ICD-10-CM | POA: Diagnosis not present

## 2022-02-02 DIAGNOSIS — M7542 Impingement syndrome of left shoulder: Secondary | ICD-10-CM | POA: Diagnosis not present

## 2022-02-02 MED ORDER — LIDOCAINE HCL 2 % IJ SOLN
2.0000 mL | INTRAMUSCULAR | Status: AC | PRN
  Administered 2022-02-02: 2 mL

## 2022-02-02 MED ORDER — METHYLPREDNISOLONE ACETATE 40 MG/ML IJ SUSP
80.0000 mg | INTRAMUSCULAR | Status: AC | PRN
  Administered 2022-02-02: 80 mg via INTRA_ARTICULAR

## 2022-02-02 MED ORDER — BUPIVACAINE HCL 0.25 % IJ SOLN
2.0000 mL | INTRAMUSCULAR | Status: AC | PRN
  Administered 2022-02-02: 2 mL via INTRA_ARTICULAR

## 2022-02-02 NOTE — Progress Notes (Signed)
Office Visit Note   Patient: Jean Pittman           Date of Birth: 03/05/1960           MRN: [id number] Visit Date: 02/02/2022              Requested by: Binnie Rail, MD [place],  [place] [id number] PCP: Binnie Rail, MD   Assessment & Plan: Visit Diagnoses:  1. Chronic left shoulder pain   2. Impingement syndrome of left shoulder     Plan: Mrs. Bickle has been followed for the problems referable to her cervical spine and both shoulders.  She is actually doing very well except for some residual pain in her left shoulder.  She does have positive impingement testing.  She is able to raise her arm fully overhead without evidence of adhesive capsulitis.  She had a cortisone injection in the subacromial area of her right shoulder months ago and it made a big difference with her right shoulder pain and she wanted to have a cortisone injection in the same area of the left side.  In addition she is she does have carpal tunnel and there is relatively mild but sometimes a nuisance and she wanted to know if physical therapy could follow her for that as well as the therapy for both shoulders  Follow-Up Instructions: Return if symptoms worsen or fail to improve.   Orders:  Orders Placed This Encounter  Procedures   XR Shoulder Left   No orders of the defined types were placed in this encounter.     Procedures: Large Joint Inj: L subacromial bursa on 02/02/2022 2:02 PM Indications: pain and diagnostic evaluation Details: 25 G 1.5 in needle, anterolateral approach  Arthrogram: No  Medications: 2 mL lidocaine 2 %; 80 mg methylPREDNISolone acetate 40 MG/ML; 2 mL bupivacaine 0.25 % Consent was given by the patient. Immediately prior to procedure a time out was called to verify the correct patient, procedure, equipment, support staff and site/side marked as required. Patient was prepped and draped in the usual sterile fashion.       Clinical Data: No additional  findings.   Subjective: Chief Complaint  Patient presents with   Left Shoulder - Follow-up  Chronic history of left shoulder pain that has recently been dominated by right shoulder pain.  This had a subacromial cortisone injection on the right with excellent relief and has been going to physical therapy.  Using some over-the-counter medicines that help.  The left shoulder reached the point where it is difficult for her to raise her arm over her head on occasion and even sleeping on that side.  In addition she has what appears to be clinical carpal tunnel and she like to consider physical therapy for that as well.  She wants to continue with therapy for both of her shoulders as it does seem to make a difference and would like a cortisone injection in her left shoulder today  HPI  Review of Systems   Objective: Vital Signs: LMP 07/01/2013   Physical Exam Constitutional:      Appearance: She is well-developed.  Pulmonary:     Effort: Pulmonary effort is normal.  Skin:    General: Skin is warm and dry.  Neurological:     Mental Status: She is alert and oriented to person, place, and time.  Psychiatric:        Behavior: Behavior normal.     Ortho Exam awake alert  and oriented x3.  Comfortable sitting and in no acute distress.  Painless range of motion of right shoulder negative impingement.  There were no areas of tenderness about either scapula.  No stiffness of her neck no pain with motion.  Positive impingement and empty can testing left shoulder.  Negative speeds sign.  Biceps intact.  Skin intact.  Neurologically intact just about full overhead motion and normal external rotation on the left side  Specialty Comments:  No specialty comments available.  Imaging: XR Shoulder Left  Result Date: 02/02/2022 Films of the left shoulder obtained in multiple projections.  There is an area of ectopic calcification consistent with calcific bursitis.  There are degenerative changes at the  [facility] joint.  Humeral head is centered in the glenoid.  Seems to have a normal space between the humeral head and the acromion.  Probably a grade 2 acromion.  No acute changes    PMFS History: Patient Active Problem List   Diagnosis Date Noted   Impingement syndrome of left shoulder 02/02/2022   Acute pain of right shoulder 11/23/2021   Decreased hearing of right ear 01/19/2021   Acute non-recurrent sinusitis 01/19/2021   Hyperglycemia 09/20/2018   Upper airway cough syndrome 01/10/2017   Chronic cough 03/22/2016   EKG abnormalities 02/15/2012   ADENOMATOUS COLONIC POLYP 04/16/2010   REACTIVE AIRWAY DISEASE 04/16/2010   Hyperlipidemia 03/12/2009   Past Medical History:  Diagnosis Date   Arthritis    Dizziness    Hyperlipidemia    denies   Tubular adenoma of colon 03/30/09   Dr Sharlett Iles    Family History  Problem Relation Age of Onset   Colon cancer Mother    Cancer - Colon Mother    Heart disease Father    Hyperlipidemia Father    Stroke Maternal Aunt        ? . one M aunt;> 65   Colon cancer Maternal Aunt    Colon cancer Maternal Aunt    Breast cancer Paternal Aunt    Diabetes Paternal Aunt    Esophageal cancer Neg Hx    Stomach cancer Neg Hx    Rectal cancer Neg Hx     Past Surgical History:  Procedure Laterality Date   APPENDECTOMY     with 1st pregnancy   BRAVO [place] STUDY N/A 11/30/2017   Procedure: BRAVO [place] STUDY;  Surgeon: Milus Banister, MD;  Location: [facility];  Service: Endoscopy;  Laterality: N/A;   BREAST BIOPSY     needle aspiration X1; resection X 1   colonoscopy with polypectomy  03/30/2009   Dr Sharlett Iles   CT SCAN  2017   Chest due to a possible blood clot but came back normal   ESOPHAGOGASTRODUODENOSCOPY (EGD) WITH PROPOFOL N/A 11/30/2017   Procedure: ESOPHAGOGASTRODUODENOSCOPY (EGD) WITH PROPOFOL;  Surgeon: Milus Banister, MD;  Location: [facility];  Service: Endoscopy;  Laterality: N/A;  48 hour Ph probe   frozen shoulder Left    broke the  cartilage-was put to sleep Dr. Joni Fears apox 2014   G 3 P 3     3 C sections   TUBAL LIGATION     Social History   Occupational History   Not on file  Tobacco Use   Smoking status: Never   Smokeless tobacco: Never  Vaping Use   Vaping Use: Never used  Substance and Sexual Activity   Alcohol use: No   Drug use: No   Sexual activity: Not Currently    Partners:  Male    Birth control/protection: Post-menopausal    Comment: 1st intercourse- 11, partners- 2, married- 32 yrs      Garald Balding, MD   Note - This record has been created using [employer].  Chart creation errors have been sought, but may not always  have been located. Such creation errors do not reflect on  the standard of medical care.

## 2022-02-03 ENCOUNTER — Ambulatory Visit: Payer: Commercial Managed Care - HMO | Admitting: Physical Therapy

## 2022-02-08 ENCOUNTER — Encounter: Payer: Self-pay | Admitting: Physical Therapy

## 2022-02-08 ENCOUNTER — Ambulatory Visit: Payer: Commercial Managed Care - HMO | Admitting: Physical Therapy

## 2022-02-08 DIAGNOSIS — M6281 Muscle weakness (generalized): Secondary | ICD-10-CM

## 2022-02-08 DIAGNOSIS — M25511 Pain in right shoulder: Secondary | ICD-10-CM

## 2022-02-08 DIAGNOSIS — M25512 Pain in left shoulder: Secondary | ICD-10-CM

## 2022-02-08 NOTE — Therapy (Signed)
OUTPATIENT PHYSICAL THERAPY TREATMENT NOTE   Patient Name: Jean Pittman MRN: [id number] DOB:12-31-1960, 61 y.o., female Today's Date: 02/08/2022  PCP: Binnie Rail, MD   REFERRING PROVIDER: Garald Balding, MD  END OF SESSION:   PT End of Session - 02/08/22 1017     Visit Number 5    Number of Visits 13    Authorization Type [employer]    Authorization - Number of Visits 30    PT Start Time 1019    PT Stop Time 1101    PT Time Calculation (min) 42 min    Activity Tolerance Patient tolerated treatment well;No increased pain    Behavior During Therapy [facility] for tasks assessed/performed               Past Medical History:  Diagnosis Date   Arthritis    Dizziness    Hyperlipidemia    denies   Tubular adenoma of colon 03/30/09   Dr Sharlett Iles   Past Surgical History:  Procedure Laterality Date   APPENDECTOMY     with 1st pregnancy   BRAVO [place] STUDY N/A 11/30/2017   Procedure: BRAVO [place];  Surgeon: Milus Banister, MD;  Location: [facility];  Service: Endoscopy;  Laterality: N/A;   BREAST BIOPSY     needle aspiration X1; resection X 1   colonoscopy with polypectomy  03/30/2009   Dr Sharlett Iles   CT SCAN  2017   Chest due to a possible blood clot but came back normal   ESOPHAGOGASTRODUODENOSCOPY (EGD) WITH PROPOFOL N/A 11/30/2017   Procedure: ESOPHAGOGASTRODUODENOSCOPY (EGD) WITH PROPOFOL;  Surgeon: Milus Banister, MD;  Location: [facility];  Service: Endoscopy;  Laterality: N/A;  48 hour Ph probe   frozen shoulder Left    broke the cartilage-was put to sleep Dr. Joni Fears apox 2014   G 3 P 3     3 C sections   TUBAL LIGATION     Patient Active Problem List   Diagnosis Date Noted   Impingement syndrome of left shoulder 02/02/2022   Acute pain of right shoulder 11/23/2021   Decreased hearing of right ear 01/19/2021   Acute non-recurrent sinusitis 01/19/2021   Hyperglycemia 09/20/2018   Upper airway cough syndrome 01/10/2017   Chronic cough  03/22/2016   EKG abnormalities 02/15/2012   ADENOMATOUS COLONIC POLYP 04/16/2010   REACTIVE AIRWAY DISEASE 04/16/2010   Hyperlipidemia 03/12/2009    REFERRING DIAG: M25.511 (ICD-10-CM) - Acute pain of right shoulder   THERAPY DIAG:  Right shoulder pain, unspecified chronicity  Left shoulder pain, unspecified chronicity  Muscle weakness (generalized)  Rationale for Evaluation and Treatment Rehabilitation  PERTINENT HISTORY: osteopenia; self employed, typically quite active  PRECAUTIONS: osteopenia  SUBJECTIVE:  SUBJECTIVE STATEMENT:   "I got a shot since the last session and I am feeling much better."  PAIN:   Are you having pain: 0/10 Location: R scapula, lateral shoulder; L side anterior shoulder into bicep How would you describe your pain? discomfort Best in past week: 0/10 Worst in past week: 5/10 Aggravating factors: exercise, lifting overhead, heavy lifting/carrying Easing factors: medication, rest   OBJECTIVE: (objective measures completed at initial evaluation unless otherwise dated)   DIAGNOSTIC FINDINGS:  X rays unremarkable per referring MD notes and per pt    PATIENT SURVEYS:  FOTO: 71% eval, 75% predicted   COGNITION: Overall cognitive status: Within functional limits for tasks assessed                                  SENSATION: Light touch intact B UE and LE, aside from subjective increase in sensitivity R C5 compared to L   POSTURE: Grossly WNL   UPPER EXTREMITY ROM:   Active ROM Right eval Left eval  Shoulder flexion 165 162  Shoulder abduction 160 155  Shoulder internal rotation      Shoulder external rotation      Elbow flexion      Elbow extension      Wrist flexion      Wrist extension       (Blank rows = not tested) Comments: more pain with lowering  B from flexion   UPPER EXTREMITY MMT:   MMT Right eval Left eval  Shoulder flexion 4 4p!  Shoulder extension      Shoulder abduction 4p! 4p!  Shoulder extension      Shoulder internal rotation 4+ 5  Shoulder external rotation 4- 3+  Elbow flexion 5 (R shoulder discomfort) 5  Elbow extension 5 5 (L triceps discomfort)  Grip strength       (Blank rows = not tested)    PALPATION:  Concordant pain with trigger points in R supraspinatus, infraspinatus, levator scap, and proximal biceps. Supraspinatus trigger point refers down arm L side: most tenderness in infraspinatus and deltoid             TODAY'S TREATMENT:                                                                                                    [facility] Adult PT Treatment:                                                DATE: 02/08/22 Therapeutic Exercise: UBE L3 x 5 min  (FWD/BWD 2:30 ea) Upper trap stretch 1 x 30 bil  Prone I's, T's and Y's 2 x 12 with 1# LUE only Prone W's bil unweighted 2 x 12 Scaption 2 x 10 with 2# bil Shoulder IR/ ER 2 x 12 with GTB - tactile cues to avoid compensation  Updated HEP to include prone I's, T's,  and Y's   [facility] Adult PT Treatment:                                                DATE: 02/01/22 Therapeutic Exercise: UBE L2 2.5 min each way  Lower trap strengthening with elbows propped on bolster 2 x 12 with RTB , 3rd set with Green  Horizontal Shoulder abduction 10 x 2 red band , seated - increased left shoulder pain Supine horizontals with green band Supine alternating diagonals 10 x 2  Supine protraction holding 5# x 20  Manual Therapy: GHJ mobs with PROM left shoulder STW Left deltoid upper arm    [facility] Adult PT Treatment:                                                DATE: 01/27/2022 Therapeutic Exercise: UBE L 5 x 5 min (FWD/ BWD 2:30) L upper trap stretch 2 x 30 sec Push up with plus 2 x 10  Scaption 2 x 12 with 2#,  Lower trap strengthening with elbows propped on  bolster 2 x 12 with RTB  Seated horizontal abduction 2 x 10 with RTB Supine Landmine press 1 x 10 with 5#  Updated HEP to include scaption, upper trap stretch  Manual Therapy: MTPR along the R upper trap x 3                                   [facility] Adult PT Treatment:                                                DATE: 01/18/22 Therapeutic Exercise: Scapular retraction x10 cues for HEP Shoulder ER isometric x5 B UE cues for HEP     PATIENT EDUCATION: Education details: Pt education on PT impairments, prognosis, and POC. Informed consent. Rationale for interventions, safe/appropriate HEP performance Person educated: Patient Education method: Explanation, Demonstration, Tactile cues, Verbal cues, and Handouts Education comprehension: verbalized understanding, returned demonstration, verbal cues required, tactile cues required, and needs further education     HOME EXERCISE PROGRAM: Access Code: [email or web address] URL: [email or web address] Date: 02/08/2022 Prepared by: Starr Lake  Exercises - Standing Isometric Shoulder External Rotation with Doorway  - 1 x daily - 7 x weekly - 3 sets - 5 reps - Seated Scapular Retraction  - 1 x daily - 7 x weekly - 3 sets - 10 reps - Seated Upper Trapezius Stretch (Mirrored)  - 2 x daily - 7 x weekly - 2 sets - 2 reps - 30 seconds hold - Standing Shoulder Scaption  - 1 x daily - 7 x weekly - 2 sets - 10 reps - Wall Push Up with Plus  - 1 x daily - 7 x weekly - 2 sets - 10 reps - 5 hold - Supine Bilateral Shoulder Protraction  - 1 x daily - 7 x weekly - 2 sets - 10 reps - Shoulder I's  - 1 x daily - 7 x weekly - 2 sets - 12 reps - Shoulder  T's  - 1 x daily - 7 x weekly - 2 sets - 12 reps - Shoulder Y's  - 1 x daily - 7 x weekly - 2 sets - 12 reps   ASSESSMENT:   CLINICAL IMPRESSION: Mrs Olvera reports she received a injection in the L shoulder since the last session and notes significant improvement in pain. Continued working on  posterior chain strengthening and rotator cuff activation. Overall she did very well but did fatigue with ER. She reported no pain during or following session.     OBJECTIVE IMPAIRMENTS: decreased activity tolerance, decreased mobility, decreased ROM, decreased strength, impaired sensation, impaired UE functional use, and pain.    ACTIVITY LIMITATIONS: carrying, lifting, sleeping, reach over head, and hygiene/grooming   PARTICIPATION LIMITATIONS: meal prep, cleaning, laundry, driving, shopping, community activity, occupation, and yard work   PERSONAL FACTORS: Time since onset of injury/illness/exacerbation are also affecting patient's functional outcome.    REHAB POTENTIAL: Good   CLINICAL DECISION MAKING: Stable/uncomplicated   EVALUATION COMPLEXITY: Low     GOALS: Goals reviewed with patient? No   SHORT TERM GOALS: Target date: 02/15/2022     Pt will demonstrate appropriate understanding and performance of initially prescribed HEP in order to facilitate improved independence with management of symptoms.  Baseline: HEP provided on eval Goal status: MET 02/01/22   2. Pt will score greater than or equal to 73 on FOTO in order to demonstrate improved perception of function due to symptoms.            Baseline: 71            Goal status: INITIAL    LONG TERM GOALS: Target date: 03/15/2022       Pt will score 75 on FOTO in order to demonstrate improved perception of function due to symptoms. Baseline: 71 Goal status: INITIAL   2.  Pt will demonstrate independence with final HEP to facilitate improved long term management of symptoms. Baseline: initial HEP prescribed Goal status: INITIAL   3.  Pt will demonstrate at least 4/5 and grossly symmetrical shoulder MMT B for improved symmetry of UE strength and improved tolerance to functional movements.  Baseline: see MMT chart above Goal status: INITIAL   4. Pt will report/demonstrate ability to perform typical household  activities with less than 2 point increase in pain on NPS in order to indicative improved tolerance/independence for functional tasks.             Baseline: up to 5/10 on NPS with household activities            Goal status: INITIAL    5. Pt will demonstrate grossly WFL shoulder elevation B with less than 2/10 pain on NPS for improved tolerance to overhead tasks.            Baseline: pain with lowering from elevation B, see ROM chart above            Goal status: initial   PLAN:   PT FREQUENCY: 2x/week   PT DURATION: 6 weeks   PLANNED INTERVENTIONS: Therapeutic exercises, Therapeutic activity, Neuromuscular re-education, Balance training, Gait training, Patient/Family education, Self Care, Joint mobilization, Dry Needling, Electrical stimulation, Spinal mobilization, Cryotherapy, Moist heat, Taping, Manual therapy, and Re-evaluation   PLAN FOR NEXT SESSION: Progress ROM/strengthening exercises as able/appropriate, review HEP. Assess upper limb tension (pt notes some numbness with overhead presses at gym today). Could benefit from more biceps/rotator cuff work. Response to ER/IR and updated HEP next session if tolerated  well.   Starr Lake PT, DPT, LAT, ATC  02/08/22  11:07 AM

## 2022-02-09 NOTE — Therapy (Signed)
OUTPATIENT PHYSICAL THERAPY TREATMENT NOTE   Patient Name: Jean Pittman MRN: [id number] DOB:Dec 21, 1960, 61 y.o., female Today's Date: 02/10/2022  PCP: Binnie Rail, MD   REFERRING PROVIDER: Garald Balding, MD  END OF SESSION:   PT End of Session - 02/10/22 1011     Visit Number 6    Number of Visits 13    Date for PT Re-Evaluation 03/15/22    Authorization Type [employer]    Authorization - Number of Visits 30   annual limit   PT Start Time 1012    PT Stop Time 1056    PT Time Calculation (min) 44 min    Activity Tolerance Patient tolerated treatment well;No increased pain    Behavior During Therapy [facility] for tasks assessed/performed                Past Medical History:  Diagnosis Date   Arthritis    Dizziness    Hyperlipidemia    denies   Tubular adenoma of colon 03/30/09   Dr Sharlett Iles   Past Surgical History:  Procedure Laterality Date   APPENDECTOMY     with 1st pregnancy   BRAVO [place] STUDY N/A 11/30/2017   Procedure: BRAVO [place];  Surgeon: Milus Banister, MD;  Location: [facility];  Service: Endoscopy;  Laterality: N/A;   BREAST BIOPSY     needle aspiration X1; resection X 1   colonoscopy with polypectomy  03/30/2009   Dr Sharlett Iles   CT SCAN  2017   Chest due to a possible blood clot but came back normal   ESOPHAGOGASTRODUODENOSCOPY (EGD) WITH PROPOFOL N/A 11/30/2017   Procedure: ESOPHAGOGASTRODUODENOSCOPY (EGD) WITH PROPOFOL;  Surgeon: Milus Banister, MD;  Location: [facility];  Service: Endoscopy;  Laterality: N/A;  48 hour Ph probe   frozen shoulder Left    broke the cartilage-was put to sleep Dr. Joni Fears apox 2014   G 3 P 3     3 C sections   TUBAL LIGATION     Patient Active Problem List   Diagnosis Date Noted   Impingement syndrome of left shoulder 02/02/2022   Acute pain of right shoulder 11/23/2021   Decreased hearing of right ear 01/19/2021   Acute non-recurrent sinusitis 01/19/2021   Hyperglycemia 09/20/2018   Upper  airway cough syndrome 01/10/2017   Chronic cough 03/22/2016   EKG abnormalities 02/15/2012   ADENOMATOUS COLONIC POLYP 04/16/2010   REACTIVE AIRWAY DISEASE 04/16/2010   Hyperlipidemia 03/12/2009    REFERRING DIAG: M25.511 (ICD-10-CM) - Acute pain of right shoulder   THERAPY DIAG:  Right shoulder pain, unspecified chronicity  Left shoulder pain, unspecified chronicity  Muscle weakness (generalized)  Stiffness of right shoulder, not elsewhere classified  Rationale for Evaluation and Treatment Rehabilitation  PERTINENT HISTORY: osteopenia; self employed, typically quite active  PRECAUTIONS: osteopenia  SUBJECTIVE:  SUBJECTIVE STATEMENT:   Pt continues to endorse excellent symptom relief since steroid injection. Reports soreness/fatigue after last session. No other new updates  PAIN:   Are you having pain: 0/10 Location: R scapula, lateral shoulder; L side anterior shoulder into bicep How would you describe your pain? discomfort Best in past week: 0/10 Worst in past week: 5/10 Aggravating factors: exercise, lifting overhead, heavy lifting/carrying Easing factors: medication, rest   OBJECTIVE: (objective measures completed at initial evaluation unless otherwise dated)   DIAGNOSTIC FINDINGS:  X rays unremarkable per referring MD notes and per pt    PATIENT SURVEYS:  FOTO: 71% eval, 75% predicted FOTO 02/10/22 75%    COGNITION: Overall cognitive status: Within functional limits for tasks assessed                                  SENSATION: Light touch intact B UE and LE, aside from subjective increase in sensitivity R C5 compared to L   POSTURE: Grossly WNL   UPPER EXTREMITY ROM:   Active ROM Right eval Left eval  Shoulder flexion 165 162  Shoulder abduction 160 155  Shoulder  internal rotation      Shoulder external rotation      Elbow flexion      Elbow extension      Wrist flexion      Wrist extension       (Blank rows = not tested) Comments: more pain with lowering B from flexion   UPPER EXTREMITY MMT:   MMT Right eval Left eval R/L 02/10/22  Shoulder flexion 4 4p! 4+/4+ (mild pain B)  Shoulder extension       Shoulder abduction 4p! 4p! 4+/4+ (no pain)  Shoulder extension       Shoulder internal rotation 4+ 5 5/5  Shoulder external rotation 4- 3+ 4/4-  Elbow flexion 5 (R shoulder discomfort) 5   Elbow extension 5 5 (L triceps discomfort)   Grip strength        (Blank rows = not tested)    PALPATION:  Concordant pain with trigger points in R supraspinatus, infraspinatus, levator scap, and proximal biceps. Supraspinatus trigger point refers down arm L side: most tenderness in infraspinatus and deltoid             TODAY'S TREATMENT:                                                                                                   [facility] Adult PT Treatment:                                                DATE: 02/10/22 Therapeutic Exercise: Cable column row 7# each UE x8 x12, cues for form and appropriate setup; 10# each UE x10 Cable column shoulder ext 3# each UE x12, 7# each x8 GTB ER walkout 2x5 each UE, cues for form and posture Standing  swiss ball flexion at table x15, cues for form and pacing  Scaption AAROM w/ cane x8 each  Significant time w/ education re: exercise physiology, rationale for intervention, and relevant anatomy/physiology with activity, HEP review    [facility] Adult PT Treatment:                                                DATE: 02/08/22 Therapeutic Exercise: UBE L3 x 5 min  (FWD/BWD 2:30 ea) Upper trap stretch 1 x 30 bil  Prone I's, T's and Y's 2 x 12 with 1# LUE only Prone W's bil unweighted 2 x 12 Scaption 2 x 10 with 2# bil Shoulder IR/ ER 2 x 12 with GTB - tactile cues to avoid compensation  Updated HEP to include  prone I's, T's, and Y's   [facility] Adult PT Treatment:                                                DATE: 02/01/22 Therapeutic Exercise: UBE L2 2.5 min each way  Lower trap strengthening with elbows propped on bolster 2 x 12 with RTB , 3rd set with Green  Horizontal Shoulder abduction 10 x 2 red band , seated - increased left shoulder pain Supine horizontals with green band Supine alternating diagonals 10 x 2  Supine protraction holding 5# x 20  Manual Therapy: GHJ mobs with PROM left shoulder STW Left deltoid upper arm      PATIENT EDUCATION: Education details: rationale for interventions, FOTO, progress w/ PT thus far Person educated: Patient Education method: Explanation, Demonstration, Tactile cues, Verbal cues Education comprehension: verbalized understanding, returned demonstration, verbal cues required, tactile cues required, and needs further education     HOME EXERCISE PROGRAM: Access Code: [email or web address] URL: [email or web address] Date: 02/08/2022 Prepared by: Starr Lake  Exercises - Standing Isometric Shoulder External Rotation with Doorway  - 1 x daily - 7 x weekly - 3 sets - 5 reps - Seated Scapular Retraction  - 1 x daily - 7 x weekly - 3 sets - 10 reps - Seated Upper Trapezius Stretch (Mirrored)  - 2 x daily - 7 x weekly - 2 sets - 2 reps - 30 seconds hold - Standing Shoulder Scaption  - 1 x daily - 7 x weekly - 2 sets - 10 reps - Wall Push Up with Plus  - 1 x daily - 7 x weekly - 2 sets - 10 reps - 5 hold - Supine Bilateral Shoulder Protraction  - 1 x daily - 7 x weekly - 2 sets - 10 reps - Shoulder I's  - 1 x daily - 7 x weekly - 2 sets - 12 reps - Shoulder T's  - 1 x daily - 7 x weekly - 2 sets - 12 reps - Shoulder Y's  - 1 x daily - 7 x weekly - 2 sets - 12 reps   ASSESSMENT:   CLINICAL IMPRESSION: Pt arrives w/o significant pain, reports fatigue/soreness after last session. Continues to tolerate activity well without increases in pain, able to  progress for cable column periscapular strengthening for more consistent resistance. Also able to progress for increased emphasis on endurance w/ GH exercise. Primary report of muscular fatigue, no increase in  pain/discomfort, no adverse events. Pt departs today's session in no acute distress, all voiced questions/concerns addressed appropriately from PT perspective.       OBJECTIVE IMPAIRMENTS: decreased activity tolerance, decreased mobility, decreased ROM, decreased strength, impaired sensation, impaired UE functional use, and pain.    ACTIVITY LIMITATIONS: carrying, lifting, sleeping, reach over head, and hygiene/grooming   PARTICIPATION LIMITATIONS: meal prep, cleaning, laundry, driving, shopping, community activity, occupation, and yard work   PERSONAL FACTORS: Time since onset of injury/illness/exacerbation are also affecting patient's functional outcome.    REHAB POTENTIAL: Good   CLINICAL DECISION MAKING: Stable/uncomplicated   EVALUATION COMPLEXITY: Low     GOALS: Goals reviewed with patient? No   SHORT TERM GOALS: Target date: 02/15/2022     Pt will demonstrate appropriate understanding and performance of initially prescribed HEP in order to facilitate improved independence with management of symptoms.  Baseline: HEP provided on eval Goal status: MET 02/01/22   2. Pt will score greater than or equal to 73 on FOTO in order to demonstrate improved perception of function due to symptoms.            Baseline: 71  02/10/22: 75%            Goal status: MET   LONG TERM GOALS: Target date: 03/15/2022       Pt will score 75 on FOTO in order to demonstrate improved perception of function due to symptoms. Baseline: 71 02/10/22: 75 Goal status: MET   2.  Pt will demonstrate independence with final HEP to facilitate improved long term management of symptoms. Baseline: initial HEP prescribed Goal status: INITIAL   3.  Pt will demonstrate at least 4/5 and grossly symmetrical  shoulder MMT B for improved symmetry of UE strength and improved tolerance to functional movements.  Baseline: see MMT chart above Goal status: INITIAL   4. Pt will report/demonstrate ability to perform typical household activities with less than 2 point increase in pain on NPS in order to indicative improved tolerance/independence for functional tasks.             Baseline: up to 5/10 on NPS with household activities            Goal status: INITIAL    5. Pt will demonstrate grossly WFL shoulder elevation B with less than 2/10 pain on NPS for improved tolerance to overhead tasks.            Baseline: pain with lowering from elevation B, see ROM chart above            Goal status: initial   PLAN:   PT FREQUENCY: 2x/week   PT DURATION: 6 weeks   PLANNED INTERVENTIONS: Therapeutic exercises, Therapeutic activity, Neuromuscular re-education, Balance training, Gait training, Patient/Family education, Self Care, Joint mobilization, Dry Needling, Electrical stimulation, Spinal mobilization, Cryotherapy, Moist heat, Taping, Manual therapy, and Re-evaluation   PLAN FOR NEXT SESSION: progress ROM/strengthening as able/tolerated. Consider HEP review/update  Leeroy Cha PT, DPT 02/10/2022 11:00 AM

## 2022-02-10 ENCOUNTER — Ambulatory Visit: Payer: Commercial Managed Care - HMO | Admitting: Physical Therapy

## 2022-02-10 ENCOUNTER — Encounter: Payer: Self-pay | Admitting: Physical Therapy

## 2022-02-10 DIAGNOSIS — M25512 Pain in left shoulder: Secondary | ICD-10-CM

## 2022-02-10 DIAGNOSIS — M25511 Pain in right shoulder: Secondary | ICD-10-CM

## 2022-02-10 DIAGNOSIS — M6281 Muscle weakness (generalized): Secondary | ICD-10-CM

## 2022-02-10 DIAGNOSIS — M25611 Stiffness of right shoulder, not elsewhere classified: Secondary | ICD-10-CM

## 2022-02-14 NOTE — Therapy (Signed)
OUTPATIENT PHYSICAL THERAPY TREATMENT NOTE   Patient Name: Jean Pittman MRN: [id number] DOB:[birth year], 61 y.o., female Today's Date: 02/15/2022  PCP: Binnie Rail, MD   REFERRING PROVIDER: Garald Balding, MD  END OF SESSION:   PT End of Session - 02/15/22 1014     Visit Number 7    Number of Visits 13    Date for PT Re-Evaluation 03/15/22    Authorization Type [employer]    Authorization - Number of Visits 30   annual limit   PT Start Time 7893    PT Stop Time 1059    PT Time Calculation (min) 45 min    Activity Tolerance Patient tolerated treatment well;No increased pain    Behavior During Therapy [facility] for tasks assessed/performed                 Past Medical History:  Diagnosis Date   Arthritis    Dizziness    Hyperlipidemia    denies   Tubular adenoma of colon 03/30/09   Dr Sharlett Iles   Past Surgical History:  Procedure Laterality Date   APPENDECTOMY     with 1st pregnancy   BRAVO [place] STUDY N/A 11/30/2017   Procedure: BRAVO [place];  Surgeon: Milus Banister, MD;  Location: [facility];  Service: Endoscopy;  Laterality: N/A;   BREAST BIOPSY     needle aspiration X1; resection X 1   colonoscopy with polypectomy  03/30/2009   Dr Sharlett Iles   CT SCAN  2017   Chest due to a possible blood clot but came back normal   ESOPHAGOGASTRODUODENOSCOPY (EGD) WITH PROPOFOL N/A 11/30/2017   Procedure: ESOPHAGOGASTRODUODENOSCOPY (EGD) WITH PROPOFOL;  Surgeon: Milus Banister, MD;  Location: [facility];  Service: Endoscopy;  Laterality: N/A;  48 hour Ph probe   frozen shoulder Left    broke the cartilage-was put to sleep Dr. Joni Fears apox 2014   G 3 P 3     3 C sections   TUBAL LIGATION     Patient Active Problem List   Diagnosis Date Noted   Impingement syndrome of left shoulder 02/02/2022   Acute pain of right shoulder 11/23/2021   Decreased hearing of right ear 01/19/2021   Acute non-recurrent sinusitis 01/19/2021   Hyperglycemia 09/20/2018    Upper airway cough syndrome 01/10/2017   Chronic cough 03/22/2016   EKG abnormalities 02/15/2012   ADENOMATOUS COLONIC POLYP 04/16/2010   REACTIVE AIRWAY DISEASE 04/16/2010   Hyperlipidemia 03/12/2009    REFERRING DIAG: M25.511 (ICD-10-CM) - Acute pain of right shoulder   THERAPY DIAG:  Right shoulder pain, unspecified chronicity  Left shoulder pain, unspecified chronicity  Muscle weakness (generalized)  Stiffness of right shoulder, not elsewhere classified  Rationale for Evaluation and Treatment Rehabilitation  PERTINENT HISTORY: osteopenia; self employed, typically quite active  PRECAUTIONS: osteopenia  SUBJECTIVE:  SUBJECTIVE STATEMENT:   Pt continues to report improved symptoms overall since injection, primary complaint of muscular fatigue. Some aching with changes in weather. Reports good compliance w/ HEP.    PAIN:   Are you having pain: 0/10 Location: R scapula, lateral shoulder; L side anterior shoulder into bicep How would you describe your pain? discomfort Best in past week: 0/10 Worst in past week: 5/10 Aggravating factors: exercise, lifting overhead, heavy lifting/carrying Easing factors: medication, rest   OBJECTIVE: (objective measures completed at initial evaluation unless otherwise dated)   DIAGNOSTIC FINDINGS:  X rays unremarkable per referring MD notes and per pt    PATIENT SURVEYS:  FOTO: 71% eval, 75% predicted FOTO 02/10/22 75%    COGNITION: Overall cognitive status: Within functional limits for tasks assessed                                  SENSATION: Light touch intact B UE and LE, aside from subjective increase in sensitivity R C5 compared to L   POSTURE: Grossly WNL   UPPER EXTREMITY ROM:   Active ROM Right eval Left eval  Shoulder flexion 165 162   Shoulder abduction 160 155  Shoulder internal rotation      Shoulder external rotation      Elbow flexion      Elbow extension      Wrist flexion      Wrist extension       (Blank rows = not tested) Comments: more pain with lowering B from flexion   UPPER EXTREMITY MMT:   MMT Right eval Left eval R/L 02/10/22  Shoulder flexion 4 4p! 4+/4+ (mild pain B)  Shoulder extension       Shoulder abduction 4p! 4p! 4+/4+ (no pain)  Shoulder extension       Shoulder internal rotation 4+ 5 5/5  Shoulder external rotation 4- 3+ 4/4-  Elbow flexion 5 (R shoulder discomfort) 5   Elbow extension 5 5 (L triceps discomfort)   Grip strength        (Blank rows = not tested)    PALPATION:  Concordant pain with trigger points in R supraspinatus, infraspinatus, levator scap, and proximal biceps. Supraspinatus trigger point refers down arm L side: most tenderness in infraspinatus and deltoid             TODAY'S TREATMENT:                                                                                                   [facility] Adult PT Treatment:                                                DATE: 02/15/22 Therapeutic Exercise: UBE 58mn fwd 238m back during subjective 10# cable column row 2x10 cues for form and posture 3# wide row cable column 2x8 cues for form and posture Blue theraband ER walkout B  x5 each, cues for posture and elbow positioning Green TB 3x8 cues for form and scapular positioning  2# waiters carry x38f each, 5# [id number] for scapular positioning and pacing  5# serratus punch supine x12, 8# x12, 10# x12 cues for form and reduced compensation at elbow HEP review   [facility] Treatment:                                                DATE: 02/10/22 Therapeutic Exercise: Cable column row 7# each UE x8 x12, cues for form and appropriate setup; 10# each UE x10 Cable column shoulder ext 3# each UE x12, 7# each x8 GTB ER walkout 2x5 each UE, cues for form and posture Standing  swiss ball flexion at table x15, cues for form and pacing  Scaption AAROM w/ cane x8 each  Significant time w/ education re: exercise physiology, rationale for intervention, and relevant anatomy/physiology with activity, HEP review    [facility] Adult PT Treatment:                                                DATE: 02/08/22 Therapeutic Exercise: UBE L3 x 5 min  (FWD/BWD 2:30 ea) Upper trap stretch 1 x 30 bil  Prone I's, T's and Y's 2 x 12 with 1# LUE only Prone W's bil unweighted 2 x 12 Scaption 2 x 10 with 2# bil Shoulder IR/ ER 2 x 12 with GTB - tactile cues to avoid compensation  Updated HEP to include prone I's, T's, and Y's   [facility] Adult PT Treatment:                                                DATE: 02/01/22 Therapeutic Exercise: UBE L2 2.5 min each way  Lower trap strengthening with elbows propped on bolster 2 x 12 with RTB , 3rd set with Green  Horizontal Shoulder abduction 10 x 2 red band , seated - increased left shoulder pain Supine horizontals with green band Supine alternating diagonals 10 x 2  Supine protraction holding 5# x 20  Manual Therapy: GHJ mobs with PROM left shoulder STW Left deltoid upper arm      PATIENT EDUCATION: Education details: rationale for interventions, FOTO, progress w/ PT thus far Person educated: Patient Education method: Explanation, Demonstration, Tactile cues, Verbal cues Education comprehension: verbalized understanding, returned demonstration, verbal cues required, tactile cues required, and needs further education     HOME EXERCISE PROGRAM: Access Code: [email or web address]: [email or web address] Date: 02/08/2022 Prepared by: KrStarr LakeExercises - Standing Isometric Shoulder External Rotation with Doorway  - 1 x daily - 7 x weekly - 3 sets - 5 reps - Seated Scapular Retraction  - 1 x daily - 7 x weekly - 3 sets - 10 reps - Seated Upper Trapezius Stretch (Mirrored)  - 2 x daily - 7 x weekly - 2 sets - 2 reps - 30  seconds hold - Standing Shoulder Scaption  - 1 x daily - 7 x weekly - 2 sets - 10 reps - Wall Push Up with Plus  -  1 x daily - 7 x weekly - 2 sets - 10 reps - 5 hold - Supine Bilateral Shoulder Protraction  - 1 x daily - 7 x weekly - 2 sets - 10 reps - Shoulder I's  - 1 x daily - 7 x weekly - 2 sets - 12 reps - Shoulder T's  - 1 x daily - 7 x weekly - 2 sets - 12 reps - Shoulder Y's  - 1 x daily - 7 x weekly - 2 sets - 12 reps   ASSESSMENT:   CLINICAL IMPRESSION: Pt arrives without significant pain, primary complaint of muscular fatigue in shoulder. Continued to progress familiar exercises for repetition/resistance where able, also introduced more eye level/overhead movement with good tolerance, cues as above. Tolerates session quite well, no adverse events. Pt departs today's session in no acute distress, all voiced questions/concerns addressed appropriately from PT perspective.       OBJECTIVE IMPAIRMENTS: decreased activity tolerance, decreased mobility, decreased ROM, decreased strength, impaired sensation, impaired UE functional use, and pain.    ACTIVITY LIMITATIONS: carrying, lifting, sleeping, reach over head, and hygiene/grooming   PARTICIPATION LIMITATIONS: meal prep, cleaning, laundry, driving, shopping, community activity, occupation, and yard work   PERSONAL FACTORS: Time since onset of injury/illness/exacerbation are also affecting patient's functional outcome.    REHAB POTENTIAL: Good   CLINICAL DECISION MAKING: Stable/uncomplicated   EVALUATION COMPLEXITY: Low     GOALS: Goals reviewed with patient? No   SHORT TERM GOALS: Target date: 02/15/2022     Pt will demonstrate appropriate understanding and performance of initially prescribed HEP in order to facilitate improved independence with management of symptoms.  Baseline: HEP provided on eval Goal status: MET 02/01/22   2. Pt will score greater than or equal to 73 on FOTO in order to demonstrate improved  perception of function due to symptoms.            Baseline: 71  02/10/22: 75%            Goal status: MET   LONG TERM GOALS: Target date: 03/15/2022       Pt will score 75 on FOTO in order to demonstrate improved perception of function due to symptoms. Baseline: 71 02/10/22: 75 Goal status: MET   2.  Pt will demonstrate independence with final HEP to facilitate improved long term management of symptoms. Baseline: initial HEP prescribed Goal status: INITIAL   3.  Pt will demonstrate at least 4/5 and grossly symmetrical shoulder MMT B for improved symmetry of UE strength and improved tolerance to functional movements.  Baseline: see MMT chart above Goal status: INITIAL   4. Pt will report/demonstrate ability to perform typical household activities with less than 2 point increase in pain on NPS in order to indicative improved tolerance/independence for functional tasks.             Baseline: up to 5/10 on NPS with household activities            Goal status: INITIAL    5. Pt will demonstrate grossly WFL shoulder elevation B with less than 2/10 pain on NPS for improved tolerance to overhead tasks.            Baseline: pain with lowering from elevation B, see ROM chart above            Goal status: initial   PLAN:   PT FREQUENCY: 2x/week   PT DURATION: 6 weeks   PLANNED INTERVENTIONS: Therapeutic exercises, Therapeutic activity, Neuromuscular re-education,  Balance training, Gait training, Patient/Family education, Self Care, Joint mobilization, Dry Needling, Electrical stimulation, Spinal mobilization, Cryotherapy, Moist heat, Taping, Manual therapy, and Re-evaluation   PLAN FOR NEXT SESSION:  progress ROM/strengthening as able/tolerated. Emphasis on ER and serratus. Consider HEP review/update  Leeroy Cha PT, DPT 02/15/2022 11:00 AM

## 2022-02-15 ENCOUNTER — Ambulatory Visit: Payer: Commercial Managed Care - HMO | Admitting: Physical Therapy

## 2022-02-15 ENCOUNTER — Encounter: Payer: Self-pay | Admitting: Physical Therapy

## 2022-02-15 DIAGNOSIS — M6281 Muscle weakness (generalized): Secondary | ICD-10-CM

## 2022-02-15 DIAGNOSIS — M25511 Pain in right shoulder: Secondary | ICD-10-CM | POA: Diagnosis not present

## 2022-02-15 DIAGNOSIS — M25611 Stiffness of right shoulder, not elsewhere classified: Secondary | ICD-10-CM

## 2022-02-15 DIAGNOSIS — M25512 Pain in left shoulder: Secondary | ICD-10-CM

## 2022-02-16 NOTE — Therapy (Signed)
OUTPATIENT PHYSICAL THERAPY TREATMENT NOTE   Patient Name: Jean Pittman MRN: [id number] DOB:07-Aug-1960, 61 y.o., female Today's Date: 02/17/2022  PCP: Binnie Rail, MD   REFERRING PROVIDER: Garald Balding, MD  END OF SESSION:   PT End of Session - 02/17/22 1012     Visit Number 8    Number of Visits 13    Date for PT Re-Evaluation 03/15/22    Authorization Type [employer]    Authorization - Visit Number 3    Authorization - Number of Visits 30   annual limit   PT Start Time 5732    PT Stop Time 1102    PT Time Calculation (min) 47 min    Activity Tolerance Patient tolerated treatment well;No increased pain    Behavior During Therapy [facility] for tasks assessed/performed                  Past Medical History:  Diagnosis Date   Arthritis    Dizziness    Hyperlipidemia    denies   Tubular adenoma of colon 03/30/09   Dr Sharlett Iles   Past Surgical History:  Procedure Laterality Date   APPENDECTOMY     with 1st pregnancy   BRAVO [place] STUDY N/A 11/30/2017   Procedure: BRAVO [place];  Surgeon: Milus Banister, MD;  Location: [facility];  Service: Endoscopy;  Laterality: N/A;   BREAST BIOPSY     needle aspiration X1; resection X 1   colonoscopy with polypectomy  03/30/2009   Dr Sharlett Iles   CT SCAN  2017   Chest due to a possible blood clot but came back normal   ESOPHAGOGASTRODUODENOSCOPY (EGD) WITH PROPOFOL N/A 11/30/2017   Procedure: ESOPHAGOGASTRODUODENOSCOPY (EGD) WITH PROPOFOL;  Surgeon: Milus Banister, MD;  Location: [facility];  Service: Endoscopy;  Laterality: N/A;  48 hour Ph probe   frozen shoulder Left    broke the cartilage-was put to sleep Dr. Joni Fears apox 2014   G 3 P 3     3 C sections   TUBAL LIGATION     Patient Active Problem List   Diagnosis Date Noted   Impingement syndrome of left shoulder 02/02/2022   Acute pain of right shoulder 11/23/2021   Decreased hearing of right ear 01/19/2021   Acute non-recurrent sinusitis  01/19/2021   Hyperglycemia 09/20/2018   Upper airway cough syndrome 01/10/2017   Chronic cough 03/22/2016   EKG abnormalities 02/15/2012   ADENOMATOUS COLONIC POLYP 04/16/2010   REACTIVE AIRWAY DISEASE 04/16/2010   Hyperlipidemia 03/12/2009    REFERRING DIAG: M25.511 (ICD-10-CM) - Acute pain of right shoulder   THERAPY DIAG:  Right shoulder pain, unspecified chronicity  Left shoulder pain, unspecified chronicity  Muscle weakness (generalized)  Stiffness of right shoulder, not elsewhere classified  Rationale for Evaluation and Treatment Rehabilitation  PERTINENT HISTORY: osteopenia; self employed, typically quite active  PRECAUTIONS: osteopenia  SUBJECTIVE:  SUBJECTIVE STATEMENT:   Pt arrives without issue, states she has been doing heavier activity with good response. No significant issues since last session. No overt pain since last injection.    PAIN:   Are you having pain: 0/10 Location: R scapula, lateral shoulder; L side anterior shoulder into bicep How would you describe your pain? discomfort Best in past week: 0/10 Worst in past week: 5/10 Aggravating factors: exercise, lifting overhead, heavy lifting/carrying Easing factors: medication, rest   OBJECTIVE: (objective measures completed at initial evaluation unless otherwise dated)   DIAGNOSTIC FINDINGS:  X rays unremarkable per referring MD notes and per pt    PATIENT SURVEYS:  FOTO: 71% eval, 75% predicted FOTO 02/10/22 75%    COGNITION: Overall cognitive status: Within functional limits for tasks assessed                                  SENSATION: Light touch intact B UE and LE, aside from subjective increase in sensitivity R C5 compared to L   POSTURE: Grossly WNL   UPPER EXTREMITY ROM:   Active ROM Right eval  Left eval  Shoulder flexion 165 162  Shoulder abduction 160 155  Shoulder internal rotation      Shoulder external rotation      Elbow flexion      Elbow extension      Wrist flexion      Wrist extension       (Blank rows = not tested) Comments: more pain with lowering B from flexion   UPPER EXTREMITY MMT:   MMT Right eval Left eval R/L 02/10/22  Shoulder flexion 4 4p! 4+/4+ (mild pain B)  Shoulder extension       Shoulder abduction 4p! 4p! 4+/4+ (no pain)  Shoulder extension       Shoulder internal rotation 4+ 5 5/5  Shoulder external rotation 4- 3+ 4/4-  Elbow flexion 5 (R shoulder discomfort) 5   Elbow extension 5 5 (L triceps discomfort)   Grip strength        (Blank rows = not tested)    PALPATION:  Concordant pain with trigger points in R supraspinatus, infraspinatus, levator scap, and proximal biceps. Supraspinatus trigger point refers down arm L side: most tenderness in infraspinatus and deltoid             TODAY'S TREATMENT:                                                                                                  [facility] Adult PT Treatment:                                                DATE: 02/17/22 Therapeutic Exercise: UBE fwd/back 68mn each during subjective Bent row + tricep kickback 5# 2x10 cues for setup and sequencing 5# waiter's carry KB, 2x525fcues for form and setup Reactive swiss ball perturbations 2x30sec  each UE Bird dog 2x12 cues for trunk alignment and posture  DB 3 way raises (lateral, scap, flex) x5 w 2#, x5 3# cues for form, shoulder alignment   Self Care: Significant time spent discussing activity modification, exercises in group settings, monitoring of symptoms and methods to accommodate exercises and home activities    [facility] Adult PT Treatment:                                                DATE: 02/15/22 Therapeutic Exercise: UBE 37mn fwd 257m back during subjective 10# cable column row 2x10 cues for form and posture 3# wide row  cable column 2x8 cues for form and posture Blue theraband ER walkout B x5 each, cues for posture and elbow positioning Green TB 3x8 cues for form and scapular positioning  2# waiters carry [id number], 5# [id number] for scapular positioning and pacing  5# serratus punch supine x12, 8# x12, 10# x12 cues for form and reduced compensation at elbow HEP review   [facility] Adult PT Treatment:                                                DATE: 02/10/22 Therapeutic Exercise: Cable column row 7# each UE x8 x12, cues for form and appropriate setup; 10# each UE x10 Cable column shoulder ext 3# each UE x12, 7# each x8 GTB ER walkout 2x5 each UE, cues for form and posture Standing swiss ball flexion at table x15, cues for form and pacing  Scaption AAROM w/ cane x8 each  Significant time w/ education re: exercise physiology, rationale for intervention, and relevant anatomy/physiology with activity, HEP review    PATIENT EDUCATION: Education details: rationale for interventions, relevant anatomy/physiology Person educated: Patient Education method: Explanation, Demonstration, Tactile cues, Verbal cues Education comprehension: verbalized understanding, returned demonstration, verbal cues required, tactile cues required, and needs further education     HOME EXERCISE PROGRAM: Access Code: [email or web address] URL: [email or web address] Date: 02/08/2022 Prepared by: KrisStarr Lakeercises - Standing Isometric Shoulder External Rotation with Doorway  - 1 x daily - 7 x weekly - 3 sets - 5 reps - Seated Scapular Retraction  - 1 x daily - 7 x weekly - 3 sets - 10 reps - Seated Upper Trapezius Stretch (Mirrored)  - 2 x daily - 7 x weekly - 2 sets - 2 reps - 30 seconds hold - Standing Shoulder Scaption  - 1 x daily - 7 x weekly - 2 sets - 10 reps - Wall Push Up with Plus  - 1 x daily - 7 x weekly - 2 sets - 10 reps - 5 hold - Supine Bilateral Shoulder Protraction  - 1 x daily - 7 x weekly - 2 sets -  10 reps - Shoulder I's  - 1 x daily - 7 x weekly - 2 sets - 12 reps - Shoulder T's  - 1 x daily - 7 x weekly - 2 sets - 12 reps - Shoulder Y's  - 1 x daily - 7 x weekly - 2 sets - 12 reps   ASSESSMENT:   CLINICAL IMPRESSION: Pt arrives w/o pain, continues to do quite well. Able to progress for increased complexity of activity for  shoulder complex with pt tolerating activity quite well. Continues to have more difficulty w/ B shoulders in internally rotated positions although no overt pain. Significant time spent discussing HEP and pt activity in group exercise classes, education on relevant anatomy/physiology and exercise modification as needed. Pt tolerates session well without pain or adverse event. Pt departs today's session in no acute distress, all voiced questions/concerns addressed appropriately from PT perspective.  If continues to progress along current trajectory would likely be appropriate for discharge to independent HEP/gym within next few visits.     OBJECTIVE IMPAIRMENTS: decreased activity tolerance, decreased mobility, decreased ROM, decreased strength, impaired sensation, impaired UE functional use, and pain.    ACTIVITY LIMITATIONS: carrying, lifting, sleeping, reach over head, and hygiene/grooming   PARTICIPATION LIMITATIONS: meal prep, cleaning, laundry, driving, shopping, community activity, occupation, and yard work   PERSONAL FACTORS: Time since onset of injury/illness/exacerbation are also affecting patient's functional outcome.    REHAB POTENTIAL: Good   CLINICAL DECISION MAKING: Stable/uncomplicated   EVALUATION COMPLEXITY: Low     GOALS: Goals reviewed with patient? No   SHORT TERM GOALS: Target date: 02/15/2022     Pt will demonstrate appropriate understanding and performance of initially prescribed HEP in order to facilitate improved independence with management of symptoms.  Baseline: HEP provided on eval Goal status: MET 02/01/22   2. Pt will score  greater than or equal to 73 on FOTO in order to demonstrate improved perception of function due to symptoms.            Baseline: 71  02/10/22: 75%            Goal status: MET   LONG TERM GOALS: Target date: 03/15/2022       Pt will score 75 on FOTO in order to demonstrate improved perception of function due to symptoms. Baseline: 71 02/10/22: 75 Goal status: MET   2.  Pt will demonstrate independence with final HEP to facilitate improved long term management of symptoms. Baseline: initial HEP prescribed Goal status: INITIAL   3.  Pt will demonstrate at least 4/5 and grossly symmetrical shoulder MMT B for improved symmetry of UE strength and improved tolerance to functional movements.  Baseline: see MMT chart above Goal status: INITIAL   4. Pt will report/demonstrate ability to perform typical household activities with less than 2 point increase in pain on NPS in order to indicative improved tolerance/independence for functional tasks.             Baseline: up to 5/10 on NPS with household activities            Goal status: INITIAL    5. Pt will demonstrate grossly WFL shoulder elevation B with less than 2/10 pain on NPS for improved tolerance to overhead tasks.            Baseline: pain with lowering from elevation B, see ROM chart above            Goal status: initial   PLAN:   PT FREQUENCY: 2x/week   PT DURATION: 6 weeks   PLANNED INTERVENTIONS: Therapeutic exercises, Therapeutic activity, Neuromuscular re-education, Balance training, Gait training, Patient/Family education, Self Care, Joint mobilization, Dry Needling, Electrical stimulation, Spinal mobilization, Cryotherapy, Moist heat, Taping, Manual therapy, and Re-evaluation   PLAN FOR NEXT SESSION:  progress ROM/strengthening as able/tolerated. Emphasis on ER and serratus. Consider HEP review/update  Leeroy Cha PT, DPT 02/17/2022 11:09 AM

## 2022-02-17 ENCOUNTER — Encounter: Payer: Self-pay | Admitting: Physical Therapy

## 2022-02-17 ENCOUNTER — Ambulatory Visit: Payer: Commercial Managed Care - HMO | Admitting: Physical Therapy

## 2022-02-17 DIAGNOSIS — M25511 Pain in right shoulder: Secondary | ICD-10-CM

## 2022-02-17 DIAGNOSIS — M25512 Pain in left shoulder: Secondary | ICD-10-CM

## 2022-02-17 DIAGNOSIS — M6281 Muscle weakness (generalized): Secondary | ICD-10-CM

## 2022-02-17 DIAGNOSIS — M25611 Stiffness of right shoulder, not elsewhere classified: Secondary | ICD-10-CM

## 2022-02-22 ENCOUNTER — Ambulatory Visit: Payer: Commercial Managed Care - HMO

## 2022-02-22 DIAGNOSIS — M25511 Pain in right shoulder: Secondary | ICD-10-CM

## 2022-02-22 DIAGNOSIS — M6281 Muscle weakness (generalized): Secondary | ICD-10-CM

## 2022-02-22 DIAGNOSIS — M25512 Pain in left shoulder: Secondary | ICD-10-CM

## 2022-02-22 NOTE — Therapy (Signed)
OUTPATIENT PHYSICAL THERAPY TREATMENT NOTE   Patient Name: Jean Pittman MRN: [id number] DOB:08/02/1960, 61 y.o., female Today's Date: 02/22/2022  PCP: Binnie Rail, MD   REFERRING PROVIDER: Garald Balding, MD  END OF SESSION:   PT End of Session - 02/22/22 1126     Visit Number 9    Number of Visits 13    Date for PT Re-Evaluation 03/15/22    Authorization Type [employer]    Authorization - Visit Number 4    Authorization - Number of Visits 30   annual limit   PT Start Time 1100    PT Stop Time 1140    PT Time Calculation (min) 40 min    Activity Tolerance Patient tolerated treatment well;No increased pain    Behavior During Therapy [facility] for tasks assessed/performed                   Past Medical History:  Diagnosis Date   Arthritis    Dizziness    Hyperlipidemia    denies   Tubular adenoma of colon 03/30/09   Dr Sharlett Iles   Past Surgical History:  Procedure Laterality Date   APPENDECTOMY     with 1st pregnancy   BRAVO [place] STUDY N/A 11/30/2017   Procedure: BRAVO [place];  Surgeon: Milus Banister, MD;  Location: [facility];  Service: Endoscopy;  Laterality: N/A;   BREAST BIOPSY     needle aspiration X1; resection X 1   colonoscopy with polypectomy  03/30/2009   Dr Sharlett Iles   CT SCAN  2017   Chest due to a possible blood clot but came back normal   ESOPHAGOGASTRODUODENOSCOPY (EGD) WITH PROPOFOL N/A 11/30/2017   Procedure: ESOPHAGOGASTRODUODENOSCOPY (EGD) WITH PROPOFOL;  Surgeon: Milus Banister, MD;  Location: [facility];  Service: Endoscopy;  Laterality: N/A;  48 hour Ph probe   frozen shoulder Left    broke the cartilage-was put to sleep Dr. Joni Fears apox 2014   G 3 P 3     3 C sections   TUBAL LIGATION     Patient Active Problem List   Diagnosis Date Noted   Impingement syndrome of left shoulder 02/02/2022   Acute pain of right shoulder 11/23/2021   Decreased hearing of right ear 01/19/2021   Acute non-recurrent sinusitis  01/19/2021   Hyperglycemia 09/20/2018   Upper airway cough syndrome 01/10/2017   Chronic cough 03/22/2016   EKG abnormalities 02/15/2012   ADENOMATOUS COLONIC POLYP 04/16/2010   REACTIVE AIRWAY DISEASE 04/16/2010   Hyperlipidemia 03/12/2009    REFERRING DIAG: M25.511 (ICD-10-CM) - Acute pain of right shoulder   THERAPY DIAG:  Right shoulder pain, unspecified chronicity  Left shoulder pain, unspecified chronicity  Muscle weakness (generalized)  Rationale for Evaluation and Treatment Rehabilitation  PERTINENT HISTORY: osteopenia; self employed, typically quite active  PRECAUTIONS: osteopenia  SUBJECTIVE:  SUBJECTIVE STATEMENT:   Patient reports no pain at start of session. States that she is overall feeling better. States that she did have some difficulty taking off a necklace.     PAIN:   Are you having pain: 0/10 Location: R scapula, lateral shoulder; L side anterior shoulder into bicep How would you describe your pain? discomfort Best in past week: 0/10 Worst in past week: 5/10 Aggravating factors: exercise, lifting overhead, heavy lifting/carrying Easing factors: medication, rest   OBJECTIVE: (objective measures completed at initial evaluation unless otherwise dated)   DIAGNOSTIC FINDINGS:  X rays unremarkable per referring MD notes and per pt    PATIENT SURVEYS:  FOTO: 71% eval, 75% predicted FOTO 02/10/22 75%    COGNITION: Overall cognitive status: Within functional limits for tasks assessed                                  SENSATION: Light touch intact B UE and LE, aside from subjective increase in sensitivity R C5 compared to L   POSTURE: Grossly WNL   UPPER EXTREMITY ROM:   Active ROM Right eval Left eval  Shoulder flexion 165 162  Shoulder abduction 160 155   Shoulder internal rotation      Shoulder external rotation      Elbow flexion      Elbow extension      Wrist flexion      Wrist extension       (Blank rows = not tested) Comments: more pain with lowering B from flexion   UPPER EXTREMITY MMT:   MMT Right eval Left eval R/L 02/10/22  Shoulder flexion 4 4p! 4+/4+ (mild pain B)  Shoulder extension       Shoulder abduction 4p! 4p! 4+/4+ (no pain)  Shoulder extension       Shoulder internal rotation 4+ 5 5/5  Shoulder external rotation 4- 3+ 4/4-  Elbow flexion 5 (R shoulder discomfort) 5   Elbow extension 5 5 (L triceps discomfort)   Grip strength        (Blank rows = not tested)    PALPATION:  Concordant pain with trigger points in R supraspinatus, infraspinatus, levator scap, and proximal biceps. Supraspinatus trigger point refers down arm L side: most tenderness in infraspinatus and deltoid             TODAY'S TREATMENT:                                                                                                  [facility] Adult PT Treatment:                                                DATE:  02/22/2022 Therapeutic Exercise 2x10 standing shoulder sweeps with 2lbs 2x10 spider wall walks RTB Towel sliders shoulder flexion x10 reps Seated 90/90 2x10. 2lbs on right, 4lbs on left Self STM x2 minutes Standing  overhead carry with yellow med ball 6 laps x30 feet  Wall ball circles CW, CCW 2x30 seconds UBE 97mn fwd/2 min bkwd Cable column rows 3x10 with 10lbs  02/17/22 Therapeutic Exercise: UBE fwd/back 238m each during subjective Bent row + tricep kickback 5# 2x10 cues for setup and sequencing 5# waiter's carry KB, 2x5072fues for form and setup Reactive swiss ball perturbations 2x30sec each UE Bird dog 2x12 cues for trunk alignment and posture  DB 3 way raises (lateral, scap, flex) x5 w 2#, x5 3# cues for form, shoulder alignment   Self Care: Significant time spent discussing activity modification, exercises in group  settings, monitoring of symptoms and methods to accommodate exercises and home activities    [facility]                                                DATE: 02/15/22 Therapeutic Exercise: UBE 2mi13mwd 2min20mck during subjective 10# cable column row 2x10 cues for form and posture 3# wide row cable column 2x8 cues for form and posture Blue theraband ER walkout B x5 each, cues for posture and elbow positioning Green TB 3x8 cues for form and scapular positioning  2# waiters carry x30ft 47f, 5# 2x30ft c62ffor scapular positioning and pacing  5# serratus punch supine x12, 8# x12, 10# x12 cues for form and reduced compensation at elbow HEP review   [facility] Adult PT Treatment:                                                DATE: 02/10/22 Therapeutic Exercise: Cable column row 7# each UE x8 x12, cues for form and appropriate setup; 10# each UE x10 Cable column shoulder ext 3# each UE x12, 7# each x8 GTB ER walkout 2x5 each UE, cues for form and posture Standing swiss ball flexion at table x15, cues for form and pacing  Scaption AAROM w/ cane x8 each  Significant time w/ education re: exercise physiology, rationale for intervention, and relevant anatomy/physiology with activity, HEP review    PATIENT EDUCATION: Education details: rationale for interventions, relevant anatomy/physiology Person educated: Patient Education method: Explanation, Demonstration, Tactile cues, Verbal cues Education comprehension: verbalized understanding, returned demonstration, verbal cues required, tactile cues required, and needs further education     HOME EXERCISE PROGRAM: Access Code: [email or web address] URL: [email or web address] Date: 02/08/2022 Prepared by: KristofStarr Lakeises - Standing Isometric Shoulder External Rotation with Doorway  - 1 x daily - 7 x weekly - 3 sets - 5 reps - Seated Scapular Retraction  - 1 x daily - 7 x weekly - 3 sets - 10 reps - Seated Upper Trapezius  Stretch (Mirrored)  - 2 x daily - 7 x weekly - 2 sets - 2 reps - 30 seconds hold - Standing Shoulder Scaption  - 1 x daily - 7 x weekly - 2 sets - 10 reps - Wall Push Up with Plus  - 1 x daily - 7 x weekly - 2 sets - 10 reps - 5 hold - Supine Bilateral Shoulder Protraction  - 1 x daily - 7 x weekly - 2 sets - 10 reps - Shoulder I's  - 1 x daily - 7 x weekly -  2 sets - 12 reps - Shoulder T's  - 1 x daily - 7 x weekly - 2 sets - 12 reps - Shoulder Y's  - 1 x daily - 7 x weekly - 2 sets - 12 reps   ASSESSMENT:   CLINICAL IMPRESSION: Patient presents to therapy with minimal pain at rest. Does continue to demonstrate decreased activity tolerance and muscular endurance with fatigue and need for rest breaks following prolonged activity. Progressed with standing shoulder sweeps and spider wall walks to improve endurance and ease with reaching. Also progressed with 90/90 ER in sitting. Increased difficulty and slight pain in right infraspinatus with 90/90 ER and shows compensation of rounded shoulder and decreased ROM. Patient continues to require skilled therapy services to address deficits and return to prior level of care.    OBJECTIVE IMPAIRMENTS: decreased activity tolerance, decreased mobility, decreased ROM, decreased strength, impaired sensation, impaired UE functional use, and pain.    ACTIVITY LIMITATIONS: carrying, lifting, sleeping, reach over head, and hygiene/grooming   PARTICIPATION LIMITATIONS: meal prep, cleaning, laundry, driving, shopping, community activity, occupation, and yard work   PERSONAL FACTORS: Time since onset of injury/illness/exacerbation are also affecting patient's functional outcome.    REHAB POTENTIAL: Good   CLINICAL DECISION MAKING: Stable/uncomplicated   EVALUATION COMPLEXITY: Low     GOALS: Goals reviewed with patient? No   SHORT TERM GOALS: Target date: 02/15/2022     Pt will demonstrate appropriate understanding and performance of initially  prescribed HEP in order to facilitate improved independence with management of symptoms.  Baseline: HEP provided on eval Goal status: MET 02/01/22   2. Pt will score greater than or equal to 73 on FOTO in order to demonstrate improved perception of function due to symptoms.            Baseline: 71  02/10/22: 75%            Goal status: MET   LONG TERM GOALS: Target date: 03/15/2022       Pt will score 75 on FOTO in order to demonstrate improved perception of function due to symptoms. Baseline: 71 02/10/22: 75 Goal status: MET   2.  Pt will demonstrate independence with final HEP to facilitate improved long term management of symptoms. Baseline: initial HEP prescribed Goal status: INITIAL   3.  Pt will demonstrate at least 4/5 and grossly symmetrical shoulder MMT B for improved symmetry of UE strength and improved tolerance to functional movements.  Baseline: see MMT chart above Goal status: INITIAL   4. Pt will report/demonstrate ability to perform typical household activities with less than 2 point increase in pain on NPS in order to indicative improved tolerance/independence for functional tasks.             Baseline: up to 5/10 on NPS with household activities            Goal status: INITIAL    5. Pt will demonstrate grossly WFL shoulder elevation B with less than 2/10 pain on NPS for improved tolerance to overhead tasks.            Baseline: pain with lowering from elevation B, see ROM chart above            Goal status: initial   PLAN:   PT FREQUENCY: 2x/week   PT DURATION: 6 weeks   PLANNED INTERVENTIONS: Therapeutic exercises, Therapeutic activity, Neuromuscular re-education, Balance training, Gait training, Patient/Family education, Self Care, Joint mobilization, Dry Needling, Electrical stimulation, Spinal mobilization, Cryotherapy, Moist heat,  Taping, Manual therapy, and Re-evaluation   PLAN FOR NEXT SESSION:  progress ROM/strengthening as able/tolerated. Emphasis on  ER and serratus. Consider HEP review/update  Lona Millard Erickson Yamashiro PT, DPT 02/22/2022 11:38 AM

## 2022-02-24 ENCOUNTER — Ambulatory Visit: Payer: Commercial Managed Care - HMO

## 2022-02-24 DIAGNOSIS — M6281 Muscle weakness (generalized): Secondary | ICD-10-CM

## 2022-02-24 DIAGNOSIS — M25511 Pain in right shoulder: Secondary | ICD-10-CM

## 2022-02-24 DIAGNOSIS — M25611 Stiffness of right shoulder, not elsewhere classified: Secondary | ICD-10-CM

## 2022-02-24 DIAGNOSIS — M25512 Pain in left shoulder: Secondary | ICD-10-CM

## 2022-02-24 NOTE — Therapy (Signed)
OUTPATIENT PHYSICAL THERAPY TREATMENT NOTE/ DISCHARGE SUMMARY   Patient Name: Jean Pittman MRN: [id number] DOB:12-Mar-1960, 61 y.o., female Today's Date: 02/24/2022  PCP: Binnie Rail, MD   REFERRING PROVIDER: Garald Balding, MD  END OF SESSION:   PT End of Session - 02/24/22 1000     Visit Number 10    Number of Visits 13    Date for PT Re-Evaluation 03/15/22    Authorization Type [employer]    Authorization - Visit Number 5    Authorization - Number of Visits 30    PT Start Time 1000    PT Stop Time 0871    PT Time Calculation (min) 38 min    Activity Tolerance Patient tolerated treatment well;No increased pain    Behavior During Therapy [facility] for tasks assessed/performed                    Past Medical History:  Diagnosis Date   Arthritis    Dizziness    Hyperlipidemia    denies   Tubular adenoma of colon 03/30/09   Dr Sharlett Iles   Past Surgical History:  Procedure Laterality Date   APPENDECTOMY     with 1st pregnancy   BRAVO [place] STUDY N/A 11/30/2017   Procedure: BRAVO [place];  Surgeon: Milus Banister, MD;  Location: [facility];  Service: Endoscopy;  Laterality: N/A;   BREAST BIOPSY     needle aspiration X1; resection X 1   colonoscopy with polypectomy  03/30/2009   Dr Sharlett Iles   CT SCAN  2017   Chest due to a possible blood clot but came back normal   ESOPHAGOGASTRODUODENOSCOPY (EGD) WITH PROPOFOL N/A 11/30/2017   Procedure: ESOPHAGOGASTRODUODENOSCOPY (EGD) WITH PROPOFOL;  Surgeon: Milus Banister, MD;  Location: [facility];  Service: Endoscopy;  Laterality: N/A;  48 hour Ph probe   frozen shoulder Left    broke the cartilage-was put to sleep Dr. Joni Fears apox 2014   G 3 P 3     3 C sections   TUBAL LIGATION     Patient Active Problem List   Diagnosis Date Noted   Impingement syndrome of left shoulder 02/02/2022   Acute pain of right shoulder 11/23/2021   Decreased hearing of right ear 01/19/2021   Acute non-recurrent sinusitis  01/19/2021   Hyperglycemia 09/20/2018   Upper airway cough syndrome 01/10/2017   Chronic cough 03/22/2016   EKG abnormalities 02/15/2012   ADENOMATOUS COLONIC POLYP 04/16/2010   REACTIVE AIRWAY DISEASE 04/16/2010   Hyperlipidemia 03/12/2009    REFERRING DIAG: M25.511 (ICD-10-CM) - Acute pain of right shoulder   THERAPY DIAG:  Right shoulder pain, unspecified chronicity  Left shoulder pain, unspecified chronicity  Muscle weakness (generalized)  Stiffness of right shoulder, not elsewhere classified  Rationale for Evaluation and Treatment Rehabilitation  PERTINENT HISTORY: osteopenia; self employed, typically quite active  PRECAUTIONS: osteopenia  SUBJECTIVE:  SUBJECTIVE STATEMENT:   Pt reports feeling well, reporting only reporting minor Rt posterior shoulder discomfort. She reports continued adherence to her HEP.    PAIN:   Are you having pain: 0/10 Location: R scapula, lateral shoulder; L side anterior shoulder into bicep How would you describe your pain? discomfort Best in past week: 0/10 Worst in past week: 5/10 Aggravating factors: exercise, lifting overhead, heavy lifting/carrying Easing factors: medication, rest   OBJECTIVE: (objective measures completed at initial evaluation unless otherwise dated)   DIAGNOSTIC FINDINGS:  X rays unremarkable per referring MD notes and per pt    PATIENT SURVEYS:  FOTO: 71% eval, 75% predicted FOTO 02/10/22 75%    COGNITION: Overall cognitive status: Within functional limits for tasks assessed                                  SENSATION: Light touch intact B UE and LE, aside from subjective increase in sensitivity R C5 compared to L   POSTURE: Grossly WNL   UPPER EXTREMITY ROM:   Active ROM Right eval Left eval Right 02/24/2022  Left 02/24/2022  Shoulder flexion 165 162 172 176  Shoulder abduction 160 155 180 180  Shoulder internal rotation        Shoulder external rotation        Elbow flexion        Elbow extension        Wrist flexion        Wrist extension         (Blank rows = not tested) Comments: more pain with lowering B from flexion   UPPER EXTREMITY MMT:   MMT Right eval Left eval R/L 02/10/22 Right 02/24/2022 Left 02/24/2022  Shoulder flexion 4 4p! 4+/4+ (mild pain B) 5/5 5/5  Shoulder extension         Shoulder abduction 4p! 4p! 4+/4+ (no pain) 5/5 5/5  Shoulder extension         Shoulder internal rotation 4+ 5 5/5 5/5 5/5  Shoulder external rotation 4- 3+ 4/4- 4+/5 4+/5  Elbow flexion 5 (R shoulder discomfort) 5  5/5 5/5  Elbow extension 5 5 (L triceps discomfort)  5/5 5/5  Grip strength          (Blank rows = not tested)    PALPATION:  Concordant pain with trigger points in R supraspinatus, infraspinatus, levator scap, and proximal biceps. Supraspinatus trigger point refers down arm L side: most tenderness in infraspinatus and deltoid             TODAY'S TREATMENT:                                                           [facility] Adult PT Treatment:                                                DATE: 02/24/2022 Therapeutic Exercise: Standing shoulder IR stretch with towel 2x42mn BIL Standing BIL shoulder ER with red band 3x10 with 3-sec hold Standing low row with blue band 3x10 with 3-sec hold Standing cross-body shoulder adduction stretch 2x125m BIL Manual  Therapy: N/A Neuromuscular re-ed: N/A Therapeutic Activity: Re-assessment of objective measures/ goals with pt education on progress Update to HEP/ POC following D/C Modalities: N/A Self Care: N/A                                          [facility] Adult PT Treatment:                                                DATE:  02/22/2022 Therapeutic Exercise 2x10 standing shoulder sweeps with 2lbs 2x10 spider wall walks  RTB Towel sliders shoulder flexion x10 reps Seated 90/90 2x10. 2lbs on right, 4lbs on left Self STM x2 minutes Standing overhead carry with yellow med ball 6 laps x30 feet  Wall ball circles CW, CCW 2x30 seconds UBE 44mn fwd/2 min bkwd Cable column rows 3x10 with 10lbs  02/17/22 Therapeutic Exercise: UBE fwd/back 293m each during subjective Bent row + tricep kickback 5# 2x10 cues for setup and sequencing 5# waiter's carry KB, 2x5071fues for form and setup Reactive swiss ball perturbations 2x30sec each UE Bird dog 2x12 cues for trunk alignment and posture  DB 3 way raises (lateral, scap, flex) x5 w 2#, x5 3# cues for form, shoulder alignment   Self Care: Significant time spent discussing activity modification, exercises in group settings, monitoring of symptoms and methods to accommodate exercises and home activities       PATIENT EDUCATION: Education details: rationale for interventions, relevant anatomy/physiology Person educated: Patient Education method: Explanation, Demonstration, Tactile cues, Verbal cues Education comprehension: verbalized understanding, returned demonstration, verbal cues required, tactile cues required, and needs further education     HOME EXERCISE PROGRAM: Access Code: [email or web address] URL: [email or web address] Date: 02/24/2022 Prepared by: TucVanessa Durhamxercises - Seated Scapular Retraction  - 1 x daily - 7 x weekly - 3 sets - 10 reps - Seated Upper Trapezius Stretch (Mirrored)  - 2 x daily - 7 x weekly - 2 sets - 2 reps - 30 seconds hold - Standing Shoulder Scaption  - 1 x daily - 7 x weekly - 2 sets - 10 reps - Wall Push Up with Plus  - 1 x daily - 7 x weekly - 2 sets - 10 reps - 5 hold - Supine Bilateral Shoulder Protraction  - 1 x daily - 7 x weekly - 2 sets - 10 reps - Shoulder I's  - 1 x daily - 7 x weekly - 2 sets - 12 reps - Shoulder T's  - 1 x daily - 7 x weekly - 2 sets - 12 reps - Shoulder Y's  - 1 x daily - 7 x weekly - 2  sets - 12 reps - Shoulder External Rotation and Scapular Retraction with Resistance  - 1 x daily - 7 x weekly - 3 sets - 10 reps - 3 seconds hold - Standing Shoulder Internal Rotation Stretch with Towel  - 1 x daily - 7 x weekly - 2 sets - 1 minute hold - Standing Shoulder Row with Anchored Resistance  - 1 x daily - 7 x weekly - 3 sets - 10 reps   ASSESSMENT:   CLINICAL IMPRESSION: Upon re-assessment, the pt has met all of her rehab goals, demonstrating excellent improvements in pain levels, global  shoulder strength, and shoulder elevation AROM. Due to meeting her goals, the pt is discharged from PT at this time with an updated HEP.    OBJECTIVE IMPAIRMENTS: decreased activity tolerance, decreased mobility, decreased ROM, decreased strength, impaired sensation, impaired UE functional use, and pain.    ACTIVITY LIMITATIONS: carrying, lifting, sleeping, reach over head, and hygiene/grooming   PARTICIPATION LIMITATIONS: meal prep, cleaning, laundry, driving, shopping, community activity, occupation, and yard work   PERSONAL FACTORS: Time since onset of injury/illness/exacerbation are also affecting patient's functional outcome.         GOALS: Goals reviewed with patient? No   SHORT TERM GOALS: Target date: 02/15/2022     Pt will demonstrate appropriate understanding and performance of initially prescribed HEP in order to facilitate improved independence with management of symptoms.  Baseline: HEP provided on eval Goal status: MET 02/01/22   2. Pt will score greater than or equal to 73 on FOTO in order to demonstrate improved perception of function due to symptoms.            Baseline: 71  02/10/22: 75%            Goal status: MET   LONG TERM GOALS: Target date: 03/15/2022       Pt will score 75 on FOTO in order to demonstrate improved perception of function due to symptoms. Baseline: 71 02/10/22: 75 Goal status: MET   2.  Pt will demonstrate independence with final HEP to  facilitate improved long term management of symptoms. Baseline: initial HEP prescribed Pt reports adherence to her updated HEP Goal status: MET   3.  Pt will demonstrate at least 4/5 and grossly symmetrical shoulder MMT B for improved symmetry of UE strength and improved tolerance to functional movements.  Baseline: see MMT chart above 02/24/2022: See updated MMT chart Goal status: MET   4. Pt will report/demonstrate ability to perform typical household activities with less than 2 point increase in pain on NPS in order to indicative improved tolerance/independence for functional tasks.             Baseline: up to 5/10 on NPS with household activities  02/24/2022: 0/10 with household activities            Goal status: MET   5. Pt will demonstrate grossly WFL shoulder elevation B with less than 2/10 pain on NPS for improved tolerance to overhead tasks.            Baseline: pain with lowering from elevation B, see ROM chart above  02/24/2022: See updated ROM chart, 0/10 pain            Goal status: MET   PLAN:   PT FREQUENCY: 2x/week   PT DURATION: 6 weeks   PLANNED INTERVENTIONS: Therapeutic exercises, Therapeutic activity, Neuromuscular re-education, Balance training, Gait training, Patient/Family education, Self Care, Joint mobilization, Dry Needling, Electrical stimulation, Spinal mobilization, Cryotherapy, Moist heat, Taping, Manual therapy, and Re-evaluation   PLAN FOR NEXT SESSION:  Pt is discharged from PT at this time.  PHYSICAL THERAPY DISCHARGE SUMMARY  Visits from Start of Care: 10  Current functional level related to goals / functional outcomes: Pt has met all of her functional rehab goals.    Remaining deficits: N/A   Education / Equipment: Updated HEP   Patient agrees to discharge. Patient goals were met. Patient is being discharged due to meeting the stated rehab goals.   Vanessa [place], PT, DPT 02/24/22 10:39 AM

## 2022-05-30 ENCOUNTER — Encounter: Payer: Self-pay | Admitting: Obstetrics & Gynecology

## 2022-05-30 ENCOUNTER — Other Ambulatory Visit (HOSPITAL_COMMUNITY)
Admission: RE | Admit: 2022-05-30 | Discharge: 2022-05-30 | Disposition: A | Discharge status: Home / Self Care | Payer: 59 | Source: Ambulatory Visit | Attending: Obstetrics & Gynecology | Admitting: Obstetrics & Gynecology

## 2022-05-30 ENCOUNTER — Ambulatory Visit (INDEPENDENT_AMBULATORY_CARE_PROVIDER_SITE_OTHER): Payer: 59 | Admitting: Obstetrics & Gynecology

## 2022-05-30 VITALS — BP 126/82 | HR 82 | Ht 65.5 in | Wt 142.0 lb

## 2022-05-30 DIAGNOSIS — Z01419 Encounter for gynecological examination (general) (routine) without abnormal findings: Secondary | ICD-10-CM

## 2022-05-30 DIAGNOSIS — M85852 Other specified disorders of bone density and structure, left thigh: Secondary | ICD-10-CM | POA: Diagnosis not present

## 2022-05-30 DIAGNOSIS — M85851 Other specified disorders of bone density and structure, right thigh: Secondary | ICD-10-CM | POA: Diagnosis not present

## 2022-05-30 DIAGNOSIS — Z780 Asymptomatic menopausal state: Secondary | ICD-10-CM | POA: Diagnosis not present

## 2022-05-30 NOTE — Progress Notes (Signed)
Jean Pittman 17-May-1960 [id number]   History:    62 y.o.   G3P3L3 Married   RP:  Established patient presenting for annual gyn exam    HPI: Postmenopause, well on no HRT.  No PMB.  No pelvic pain. Abstinent, husband with ED. Pap Neg 12/2019. Pap reflex today. Breasts normal.  Mammo Neg 08/2021. BMI 23.27.  Walking regularly.  Health labs with Fam MD. Bone Density normal in 12/2016.  BD Osteopenia Rt Fem Neck -1.4 in 12/2021.  Mother with Colon Ca at 58 yo. Colonoscopy 11/2021.   Past medical history,surgical history, family history and social history were all reviewed and documented in the EPIC chart.  Gynecologic History Patient's last menstrual period was 07/01/2013.  Obstetric History OB History  Gravida Para Term Preterm AB Living  3 3     0 3  SAB IAB Ectopic Multiple Live Births      0        # Outcome Date GA Lbr Len/2nd Weight Sex Delivery Anes PTL Lv  3 Para           2 Para           1 Para              ROS: A ROS was performed and pertinent positives and negatives are included in the history. GENERAL: No fevers or chills. HEENT: No change in vision, no earache, sore throat or sinus congestion. NECK: No pain or stiffness. CARDIOVASCULAR: No chest pain or pressure. No palpitations. PULMONARY: No shortness of breath, cough or wheeze. GASTROINTESTINAL: No abdominal pain, nausea, vomiting or diarrhea, melena or bright red blood per rectum. GENITOURINARY: No urinary frequency, urgency, hesitancy or dysuria. MUSCULOSKELETAL: No joint or muscle pain, no back pain, no recent trauma. DERMATOLOGIC: No rash, no itching, no lesions. ENDOCRINE: No polyuria, polydipsia, no heat or cold intolerance. No recent change in weight. HEMATOLOGICAL: No anemia or easy bruising or bleeding. NEUROLOGIC: No headache, seizures, numbness, tingling or weakness. PSYCHIATRIC: No depression, no loss of interest in normal activity or change in sleep pattern.     Exam:   BP 126/82 (BP Location: Right  Arm, Patient Position: Sitting, Cuff Size: Normal)   Pulse 82   Ht 5' 5.5" (1.664 m)   Wt 142 lb (64.4 kg)   LMP 07/01/2013   SpO2 97%   BMI 23.27 kg/m   Body mass index is 23.27 kg/m.  General appearance : Well developed well nourished female. No acute distress HEENT: Eyes: no retinal hemorrhage or exudates,  Neck supple, trachea midline, no carotid bruits, no thyroidmegaly Lungs: Clear to auscultation, no rhonchi or wheezes, or rib retractions  Heart: Regular rate and rhythm, no murmurs or gallops Breast:Examined in sitting and supine position were symmetrical in appearance, no palpable masses or tenderness,  no skin retraction, no nipple inversion, no nipple discharge, no skin discoloration, no axillary or supraclavicular lymphadenopathy Abdomen: no palpable masses or tenderness, no rebound or guarding Extremities: no edema or skin discoloration or tenderness  Pelvic: Vulva: Normal             Vagina: No gross lesions or discharge  Cervix: No gross lesions or discharge.  Pap reflex done.  Uterus  AV, normal size, shape and consistency, non-tender and mobile  Adnexa  Without masses or tenderness  Anus: Normal   Assessment/Plan:  62 y.o. female for annual exam   1. Encounter for routine gynecological examination with Papanicolaou smear of cervix Postmenopause, well on no  HRT.  No PMB.  No pelvic pain. Abstinent, husband with ED. Pap Neg 12/2019. Pap reflex today. Breasts normal.  Mammo Neg 08/2021. BMI 23.27.  Walking regularly. Health labs with Fam MD. Bone Density normal in 12/2016.  BD Osteopenia Rt Fem Neck -1.4 in 12/2021.  Mother with Colon Ca at 50 yo. Colonoscopy 11/2021. - Cytology - PAP( [place])  2. Postmenopausal Postmenopause, well on no HRT.  No PMB.  No pelvic pain. Abstinent.  3. Osteopenia of necks of both femurs Bone Density normal in 12/2016.  BD Osteopenia Rt Fem Neck -1.4 in 12/2021.    Other orders - Melatonin 10 MG TABS; Take by mouth. -  Cyanocobalamin (VITAMIN B-12) 500 MCG SUBL; Place under the tongue.   Princess Bruins MD, 1:41 PM

## 2022-05-31 DIAGNOSIS — I788 Other diseases of capillaries: Secondary | ICD-10-CM | POA: Diagnosis not present

## 2022-05-31 DIAGNOSIS — L570 Actinic keratosis: Secondary | ICD-10-CM | POA: Diagnosis not present

## 2022-05-31 DIAGNOSIS — Z85828 Personal history of other malignant neoplasm of skin: Secondary | ICD-10-CM | POA: Diagnosis not present

## 2022-06-01 LAB — CYTOLOGY - PAP: Diagnosis: NEGATIVE

## 2022-06-14 DIAGNOSIS — M9901 Segmental and somatic dysfunction of cervical region: Secondary | ICD-10-CM | POA: Diagnosis not present

## 2022-06-14 DIAGNOSIS — M5413 Radiculopathy, cervicothoracic region: Secondary | ICD-10-CM | POA: Diagnosis not present

## 2022-06-14 DIAGNOSIS — M5412 Radiculopathy, cervical region: Secondary | ICD-10-CM | POA: Diagnosis not present

## 2022-06-14 DIAGNOSIS — M9902 Segmental and somatic dysfunction of thoracic region: Secondary | ICD-10-CM | POA: Diagnosis not present

## 2022-06-20 DIAGNOSIS — M9902 Segmental and somatic dysfunction of thoracic region: Secondary | ICD-10-CM | POA: Diagnosis not present

## 2022-06-20 DIAGNOSIS — M9901 Segmental and somatic dysfunction of cervical region: Secondary | ICD-10-CM | POA: Diagnosis not present

## 2022-06-20 DIAGNOSIS — M5413 Radiculopathy, cervicothoracic region: Secondary | ICD-10-CM | POA: Diagnosis not present

## 2022-06-20 DIAGNOSIS — M5412 Radiculopathy, cervical region: Secondary | ICD-10-CM | POA: Diagnosis not present

## 2022-07-27 DIAGNOSIS — M5412 Radiculopathy, cervical region: Secondary | ICD-10-CM | POA: Diagnosis not present

## 2022-07-27 DIAGNOSIS — M9904 Segmental and somatic dysfunction of sacral region: Secondary | ICD-10-CM | POA: Diagnosis not present

## 2022-07-27 DIAGNOSIS — M5413 Radiculopathy, cervicothoracic region: Secondary | ICD-10-CM | POA: Diagnosis not present

## 2022-07-27 DIAGNOSIS — M461 Sacroiliitis, not elsewhere classified: Secondary | ICD-10-CM | POA: Diagnosis not present

## 2022-07-27 DIAGNOSIS — M9902 Segmental and somatic dysfunction of thoracic region: Secondary | ICD-10-CM | POA: Diagnosis not present

## 2022-07-27 DIAGNOSIS — M9901 Segmental and somatic dysfunction of cervical region: Secondary | ICD-10-CM | POA: Diagnosis not present

## 2022-07-28 ENCOUNTER — Telehealth: Payer: Self-pay | Admitting: Physician Assistant

## 2022-07-28 NOTE — Telephone Encounter (Signed)
Patient asking to speak to [facility] about her shoulder issues. Per Dr. Cleophas Dunker.

## 2022-07-29 ENCOUNTER — Other Ambulatory Visit: Payer: Self-pay | Admitting: Physician Assistant

## 2022-07-29 DIAGNOSIS — G8929 Other chronic pain: Secondary | ICD-10-CM

## 2022-08-02 ENCOUNTER — Encounter: Payer: Self-pay | Admitting: Orthopedic Surgery

## 2022-08-02 DIAGNOSIS — M9901 Segmental and somatic dysfunction of cervical region: Secondary | ICD-10-CM | POA: Diagnosis not present

## 2022-08-02 DIAGNOSIS — M25531 Pain in right wrist: Secondary | ICD-10-CM | POA: Diagnosis not present

## 2022-08-02 DIAGNOSIS — M9902 Segmental and somatic dysfunction of thoracic region: Secondary | ICD-10-CM | POA: Diagnosis not present

## 2022-08-02 DIAGNOSIS — M5413 Radiculopathy, cervicothoracic region: Secondary | ICD-10-CM | POA: Diagnosis not present

## 2022-08-02 DIAGNOSIS — M25532 Pain in left wrist: Secondary | ICD-10-CM | POA: Diagnosis not present

## 2022-08-02 DIAGNOSIS — M5412 Radiculopathy, cervical region: Secondary | ICD-10-CM | POA: Diagnosis not present

## 2022-08-15 ENCOUNTER — Ambulatory Visit: Payer: 59 | Admitting: Orthopedic Surgery

## 2022-08-15 ENCOUNTER — Encounter: Payer: Self-pay | Admitting: Orthopedic Surgery

## 2022-08-15 DIAGNOSIS — M25511 Pain in right shoulder: Secondary | ICD-10-CM

## 2022-08-15 DIAGNOSIS — R200 Anesthesia of skin: Secondary | ICD-10-CM

## 2022-08-15 NOTE — Progress Notes (Signed)
Office Visit Note   Patient: Jean Pittman           Date of Birth: 01-10-1961           MRN: [id number] Visit Date: 08/15/2022 Requested by: Pincus Sanes, MD [address] [place],  [place] [address] PCP: Pincus Sanes, MD  Subjective: Chief Complaint  Patient presents with   Other     Shoulder pain    HPI: Anice Wilshire is a 62 y.o. female who presents to the office reporting bilateral shoulder pain right worse than left.  Reported acute onset of pain while on vacation in September 2023.  Describes radicular component with some numbness and tingling into the right arm but no neck pain.  She is right-hand dominant.  She did have a right shoulder injection done in October 2023 and the left shoulder injection performed in December 2023.  The pain does wake her from sleep at night.  She tried physical therapy.  PT and injections have helped however.  She does report some scapular pain as well.  She also describes carpal tunnel syndrome which wakes her from sleep at night involving digits 1 2 and 3.  She does wear a splint and has done so for several months.  She reports increased pain in that right arm with activity.  She does get some relief with putting the arms up overhead at night.  Symptoms currently are not as bad as they were in September/October.  She has been going to a chiropractor as well as some relief.  She also does cervical traction at home 1-2 times a day with relief.  Overall she is much better but she would like to find the root cause of her issues.  Old radiographs are reviewed and show no real arthritis in the glenohumeral joint and no diminishment of acromiohumeral interval..  Patient has had plain radiographs but no MRI of her cervical spine.  Patient also has not had an MRI arthrogram of the right shoulder.              ROS: All systems reviewed are negative as they relate to the chief complaint within the history of present illness.  Patient denies fevers or  chills.  Assessment & Plan: Visit Diagnoses:  1. Acute pain of right shoulder   2. Hand numbness     Plan: Impression is right hand carpal tunnel syndrome which is classic.  Need to assess severity and suitability for operative versus continued nonoperative intervention on that right hand.  Plan nerve conduction study to evaluate carpal tunnel syndrome.  Also regarding the right shoulder she has maintained passive range of motion but there is definitely some mechanical symptoms by history and exam going on on the right-hand side.  Likely biceps mediated pain.  MRI arthrogram indicated for longer than 8 weeks of symptoms with failure of conservative management.  Follow-up after that study.  Follow-Up Instructions: No follow-ups on file.   Orders:  Orders Placed This Encounter  Procedures   MR SHOULDER RIGHT W CONTRAST   Arthrogram   Ambulatory referral to Physical Medicine Rehab   No orders of the defined types were placed in this encounter.     Procedures: No procedures performed   Clinical Data: No additional findings.  Objective: Vital Signs: LMP 07/01/2013   Physical Exam:  Constitutional: Patient appears well-developed HEENT:  Head: Normocephalic Eyes:EOM are normal Neck: Normal range of motion Cardiovascular: Normal rate Pulmonary/chest: Effort normal Neurologic: Patient  is alert Skin: Skin is warm Psychiatric: Patient has normal mood and affect  Ortho Exam: Ortho exam demonstrates bilateral shoulder range of motion of 80/100/175 passive.  She has good rotator cuff strength infraspinatus supraspinatus and subscap muscle testing bilaterally.  O'Brien's testing positive on the right.  Speeds testing positive on the right.  No discrete AC joint tenderness on either side.  Neck range of motion is full.  No paresthesias C5-T1.  She does have positive carpal tunnel compression testing on the right with no subluxation or Tinel's of the ulnar nerve at the elbow.  5 out of 5  grip EPL FPL interosseous wrist flexion extension bicep triceps and deltoid strength with no abductor pollicis brevis wasting on the right-hand side.  Specialty Comments:  No specialty comments available.  Imaging: No results found.   PMFS History: Patient Active Problem List   Diagnosis Date Noted   Impingement syndrome of left shoulder 02/02/2022   Acute pain of right shoulder 11/23/2021   Decreased hearing of right ear 01/19/2021   Acute non-recurrent sinusitis 01/19/2021   Hyperglycemia 09/20/2018   Upper airway cough syndrome 01/10/2017   Chronic cough 03/22/2016   EKG abnormalities 02/15/2012   ADENOMATOUS COLONIC POLYP 04/16/2010   REACTIVE AIRWAY DISEASE 04/16/2010   Hyperlipidemia 03/12/2009   Past Medical History:  Diagnosis Date   Arthritis    Dizziness    Hyperlipidemia    denies   Tubular adenoma of colon 03/30/09   Dr Jarold Motto    Family History  Problem Relation Age of Onset   Colon cancer Mother    Cancer - Colon Mother    Heart disease Father    Hyperlipidemia Father    Stroke Maternal Aunt        ? . one M aunt;> 65   Colon cancer Maternal Aunt    Colon cancer Maternal Aunt    Breast cancer Paternal Aunt    Diabetes Paternal Aunt    Esophageal cancer Neg Hx    Stomach cancer Neg Hx    Rectal cancer Neg Hx     Past Surgical History:  Procedure Laterality Date   APPENDECTOMY     with 1st pregnancy   BRAVO [place] STUDY N/A 11/30/2017   Procedure: BRAVO PH STUDY;  Surgeon: Rachael Fee, MD;  Location: [facility];  Service: Endoscopy;  Laterality: N/A;   BREAST BIOPSY     needle aspiration X1; resection X 1   colonoscopy with polypectomy  03/30/2009   Dr Jarold Motto   CT SCAN  2017   Chest due to a possible blood clot but came back normal   ESOPHAGOGASTRODUODENOSCOPY (EGD) WITH PROPOFOL N/A 11/30/2017   Procedure: ESOPHAGOGASTRODUODENOSCOPY (EGD) WITH PROPOFOL;  Surgeon: Rachael Fee, MD;  Location: [facility];  Service: Endoscopy;   Laterality: N/A;  48 hour Ph probe   frozen shoulder Left    broke the cartilage-was put to sleep Dr. Norlene Campbell apox 2014   G 3 P 3     3 C sections   TUBAL LIGATION     Social History   Occupational History   Not on file  Tobacco Use   Smoking status: Never   Smokeless tobacco: Never  Vaping Use   Vaping Use: Never used  Substance and Sexual Activity   Alcohol use: No   Drug use: No   Sexual activity: Not Currently    Partners: Male    Birth control/protection: Post-menopausal    Comment: 1st intercourse- 31, partners- 2, married-  19 yrs

## 2022-08-17 DIAGNOSIS — M5412 Radiculopathy, cervical region: Secondary | ICD-10-CM | POA: Diagnosis not present

## 2022-08-17 DIAGNOSIS — M25531 Pain in right wrist: Secondary | ICD-10-CM | POA: Diagnosis not present

## 2022-08-17 DIAGNOSIS — M9901 Segmental and somatic dysfunction of cervical region: Secondary | ICD-10-CM | POA: Diagnosis not present

## 2022-08-17 DIAGNOSIS — M25512 Pain in left shoulder: Secondary | ICD-10-CM | POA: Diagnosis not present

## 2022-08-17 DIAGNOSIS — M5413 Radiculopathy, cervicothoracic region: Secondary | ICD-10-CM | POA: Diagnosis not present

## 2022-08-17 DIAGNOSIS — M9902 Segmental and somatic dysfunction of thoracic region: Secondary | ICD-10-CM | POA: Diagnosis not present

## 2022-08-22 ENCOUNTER — Telehealth: Payer: Self-pay | Admitting: Physical Medicine and Rehabilitation

## 2022-08-22 NOTE — Telephone Encounter (Signed)
Spoke with patient and scheduled NCV for 08/24/22.

## 2022-08-22 NOTE — Telephone Encounter (Signed)
Patient returned call asked for a call back to schedule an appointment with Dr. Alvester Morin. The number to contact patient is [phone]

## 2022-08-24 ENCOUNTER — Ambulatory Visit: Payer: 59 | Admitting: Physical Medicine and Rehabilitation

## 2022-08-24 ENCOUNTER — Ambulatory Visit
Admission: RE | Admit: 2022-08-24 | Discharge: 2022-08-24 | Disposition: A | Discharge status: Home / Self Care | Payer: 59 | Source: Ambulatory Visit | Attending: Orthopedic Surgery | Admitting: Orthopedic Surgery

## 2022-08-24 DIAGNOSIS — M25511 Pain in right shoulder: Secondary | ICD-10-CM

## 2022-08-24 DIAGNOSIS — M67813 Other specified disorders of tendon, right shoulder: Secondary | ICD-10-CM | POA: Diagnosis not present

## 2022-08-24 DIAGNOSIS — R202 Paresthesia of skin: Secondary | ICD-10-CM | POA: Diagnosis not present

## 2022-08-24 DIAGNOSIS — M19011 Primary osteoarthritis, right shoulder: Secondary | ICD-10-CM | POA: Diagnosis not present

## 2022-08-24 MED ORDER — IOPAMIDOL (ISOVUE-M 200) INJECTION 41%
15.0000 mL | Freq: Once | INTRAMUSCULAR | Status: AC
  Administered 2022-08-24: 15 mL via INTRA_ARTICULAR

## 2022-08-24 NOTE — Progress Notes (Signed)
Functional Pain Scale - descriptive words and definitions  Distracting (5)    Aware of pain/able to complete some ADL's but limited by pain/sleep is affected and active distractions are only slightly useful. Moderate range order  Average Pain 8  Right handed. Numbness in right thumb, index and middle fingers. Burning in in hand when it gets bad and she doesn't wear the brace

## 2022-08-26 NOTE — Progress Notes (Signed)
Jean Pittman - 62 y.o. female MRN [id number]  Date of birth: 1961-01-29  Office Visit Note: Visit Date: 08/24/2022 PCP: Pincus Sanes, MD Referred by: Cammy Copa, MD  Subjective: Chief Complaint  Patient presents with   Right Hand - Numbness   HPI:  Kearie Proper is a 62 y.o. female who comes in today at the request of Dr. Burnard Bunting for evaluation and management of chronic, worsening and severe pain, numbness and tingling in the Bilateral upper extremities.  Patient is Right hand dominant.   ROS Otherwise per HPI.  Assessment & Plan: Visit Diagnoses:    ICD-10-CM   1. Paresthesia of skin  R20.2 NCV with EMG (electromyography)      Plan: Impression: The above electrodiagnostic study is ABNORMAL and reveals evidence of a moderate bilateral median nerve entrapment at the wrist (carpal tunnel syndrome) affecting sensory and motor components.   There is no significant electrodiagnostic evidence of any other focal nerve entrapment, brachial plexopathy or cervical radiculopathy.   Recommendations: 1.  Follow-up with referring physician. 2.  Continue current management of symptoms. 3.  Continue use of resting splint at night-time and as needed during the day. 4.  Suggest surgical evaluation.  Meds & Orders: No orders of the defined types were placed in this encounter.   Orders Placed This Encounter  Procedures   NCV with EMG (electromyography)    Follow-up: Return for  G. Dorene Grebe, MD.   Procedures: No procedures performed  EMG & NCV Findings: Evaluation of the left median motor and the right median motor nerves showed prolonged distal onset latency (L4.9, R6.2 ms) and decreased conduction velocity (Elbow-Wrist, L49, R45 m/s).  The left median (across palm) sensory and the right median (across palm) sensory nerves showed prolonged distal peak latency (Wrist, L5.3, R5.5 ms) and prolonged distal peak latency (Palm, L2.4, R2.5 ms).  All remaining nerves (as  indicated in the following tables) were within normal limits.  Left vs. Right side comparison data for the median motor nerve indicates abnormal L-R latency difference (1.3 ms).    All examined muscles (as indicated in the following table) showed no evidence of electrical instability.    Impression: The above electrodiagnostic study is ABNORMAL and reveals evidence of a moderate bilateral median nerve entrapment at the wrist (carpal tunnel syndrome) affecting sensory and motor components.   There is no significant electrodiagnostic evidence of any other focal nerve entrapment, brachial plexopathy or cervical radiculopathy.   Recommendations: 1.  Follow-up with referring physician. 2.  Continue current management of symptoms. 3.  Continue use of resting splint at night-time and as needed during the day. 4.  Suggest surgical evaluation.  ___________________________ Naaman Plummer FAAPMR Board Certified, American Board of Physical Medicine and Rehabilitation    Nerve Conduction Studies Anti Sensory Summary Table   Stim Site NR Peak (ms) Norm Peak (ms) P-T Amp (V) Norm P-T Amp Site1 Site2 Delta-P (ms) Dist (cm) Vel (m/s) Norm Vel (m/s)  Left Median Acr Palm Anti Sensory (2nd Digit)  28.7C  Wrist    *5.3 <3.6 25.7 >10 Wrist Palm 2.9 0.0    Palm    *2.4 <2.0 2.5         Right Median Acr Palm Anti Sensory (2nd Digit)  29.4C  Wrist    *5.5 <3.6 19.1 >10 Wrist Palm 3.0 0.0    Palm    *2.5 <2.0 3.7         Right Radial Anti Sensory (  Base 1st Digit)  29.6C  Wrist    2.2 <3.1 40.1  Wrist Base 1st Digit 2.2 0.0    Right Ulnar Anti Sensory (5th Digit)  29.7C  Wrist    3.6 <3.7 26.5 >15.0 Wrist 5th Digit 3.6 14.0 39 >38   Motor Summary Table   Stim Site NR Onset (ms) Norm Onset (ms) O-P Amp (mV) Norm O-P Amp Site1 Site2 Delta-0 (ms) Dist (cm) Vel (m/s) Norm Vel (m/s)  Left Median Motor (Abd Poll Brev)  29.1C  Wrist    *4.9 <4.2 6.6 >5 Elbow Wrist 4.1 20.0 *49 >50  Elbow    9.0  6.5          Right Median Motor (Abd Poll Brev)  29.6C  Wrist    *6.2 <4.2 7.3 >5 Elbow Wrist 4.2 19.0 *45 >50  Elbow    10.4  7.2         Right Ulnar Motor (Abd Dig Min)  29.5C  Wrist    3.4 <4.2 4.4 >3 B Elbow Wrist 2.9 19.0 66 >53  B Elbow    6.3  5.1  A Elbow B Elbow 1.3 11.0 85 >53  A Elbow    7.6  5.5          EMG   Side Muscle Nerve Root Ins Act Fibs Psw Amp Dur Poly Recrt Int Dennie Bible Comment  Right Abd Poll Brev Median C8-T1 Nml Nml Nml Nml Nml 0 Nml Nml   Right 1stDorInt Ulnar C8-T1 Nml Nml Nml Nml Nml 0 Nml Nml   Right PronatorTeres Median C6-7 Nml Nml Nml Nml Nml 0 Nml Nml   Right Biceps Musculocut C5-6 Nml Nml Nml Nml Nml 0 Nml Nml   Right Deltoid Axillary C5-6 Nml Nml Nml Nml Nml 0 Nml Nml     Nerve Conduction Studies Anti Sensory Left/Right Comparison   Stim Site L Lat (ms) R Lat (ms) L-R Lat (ms) L Amp (V) R Amp (V) L-R Amp (%) Site1 Site2 L Vel (m/s) R Vel (m/s) L-R Vel (m/s)  Median Acr Palm Anti Sensory (2nd Digit)  28.7C  Wrist *5.3 *5.5 0.2 25.7 19.1 25.7 Wrist Palm     Palm *2.4 *2.5 0.1 2.5 3.7 32.4       Radial Anti Sensory (Base 1st Digit)  29.6C  Wrist  2.2   40.1  Wrist Base 1st Digit     Ulnar Anti Sensory (5th Digit)  29.7C  Wrist  3.6   26.5  Wrist 5th Digit  39    Motor Left/Right Comparison   Stim Site L Lat (ms) R Lat (ms) L-R Lat (ms) L Amp (mV) R Amp (mV) L-R Amp (%) Site1 Site2 L Vel (m/s) R Vel (m/s) L-R Vel (m/s)  Median Motor (Abd Poll Brev)  29.1C  Wrist *4.9 *6.2 *1.3 6.6 7.3 9.6 Elbow Wrist *49 *45 4  Elbow 9.0 10.4 1.4 6.5 7.2 9.7       Ulnar Motor (Abd Dig Min)  29.5C  Wrist  3.4   4.4  B Elbow Wrist  66   B Elbow  6.3   5.1  A Elbow B Elbow  85   A Elbow  7.6   5.5           Waveforms:                Clinical History: No specialty comments available.     Objective:  VS:  HT:    WT:   BMI:  BP:   HR: bpm  TEMP: ( )  RESP:  Physical Exam Musculoskeletal:        General: No swelling, tenderness or deformity.      Comments: Inspection reveals no atrophy of the bilateral APB or FDI or hand intrinsics. There is no swelling, color changes, allodynia or dystrophic changes. There is 5 out of 5 strength in the bilateral wrist extension, finger abduction and long finger flexion. There is intact sensation to light touch in all dermatomal and peripheral nerve distributions. There is a negative Tinel's test at the bilateral wrist and elbow. There is a positive Phalen's test bilaterally. There is a negative Hoffmann's test bilaterally.  Skin:    General: Skin is warm and dry.     Findings: No erythema or rash.  Neurological:     General: No focal deficit present.     Mental Status: She is alert and oriented to person, place, and time.     Motor: No weakness or abnormal muscle tone.     Coordination: Coordination normal.  Psychiatric:        Mood and Affect: Mood normal.        Behavior: Behavior normal.      Imaging: No results found.

## 2022-08-26 NOTE — Procedures (Signed)
EMG & NCV Findings: Evaluation of the left median motor and the right median motor nerves showed prolonged distal onset latency (L4.9, R6.2 ms) and decreased conduction velocity (Elbow-Wrist, L49, R45 m/s).  The left median (across palm) sensory and the right median (across palm) sensory nerves showed prolonged distal peak latency (Wrist, L5.3, R5.5 ms) and prolonged distal peak latency (Palm, L2.4, R2.5 ms).  All remaining nerves (as indicated in the following tables) were within normal limits.  Left vs. Right side comparison data for the median motor nerve indicates abnormal L-R latency difference (1.3 ms).    All examined muscles (as indicated in the following table) showed no evidence of electrical instability.    Impression: The above electrodiagnostic study is ABNORMAL and reveals evidence of a moderate bilateral median nerve entrapment at the wrist (carpal tunnel syndrome) affecting sensory and motor components.   There is no significant electrodiagnostic evidence of any other focal nerve entrapment, brachial plexopathy or cervical radiculopathy.   Recommendations: 1.  Follow-up with referring physician. 2.  Continue current management of symptoms. 3.  Continue use of resting splint at night-time and as needed during the day. 4.  Suggest surgical evaluation.  ___________________________ Naaman Plummer FAAPMR Board Certified, American Board of Physical Medicine and Rehabilitation    Nerve Conduction Studies Anti Sensory Summary Table   Stim Site NR Peak (ms) Norm Peak (ms) P-T Amp (V) Norm P-T Amp Site1 Site2 Delta-P (ms) Dist (cm) Vel (m/s) Norm Vel (m/s)  Left Median Acr Palm Anti Sensory (2nd Digit)  28.7C  Wrist    *5.3 <3.6 25.7 >10 Wrist Palm 2.9 0.0    Palm    *2.4 <2.0 2.5         Right Median Acr Palm Anti Sensory (2nd Digit)  29.4C  Wrist    *5.5 <3.6 19.1 >10 Wrist Palm 3.0 0.0    Palm    *2.5 <2.0 3.7         Right Radial Anti Sensory (Base 1st Digit)  29.6C  Wrist     2.2 <3.1 40.1  Wrist Base 1st Digit 2.2 0.0    Right Ulnar Anti Sensory (5th Digit)  29.7C  Wrist    3.6 <3.7 26.5 >15.0 Wrist 5th Digit 3.6 14.0 39 >38   Motor Summary Table   Stim Site NR Onset (ms) Norm Onset (ms) O-P Amp (mV) Norm O-P Amp Site1 Site2 Delta-0 (ms) Dist (cm) Vel (m/s) Norm Vel (m/s)  Left Median Motor (Abd Poll Brev)  29.1C  Wrist    *4.9 <4.2 6.6 >5 Elbow Wrist 4.1 20.0 *49 >50  Elbow    9.0  6.5         Right Median Motor (Abd Poll Brev)  29.6C  Wrist    *6.2 <4.2 7.3 >5 Elbow Wrist 4.2 19.0 *45 >50  Elbow    10.4  7.2         Right Ulnar Motor (Abd Dig Min)  29.5C  Wrist    3.4 <4.2 4.4 >3 B Elbow Wrist 2.9 19.0 66 >53  B Elbow    6.3  5.1  A Elbow B Elbow 1.3 11.0 85 >53  A Elbow    7.6  5.5          EMG   Side Muscle Nerve Root Ins Act Fibs Psw Amp Dur Poly Recrt Int Dennie Bible Comment  Right Abd Poll Brev Median C8-T1 Nml Nml Nml Nml Nml 0 Nml Nml   Right 1stDorInt Ulnar C8-T1 Nml Nml  Nml Nml Nml 0 Nml Nml   Right PronatorTeres Median C6-7 Nml Nml Nml Nml Nml 0 Nml Nml   Right Biceps Musculocut C5-6 Nml Nml Nml Nml Nml 0 Nml Nml   Right Deltoid Axillary C5-6 Nml Nml Nml Nml Nml 0 Nml Nml     Nerve Conduction Studies Anti Sensory Left/Right Comparison   Stim Site L Lat (ms) R Lat (ms) L-R Lat (ms) L Amp (V) R Amp (V) L-R Amp (%) Site1 Site2 L Vel (m/s) R Vel (m/s) L-R Vel (m/s)  Median Acr Palm Anti Sensory (2nd Digit)  28.7C  Wrist *5.3 *5.5 0.2 25.7 19.1 25.7 Wrist Palm     Palm *2.4 *2.5 0.1 2.5 3.7 32.4       Radial Anti Sensory (Base 1st Digit)  29.6C  Wrist  2.2   40.1  Wrist Base 1st Digit     Ulnar Anti Sensory (5th Digit)  29.7C  Wrist  3.6   26.5  Wrist 5th Digit  39    Motor Left/Right Comparison   Stim Site L Lat (ms) R Lat (ms) L-R Lat (ms) L Amp (mV) R Amp (mV) L-R Amp (%) Site1 Site2 L Vel (m/s) R Vel (m/s) L-R Vel (m/s)  Median Motor (Abd Poll Brev)  29.1C  Wrist *4.9 *6.2 *1.3 6.6 7.3 9.6 Elbow Wrist *49 *45 4  Elbow 9.0 10.4  1.4 6.5 7.2 9.7       Ulnar Motor (Abd Dig Min)  29.5C  Wrist  3.4   4.4  B Elbow Wrist  66   B Elbow  6.3   5.1  A Elbow B Elbow  85   A Elbow  7.6   5.5           Waveforms:

## 2022-08-29 DIAGNOSIS — M9901 Segmental and somatic dysfunction of cervical region: Secondary | ICD-10-CM | POA: Diagnosis not present

## 2022-08-29 DIAGNOSIS — M9904 Segmental and somatic dysfunction of sacral region: Secondary | ICD-10-CM | POA: Diagnosis not present

## 2022-08-29 DIAGNOSIS — M9903 Segmental and somatic dysfunction of lumbar region: Secondary | ICD-10-CM | POA: Diagnosis not present

## 2022-08-29 DIAGNOSIS — M5459 Other low back pain: Secondary | ICD-10-CM | POA: Diagnosis not present

## 2022-08-29 DIAGNOSIS — M542 Cervicalgia: Secondary | ICD-10-CM | POA: Diagnosis not present

## 2022-08-29 DIAGNOSIS — M461 Sacroiliitis, not elsewhere classified: Secondary | ICD-10-CM | POA: Diagnosis not present

## 2022-09-05 ENCOUNTER — Ambulatory Visit: Payer: 59 | Admitting: Orthopedic Surgery

## 2022-09-05 DIAGNOSIS — M25511 Pain in right shoulder: Secondary | ICD-10-CM | POA: Diagnosis not present

## 2022-09-05 DIAGNOSIS — M7542 Impingement syndrome of left shoulder: Secondary | ICD-10-CM

## 2022-09-05 DIAGNOSIS — M7541 Impingement syndrome of right shoulder: Secondary | ICD-10-CM | POA: Diagnosis not present

## 2022-09-05 DIAGNOSIS — M25819 Other specified joint disorders, unspecified shoulder: Secondary | ICD-10-CM | POA: Diagnosis not present

## 2022-09-05 DIAGNOSIS — R202 Paresthesia of skin: Secondary | ICD-10-CM

## 2022-09-06 ENCOUNTER — Encounter: Payer: Self-pay | Admitting: Orthopedic Surgery

## 2022-09-06 MED ORDER — LIDOCAINE HCL 1 % IJ SOLN
5.00 mL | INTRAMUSCULAR | Status: AC | PRN
Start: 2022-09-05 — End: 2022-09-05
  Administered 2022-09-05: 5 mL

## 2022-09-06 MED ORDER — BUPIVACAINE HCL 0.5 % IJ SOLN
9.00 mL | INTRAMUSCULAR | Status: AC | PRN
Start: 2022-09-05 — End: 2022-09-05
  Administered 2022-09-05: 9 mL via INTRA_ARTICULAR

## 2022-09-06 MED ORDER — METHYLPREDNISOLONE ACETATE 40 MG/ML IJ SUSP
40.00 mg | INTRAMUSCULAR | Status: AC | PRN
Start: 2022-09-05 — End: 2022-09-05
  Administered 2022-09-05: 40 mg via INTRA_ARTICULAR

## 2022-09-06 MED ORDER — METHYLPREDNISOLONE ACETATE 40 MG/ML IJ SUSP
40.0000 mg | INTRAMUSCULAR | Status: AC | PRN
Start: 2022-09-05 — End: 2022-09-05
  Administered 2022-09-05: 40 mg via INTRA_ARTICULAR

## 2022-09-06 NOTE — Progress Notes (Signed)
Hello Jean Pittman manage of her  Office Visit Note   Patient: Jean Pittman           Date of Birth: 11-16-1960           MRN: [id number] Visit Date: 09/05/2022 Requested by: Pincus Sanes, MD [address] [place],  [place] [address] PCP: Pincus Sanes, MD  Subjective: Chief Complaint  Patient presents with   Other     Review EMG/NCV and MRI    HPI: Jean Pittman is a 62 y.o. female who presents to the office reporting bilateral shoulder pain as well as bilateral carpal tunnel syndrome.  Since she was last seen she had an MRI scan of the right shoulder which shows moderate rotator cuff tendinosis with no rotator cuff tear.  Labrum and AC joint appear intact.  Nerve study of both hands shows moderate carpal tunnel syndrome in both wrists.  Her right wrist is more symptomatic.  Review of the records demonstrates that she had right subacromial injection in October 2023 which helped had left subacromial injection in December 2023 which also helped.  Also has a history of physical therapy.  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  Localizes pain to the deltoid region.  She also describes numbness and tingling digits 1 2 and 3 on the right and left-hand side.  A little bit worse on the right hand.  Reports loss of dexterity and daily symptoms on the right-hand side.  Takes Tylenol and ibuprofen which helps some over the past 2 months.              ROS: All systems reviewed are negative as they relate to the chief complaint within the history of present illness.  Patient denies fevers or chills.  Assessment & Plan: Visit Diagnoses:  1. Paresthesia of skin   2. Acute pain of right shoulder   3. Impingement syndrome of left shoulder   4. Shoulder impingement   5. Impingement syndrome of right shoulder     Plan: Impression is bilateral shoulder impingement.  Bilateral shoulder injections performed today.  Continue with exercises but no lifting overhead.   Patient also has carpal tunnel syndrome which she will consider surgical intervention for.  Debbie's card is provided.  The risk and benefits of carpal tunnel surgery discussed.  All questions answered.  Follow-Up Instructions: No follow-ups on file.   Orders:  No orders of the defined types were placed in this encounter.  No orders of the defined types were placed in this encounter.     Procedures: Large Joint Inj: R subacromial bursa on 09/05/2022 10:29 PM Indications: diagnostic evaluation and pain Details: 18 G 1.5 in needle, posterior approach  Arthrogram: No  Medications: Pittman mL bupivacaine 0.5 %; 40 mg methylPREDNISolone acetate 40 MG/ML; 5 mL lidocaine 1 % Outcome: tolerated well, no immediate complications Procedure, treatment alternatives, risks and benefits explained, specific risks discussed. Consent was given by the patient. Immediately prior to procedure a time out was called to verify the correct patient, procedure, equipment, support staff and site/side marked as required. Patient was prepped and draped in the usual sterile fashion.    Large Joint Inj: L subacromial bursa on 09/05/2022 10:29 PM Indications: diagnostic evaluation and pain Details: 18 G 1.5 in needle, posterior approach  Arthrogram: No  Medications: Pittman mL bupivacaine 0.5 %; 40 mg methylPREDNISolone acetate 40 MG/ML; 5 mL lidocaine 1 % Outcome: tolerated  well, no immediate complications Procedure, treatment alternatives, risks and benefits explained, specific risks discussed. Consent was given by the patient. Immediately prior to procedure a time out was called to verify the correct patient, procedure, equipment, support staff and site/side marked as required. Patient was prepped and draped in the usual sterile fashion.       Clinical Data: No additional findings.  Objective: Vital Signs: LMP 07/01/2013   Physical Exam:  Constitutional: Patient appears well-developed HEENT:  Head:  Normocephalic Eyes:EOM are normal Neck: Normal range of motion Cardiovascular: Normal rate Pulmonary/chest: Effort normal Neurologic: Patient is alert Skin: Skin is warm Psychiatric: Patient has normal mood and affect  Ortho Exam: Ortho exam demonstrates no discrete AC joint tenderness in either shoulder.  Neck range of motion intact.  Motor or sensory function of both hands intact.  No masses lymphadenopathy or skin changes noted in the shoulder regions.  Excellent rotator cuff strength demonstrate supraspinatus and subscap muscle testing.  Bilateral passive range of motion is about 60/95/175.  No coarse grinding or crepitus with internal and external rotation on either side with the room and 90 degrees of abduction.  Bilateral hand examination demonstrates palpable radial pulses.  No abductor pollicis brevis wasting with good functional strength.  Does report some paresthesias on the right-hand side with numbness and tingling in digits 1 2 and 3 primarily palmar.  Specialty Comments:  No specialty comments available.  Imaging: No results found.   PMFS History: Patient Active Problem List   Diagnosis Date Noted   Impingement syndrome of left shoulder 02/02/2022   Acute pain of right shoulder 11/23/2021   Decreased hearing of right ear 01/19/2021   Acute non-recurrent sinusitis 01/19/2021   Hyperglycemia 09/20/2018   Upper airway cough syndrome 01/10/2017   Chronic cough 03/22/2016   EKG abnormalities 02/15/2012   ADENOMATOUS COLONIC POLYP 04/16/2010   REACTIVE AIRWAY DISEASE 04/16/2010   Hyperlipidemia 03/12/2009   Past Medical History:  Diagnosis Date   Arthritis    Dizziness    Hyperlipidemia    denies   Tubular adenoma of colon 03/30/09   Dr Jarold Motto    Family History  Problem Relation Age of Onset   Colon cancer Mother    Cancer - Colon Mother    Heart disease Father    Hyperlipidemia Father    Stroke Maternal Aunt        ? . one M aunt;> 65   Colon cancer  Maternal Aunt    Colon cancer Maternal Aunt    Breast cancer Paternal Aunt    Diabetes Paternal Aunt    Esophageal cancer Neg Hx    Stomach cancer Neg Hx    Rectal cancer Neg Hx     Past Surgical History:  Procedure Laterality Date   APPENDECTOMY     with 1st pregnancy   BRAVO PH STUDY N/A 11/30/2017   Procedure: BRAVO PH STUDY;  Surgeon: Rachael Fee, MD;  Location: [facility];  Service: Endoscopy;  Laterality: N/A;   BREAST BIOPSY     needle aspiration X1; resection X 1   colonoscopy with polypectomy  03/30/2009   Dr Jarold Motto   CT SCAN  2017   Chest due to a possible blood clot but came back normal   ESOPHAGOGASTRODUODENOSCOPY (EGD) WITH PROPOFOL N/A 11/30/2017   Procedure: ESOPHAGOGASTRODUODENOSCOPY (EGD) WITH PROPOFOL;  Surgeon: Rachael Fee, MD;  Location: [facility];  Service: Endoscopy;  Laterality: N/A;  48 hour Ph probe   frozen shoulder Left  broke the cartilage-was put to sleep Dr. Norlene Campbell apox 2014   G 3 P 3     3 C sections   TUBAL LIGATION     Social History   Occupational History   Not on file  Tobacco Use   Smoking status: Never   Smokeless tobacco: Never  Vaping Use   Vaping Use: Never used  Substance and Sexual Activity   Alcohol use: No   Drug use: No   Sexual activity: Not Currently    Partners: Male    Birth control/protection: Post-menopausal    Comment: 1st intercourse- 54, partners- 2, married- 19 yrs

## 2022-09-12 DIAGNOSIS — H04123 Dry eye syndrome of bilateral lacrimal glands: Secondary | ICD-10-CM | POA: Diagnosis not present

## 2022-09-12 DIAGNOSIS — H5213 Myopia, bilateral: Secondary | ICD-10-CM | POA: Diagnosis not present

## 2022-09-14 DIAGNOSIS — M461 Sacroiliitis, not elsewhere classified: Secondary | ICD-10-CM | POA: Diagnosis not present

## 2022-09-14 DIAGNOSIS — M5413 Radiculopathy, cervicothoracic region: Secondary | ICD-10-CM | POA: Diagnosis not present

## 2022-09-14 DIAGNOSIS — M9904 Segmental and somatic dysfunction of sacral region: Secondary | ICD-10-CM | POA: Diagnosis not present

## 2022-09-14 DIAGNOSIS — M9902 Segmental and somatic dysfunction of thoracic region: Secondary | ICD-10-CM | POA: Diagnosis not present

## 2022-09-14 DIAGNOSIS — M5412 Radiculopathy, cervical region: Secondary | ICD-10-CM | POA: Diagnosis not present

## 2022-09-14 DIAGNOSIS — M9901 Segmental and somatic dysfunction of cervical region: Secondary | ICD-10-CM | POA: Diagnosis not present

## 2022-10-06 ENCOUNTER — Telehealth: Payer: Self-pay | Admitting: Orthopedic Surgery

## 2022-10-06 DIAGNOSIS — M25511 Pain in right shoulder: Secondary | ICD-10-CM | POA: Diagnosis not present

## 2022-10-06 DIAGNOSIS — M62838 Other muscle spasm: Secondary | ICD-10-CM | POA: Diagnosis not present

## 2022-10-06 DIAGNOSIS — M5413 Radiculopathy, cervicothoracic region: Secondary | ICD-10-CM | POA: Diagnosis not present

## 2022-10-06 DIAGNOSIS — M9902 Segmental and somatic dysfunction of thoracic region: Secondary | ICD-10-CM | POA: Diagnosis not present

## 2022-10-06 DIAGNOSIS — M5412 Radiculopathy, cervical region: Secondary | ICD-10-CM | POA: Diagnosis not present

## 2022-10-06 DIAGNOSIS — M9901 Segmental and somatic dysfunction of cervical region: Secondary | ICD-10-CM | POA: Diagnosis not present

## 2022-10-06 NOTE — Telephone Encounter (Signed)
Patient called said she got the injection about a month ago but now she is severe pain. She wants to know what to do. CB#[phone]

## 2022-10-07 DIAGNOSIS — M25511 Pain in right shoulder: Secondary | ICD-10-CM | POA: Diagnosis not present

## 2022-11-07 ENCOUNTER — Other Ambulatory Visit: Payer: Self-pay | Admitting: Obstetrics and Gynecology

## 2022-11-07 DIAGNOSIS — Z1231 Encounter for screening mammogram for malignant neoplasm of breast: Secondary | ICD-10-CM

## 2022-11-29 ENCOUNTER — Encounter: Payer: Self-pay | Admitting: Internal Medicine

## 2022-11-29 NOTE — Progress Notes (Unsigned)
Subjective:    Patient ID: Jean Pittman, female    DOB: September 16, 1960, 62 y.o.   MRN: [id number]      HPI Jean Pittman is here for No chief complaint on file.   Fatigue, dizzy, lightheadedness, need to take a deep breath -- started a few months ago.    No labs in over 2 yrs  Medications and allergies reviewed with patient and updated if appropriate.  Current Outpatient Medications on File Prior to Visit  Medication Sig Dispense Refill   Ascorbic Acid (VITAMIN C PO) Take by mouth.     Cyanocobalamin (VITAMIN B-12) 500 MCG SUBL Place under the tongue.     pseudoephedrine (SUDAFED) 30 MG tablet Take 30 mg by mouth every 4 (four) hours as needed for congestion.     No current facility-administered medications on file prior to visit.    Review of Systems     Objective:  There were no vitals filed for this visit. BP Readings from Last 3 Encounters:  05/30/22 126/82  12/06/21 127/77  11/29/21 (!) 140/86   Wt Readings from Last 3 Encounters:  05/30/22 142 lb (64.4 kg)  12/06/21 139 lb (63 kg)  11/29/21 139 lb 12.8 oz (63.4 kg)   There is no height or weight on file to calculate BMI.    Physical Exam         Assessment & Plan:    See Problem List for Assessment and Plan of chronic medical problems.

## 2022-11-29 NOTE — Patient Instructions (Incomplete)
     An EKG was done today.   Blood work was ordered.   The lab is on the first floor.    Medications changes include :  none    Take an multivitamin and continue your vitamin B12.      Return if symptoms worsen or fail to improve.

## 2022-11-30 ENCOUNTER — Ambulatory Visit: Payer: 59 | Admitting: Internal Medicine

## 2022-11-30 VITALS — BP 124/88 | HR 53 | Temp 98.0°F | Ht 65.5 in | Wt 142.2 lb

## 2022-11-30 DIAGNOSIS — R5383 Other fatigue: Secondary | ICD-10-CM

## 2022-11-30 DIAGNOSIS — R42 Dizziness and giddiness: Secondary | ICD-10-CM | POA: Diagnosis not present

## 2022-11-30 DIAGNOSIS — R0602 Shortness of breath: Secondary | ICD-10-CM | POA: Diagnosis not present

## 2022-11-30 LAB — COMPREHENSIVE METABOLIC PANEL
ALT: 11 U/L (ref 0–35)
AST: 16 U/L (ref 0–37)
Albumin: 4.4 g/dL (ref 3.5–5.2)
Alkaline Phosphatase: 27 U/L — ABNORMAL LOW (ref 39–117)
BUN: 18 mg/dL (ref 6–23)
CO2: 27 meq/L (ref 19–32)
Calcium: 9.6 mg/dL (ref 8.4–10.5)
Chloride: 106 meq/L (ref 96–112)
Creatinine, Ser: 0.89 mg/dL (ref 0.40–1.20)
GFR: 68.85 mL/min (ref >60.00)
Glucose, Bld: 97 mg/dL (ref 70–99)
Potassium: 4.4 meq/L (ref 3.5–5.1)
Sodium: 140 meq/L (ref 135–145)
Total Bilirubin: 0.4 mg/dL (ref 0.2–1.2)
Total Protein: 7.2 g/dL (ref 6.0–8.3)

## 2022-11-30 LAB — CBC WITH DIFFERENTIAL/PLATELET
Basophils Absolute: 0.1 10*3/uL (ref 0.0–0.1)
Basophils Relative: 1.2 % (ref 0.0–3.0)
Eosinophils Absolute: 0.2 10*3/uL (ref 0.0–0.7)
Eosinophils Relative: 3.5 % (ref 0.0–5.0)
HCT: 39.9 % (ref 36.0–46.0)
Hemoglobin: 13.2 g/dL (ref 12.0–15.0)
Lymphocytes Relative: 26.1 % (ref 12.0–46.0)
Lymphs Abs: 1.5 10*3/uL (ref 0.7–4.0)
MCHC: 33.1 g/dL (ref 30.0–36.0)
MCV: 90.9 fL (ref 78.0–100.0)
Monocytes Absolute: 0.5 10*3/uL (ref 0.1–1.0)
Monocytes Relative: 8.2 % (ref 3.0–12.0)
Neutro Abs: 3.4 10*3/uL (ref 1.4–7.7)
Neutrophils Relative %: 61 % (ref 43.0–77.0)
Platelets: 244 10*3/uL (ref 150.0–400.0)
RBC: 4.39 Mil/uL (ref 3.87–5.11)
RDW: 13.8 % (ref 11.5–15.5)
WBC: 5.6 10*3/uL (ref 4.0–10.5)

## 2022-11-30 LAB — TSH: TSH: 2.44 u[IU]/mL (ref 0.35–5.50)

## 2022-11-30 NOTE — Assessment & Plan Note (Signed)
Subacute Has to have shortness of breath at rest and not with exertion Describes it as more of a need to take a big deep breath Discussed this most likely is stress related Discussed that we can consider as needed medication or daily medication Encouraged her to continue regular exercise which she is able to do which will likely help

## 2022-11-30 NOTE — Assessment & Plan Note (Addendum)
Subacute Past couple months has been having episodic lightheadedness/dizziness Often occurs when bending over and going back up or when standing after sitting or laying for long periods of time Could be orthostatic in nature Encouraged her to drink more water Blood work including CBC, CMP, TSH  EKG: Sinus bradycardia 53 bpm, right bundle branch block, otherwise normal.Compared to last EKG from 02/19/2016 there is no significant change.

## 2022-11-30 NOTE — Assessment & Plan Note (Signed)
Subacute Started about 2 months ago Could be multifactorial in nature Sleep is okay, but interrupted-she has a new puppy There is also some underlying stress which could be contributing Concern for possible anemia given regular ibuprofen use-will be checking blood work including CBC, CMP and TSH Has not been able to exercises regularly which may also be contributing to some of his fatigue Check EKG

## 2022-12-02 ENCOUNTER — Encounter: Payer: Self-pay | Admitting: Internal Medicine

## 2022-12-07 ENCOUNTER — Ambulatory Visit
Admission: RE | Admit: 2022-12-07 | Discharge: 2022-12-07 | Disposition: A | Discharge status: Home / Self Care | Payer: 59 | Source: Ambulatory Visit | Attending: Obstetrics and Gynecology | Admitting: Obstetrics and Gynecology

## 2022-12-07 DIAGNOSIS — Z1231 Encounter for screening mammogram for malignant neoplasm of breast: Secondary | ICD-10-CM | POA: Diagnosis not present

## 2022-12-19 DIAGNOSIS — D692 Other nonthrombocytopenic purpura: Secondary | ICD-10-CM | POA: Diagnosis not present

## 2022-12-19 DIAGNOSIS — D225 Melanocytic nevi of trunk: Secondary | ICD-10-CM | POA: Diagnosis not present

## 2022-12-19 DIAGNOSIS — L814 Other melanin hyperpigmentation: Secondary | ICD-10-CM | POA: Diagnosis not present

## 2022-12-19 DIAGNOSIS — L700 Acne vulgaris: Secondary | ICD-10-CM | POA: Diagnosis not present

## 2022-12-19 DIAGNOSIS — L578 Other skin changes due to chronic exposure to nonionizing radiation: Secondary | ICD-10-CM | POA: Diagnosis not present

## 2022-12-19 DIAGNOSIS — L821 Other seborrheic keratosis: Secondary | ICD-10-CM | POA: Diagnosis not present

## 2022-12-19 DIAGNOSIS — D1801 Hemangioma of skin and subcutaneous tissue: Secondary | ICD-10-CM | POA: Diagnosis not present

## 2022-12-19 DIAGNOSIS — Z85828 Personal history of other malignant neoplasm of skin: Secondary | ICD-10-CM | POA: Diagnosis not present

## 2023-01-24 DIAGNOSIS — J0100 Acute maxillary sinusitis, unspecified: Secondary | ICD-10-CM | POA: Diagnosis not present

## 2023-01-24 DIAGNOSIS — J209 Acute bronchitis, unspecified: Secondary | ICD-10-CM | POA: Diagnosis not present

## 2023-02-11 DIAGNOSIS — J209 Acute bronchitis, unspecified: Secondary | ICD-10-CM | POA: Diagnosis not present

## 2023-02-11 DIAGNOSIS — R5383 Other fatigue: Secondary | ICD-10-CM | POA: Diagnosis not present

## 2023-05-30 ENCOUNTER — Encounter: Payer: Self-pay | Admitting: Internal Medicine

## 2023-05-30 NOTE — Patient Instructions (Addendum)

## 2023-05-30 NOTE — Progress Notes (Unsigned)
 Subjective:    Patient ID: Jean Pittman, female    DOB: March 20, 1960, 63 y.o.   MRN: [id number]      HPI Jean Pittman is here for a Physical exam and her chronic medical problems.   Chronic cough - better than when it started -- Sudafed helps with cough but it messes up her taste buds.  Has been using some herbal teas and that seems to help a little.      Still has issues with shoulders - ROM is good, but pain and difficulty with left shoulder with certain movements.   Medications and allergies reviewed with patient and updated if appropriate.  Current Outpatient Medications on File Prior to Visit  Medication Sig Dispense Refill   Apoaequorin (PREVAGEN) 10 MG CAPS      BLACK CURRANT SEED OIL PO Take by mouth.     Cyanocobalamin 1000 MCG CAPS Take by mouth.     Flaxseed, Linseed, (FLAXSEED OIL PO) Take by mouth.     IBUPROFEN PO Take by mouth.     Magnesium 400 MG CAPS Take by mouth.     Melatonin 10 MG CHEW Chew by mouth.     Multiple Vitamin (MULTIVITAMIN ADULT PO) Take by mouth.     pseudoephedrine (SUDAFED) 30 MG tablet Take 30 mg by mouth every 4 (four) hours as needed for congestion.     No current facility-administered medications on file prior to visit.    Review of Systems  Constitutional:  Negative for fever.  Eyes:  Negative for visual disturbance.  Respiratory:  Positive for cough (chronic). Negative for shortness of breath and wheezing.   Cardiovascular:  Negative for chest pain, palpitations and leg swelling.  Gastrointestinal:  Positive for constipation (takes magnesium). Negative for abdominal pain, blood in stool and diarrhea.       No gerd  Genitourinary:  Negative for dysuria.  Musculoskeletal:  Positive for arthralgias (shoulders - mild, hands). Negative for back pain.       CTS right side  Skin:  Negative for rash.       Dry skin  Neurological:  Positive for light-headedness (if she gets up quick). Negative for headaches.  Psychiatric/Behavioral:   Negative for dysphoric mood and sleep disturbance. The patient is not nervous/anxious.        Objective:   Vitals:   05/31/23 1325  BP: 116/76  Pulse: (!) 57  Temp: 98.1 F (36.7 C)  SpO2: 97%   Filed Weights   05/31/23 1325  Weight: 145 lb (65.8 kg)   Body mass index is 23.76 kg/m.  BP Readings from Last 3 Encounters:  05/31/23 116/76  11/30/22 124/88  05/30/22 126/82    Wt Readings from Last 3 Encounters:  05/31/23 145 lb (65.8 kg)  11/30/22 142 lb 3.2 oz (64.5 kg)  05/30/22 142 lb (64.4 kg)       Physical Exam Constitutional: She appears well-developed and well-nourished. No distress.  HENT:  Head: Normocephalic and atraumatic.  Right Ear: External ear normal. Normal ear canal and TM Left Ear: External ear normal.  Normal ear canal and TM Mouth/Throat: Oropharynx is clear and moist.  Eyes: Conjunctivae normal.  Neck: Neck supple. No tracheal deviation present. No thyromegaly present.  No carotid bruit  Cardiovascular: Normal rate, regular rhythm and normal heart sounds.   No murmur heard.  No edema. Pulmonary/Chest: Effort normal and breath sounds normal. No respiratory distress. She has no wheezes. She has no rales.  Breast: deferred  Abdominal: Soft. She exhibits no distension. There is no tenderness.  Lymphadenopathy: She has no cervical adenopathy.  Skin: Skin is warm and dry. She is not diaphoretic.  Psychiatric: She has a normal mood and affect. Her behavior is normal.     Lab Results  Component Value Date   WBC 5.6 11/30/2022   HGB 13.2 11/30/2022   HCT 39.9 11/30/2022   PLT 244.0 11/30/2022   GLUCOSE 97 11/30/2022   CHOL 222 (H) 10/22/2020   TRIG 90.0 10/22/2020   HDL 61.60 10/22/2020   LDLDIRECT 133.4 02/15/2012   LDLCALC 143 (H) 10/22/2020   ALT 11 11/30/2022   AST 16 11/30/2022   NA 140 11/30/2022   K 4.4 11/30/2022   CL 106 11/30/2022   CREATININE 0.89 11/30/2022   BUN 18 11/30/2022   CO2 27 11/30/2022   TSH 2.44 11/30/2022    INR 1.0 10/12/2015   HGBA1C 5.6 10/22/2020    The 10-year ASCVD risk score (Arnett DK, et al., 2019) is: 4.2%   Values used to calculate the score:     Age: 2 years     Sex: Female     Is Non-Hispanic African American: No     Diabetic: No     Tobacco smoker: No     Systolic Blood Pressure: 116 mmHg     Is BP treated: No     HDL Cholesterol: 61.6 mg/dL     Total Cholesterol: 222 mg/dL      Assessment & Plan:   Physical exam: Screening blood work  ordered Exercise  regular Weight  normal Substance abuse  none Not a great eater - eats seafood, veges, fruits, nuts, chicken - occ beef, smoothies w/ protein powder.  Does not always know what she feels like eating or wanting to eat No etoh, mountain dew - decreased to TIW  Reviewed recommended immunizations.   Health Maintenance  Topic Date Due   Pneumococcal Vaccine 99-32 Years old (2 of 2 - PCV) 04/17/2011   COVID-19 Vaccine (1 - 2024-25 season) Never done   INFLUENZA VACCINE  09/29/2023   MAMMOGRAM  12/06/2024   Cervical Cancer Screening (HPV/Pap Cotest)  05/29/2025   Colonoscopy  12/07/2026   DEXA SCAN  01/06/2027   DTaP/Tdap/Td (3 - Td or Tdap) 10/20/2029   Hepatitis C Screening  Completed   HIV Screening  Completed   Zoster Vaccines- Shingrix  Completed   HPV VACCINES  Aged Out          See Problem List for Assessment and Plan of chronic medical problems.

## 2023-05-31 ENCOUNTER — Ambulatory Visit (INDEPENDENT_AMBULATORY_CARE_PROVIDER_SITE_OTHER): Payer: 59 | Admitting: Internal Medicine

## 2023-05-31 VITALS — BP 116/76 | HR 57 | Temp 98.1°F | Ht 65.5 in | Wt 145.0 lb

## 2023-05-31 DIAGNOSIS — R739 Hyperglycemia, unspecified: Secondary | ICD-10-CM

## 2023-05-31 DIAGNOSIS — E782 Mixed hyperlipidemia: Secondary | ICD-10-CM

## 2023-05-31 DIAGNOSIS — Z0000 Encounter for general adult medical examination without abnormal findings: Secondary | ICD-10-CM

## 2023-05-31 DIAGNOSIS — G5601 Carpal tunnel syndrome, right upper limb: Secondary | ICD-10-CM

## 2023-05-31 DIAGNOSIS — E559 Vitamin D deficiency, unspecified: Secondary | ICD-10-CM

## 2023-05-31 DIAGNOSIS — R053 Chronic cough: Secondary | ICD-10-CM | POA: Diagnosis not present

## 2023-05-31 NOTE — Assessment & Plan Note (Addendum)
 Chronic Likely neurogenic and from drainage Stable - slightly improved Sudafed helps Drinking herbal teas which seems to help

## 2023-05-31 NOTE — Assessment & Plan Note (Addendum)
 Chronic Check lipid panel, tsh, cmp, cbc Lifestyle controlled Continue exercise and healthy diet encouraged  Can consider Ct cac

## 2023-05-31 NOTE — Assessment & Plan Note (Signed)
Chronic Check a1c Low sugar / carb diet Stressed regular exercise  

## 2023-05-31 NOTE — Assessment & Plan Note (Signed)
 Intermittent Wears brace at night

## 2023-06-01 ENCOUNTER — Ambulatory Visit (INDEPENDENT_AMBULATORY_CARE_PROVIDER_SITE_OTHER): Payer: 59 | Admitting: Obstetrics and Gynecology

## 2023-06-01 ENCOUNTER — Other Ambulatory Visit (INDEPENDENT_AMBULATORY_CARE_PROVIDER_SITE_OTHER)

## 2023-06-01 ENCOUNTER — Encounter: Payer: Self-pay | Admitting: Internal Medicine

## 2023-06-01 ENCOUNTER — Encounter: Payer: Self-pay | Admitting: Obstetrics and Gynecology

## 2023-06-01 VITALS — BP 112/64 | HR 84 | Temp 98.0°F | Ht 66.5 in | Wt 144.0 lb

## 2023-06-01 DIAGNOSIS — Z01419 Encounter for gynecological examination (general) (routine) without abnormal findings: Secondary | ICD-10-CM | POA: Insufficient documentation

## 2023-06-01 DIAGNOSIS — R739 Hyperglycemia, unspecified: Secondary | ICD-10-CM | POA: Diagnosis not present

## 2023-06-01 DIAGNOSIS — E559 Vitamin D deficiency, unspecified: Secondary | ICD-10-CM | POA: Diagnosis not present

## 2023-06-01 DIAGNOSIS — E782 Mixed hyperlipidemia: Secondary | ICD-10-CM | POA: Diagnosis not present

## 2023-06-01 DIAGNOSIS — M85852 Other specified disorders of bone density and structure, left thigh: Secondary | ICD-10-CM

## 2023-06-01 DIAGNOSIS — M85851 Other specified disorders of bone density and structure, right thigh: Secondary | ICD-10-CM | POA: Diagnosis not present

## 2023-06-01 LAB — VITAMIN D 25 HYDROXY (VIT D DEFICIENCY, FRACTURES): VITD: 54.74 ng/mL (ref 30.00–100.00)

## 2023-06-01 LAB — LIPID PANEL
Cholesterol: 235 mg/dL — ABNORMAL HIGH (ref 0–200)
HDL: 70.2 mg/dL (ref >39.00)
LDL Cholesterol: 149 mg/dL — ABNORMAL HIGH (ref 0–99)
NonHDL: 164.47
Total CHOL/HDL Ratio: 3
Triglycerides: 79 mg/dL (ref 0.0–149.0)
VLDL: 15.8 mg/dL (ref 0.0–40.0)

## 2023-06-01 LAB — CBC WITH DIFFERENTIAL/PLATELET
Basophils Absolute: 0.1 10*3/uL (ref 0.0–0.1)
Basophils Relative: 0.9 % (ref 0.0–3.0)
Eosinophils Absolute: 0.2 10*3/uL (ref 0.0–0.7)
Eosinophils Relative: 1.9 % (ref 0.0–5.0)
HCT: 40 % (ref 36.0–46.0)
Hemoglobin: 13.3 g/dL (ref 12.0–15.0)
Lymphocytes Relative: 18 % (ref 12.0–46.0)
Lymphs Abs: 1.5 10*3/uL (ref 0.7–4.0)
MCHC: 33.2 g/dL (ref 30.0–36.0)
MCV: 91.2 fl (ref 78.0–100.0)
Monocytes Absolute: 0.5 10*3/uL (ref 0.1–1.0)
Monocytes Relative: 5.6 % (ref 3.0–12.0)
Neutro Abs: 6.1 10*3/uL (ref 1.4–7.7)
Neutrophils Relative %: 73.6 % (ref 43.0–77.0)
Platelets: 255 10*3/uL (ref 150.0–400.0)
RBC: 4.38 Mil/uL (ref 3.87–5.11)
RDW: 14.3 % (ref 11.5–15.5)
WBC: 8.2 10*3/uL (ref 4.0–10.5)

## 2023-06-01 LAB — COMPREHENSIVE METABOLIC PANEL WITH GFR
ALT: 19 U/L (ref 0–35)
AST: 21 U/L (ref 0–37)
Albumin: 4.2 g/dL (ref 3.5–5.2)
Alkaline Phosphatase: 27 U/L — ABNORMAL LOW (ref 39–117)
BUN: 17 mg/dL (ref 6–23)
CO2: 30 meq/L (ref 19–32)
Calcium: 9.4 mg/dL (ref 8.4–10.5)
Chloride: 100 meq/L (ref 96–112)
Creatinine, Ser: 0.93 mg/dL (ref 0.40–1.20)
GFR: 65.08 mL/min (ref >60.00)
Glucose, Bld: 97 mg/dL (ref 70–99)
Potassium: 4.7 meq/L (ref 3.5–5.1)
Sodium: 136 meq/L (ref 135–145)
Total Bilirubin: 0.5 mg/dL (ref 0.2–1.2)
Total Protein: 7.1 g/dL (ref 6.0–8.3)

## 2023-06-01 LAB — TSH: TSH: 3.51 u[IU]/mL (ref 0.35–5.50)

## 2023-06-01 LAB — HEMOGLOBIN A1C: Hgb A1c MFr Bld: 5.6 % (ref 4.6–6.5)

## 2023-06-01 NOTE — Patient Instructions (Signed)

## 2023-06-01 NOTE — Assessment & Plan Note (Signed)
 Cervical cancer screening performed according to ASCCP guidelines. Declines further PAPSs. Encouraged annual mammogram screening Colonoscopy UTD DXA due Nov 2025 Labs and immunizations with her primary Encouraged safe sexual practices as indicated Encouraged healthy lifestyle practices with diet and exercise For patients under 50-64yo, I recommend 1200mg  calcium daily and 600IU of vitamin D daily.

## 2023-06-01 NOTE — Progress Notes (Signed)
 63 y.o. G3P0003 postmenopausal female with osteopenia here for annual exam. Married.  Travels back home to [place] area a lot.  Notes mild urinary leakage when bladder is very full.  No other symptoms.  Postmenopausal bleeding: None Pelvic discharge or pain: None Breast mass, nipple discharge or skin changes : None Last PAP:     Component Value Date/Time   DIAGPAP  05/30/2022 1404    - Negative for intraepithelial lesion or malignancy (NILM)   ADEQPAP  05/30/2022 1404    Satisfactory for evaluation. The presence or absence of an   ADEQPAP  05/30/2022 1404    endocervical/transformation zone component cannot be determined because   ADEQPAP of atrophy. 05/30/2022 1404   Last mammogram: 12/07/2022, BI-RADS 1 density b Last colonoscopy: 12/06/2021, every 5 years Last DXA: 01/05/2022, osteopenia Sexually active: No, partner has ED Exercising: ym/ health club routine includes weight training. 3x/wk  GYN HISTORY: No significant history  OB History  Gravida Para Term Preterm AB Living  3 3   0 3  SAB IAB Ectopic Multiple Live Births    0      # Outcome Date GA Lbr Len/2nd Weight Sex Type Anes PTL Lv  3 Para           2 Para           1 Para             Past Medical History:  Diagnosis Date   Arthritis    Dizziness    Hyperlipidemia    denies   Tubular adenoma of colon 03/30/09   Dr Jarold Motto    Past Surgical History:  Procedure Laterality Date   APPENDECTOMY     with 1st pregnancy   BRAVO PH STUDY N/A 11/30/2017   Procedure: BRAVO PH STUDY;  Surgeon: Rachael Fee, MD;  Location: [facility];  Service: Endoscopy;  Laterality: N/A;   BREAST BIOPSY     needle aspiration X1; resection X 1   colonoscopy with polypectomy  03/30/2009   Dr Jarold Motto   CT SCAN  2017   Chest due to a possible blood clot but came back normal   ESOPHAGOGASTRODUODENOSCOPY (EGD) WITH PROPOFOL N/A 11/30/2017   Procedure: ESOPHAGOGASTRODUODENOSCOPY (EGD) WITH PROPOFOL;  Surgeon: Rachael Fee, MD;  Location: [facility];  Service: Endoscopy;  Laterality: N/A;  48 hour Ph probe   frozen shoulder Left    broke the cartilage-was put to sleep Dr. Norlene Campbell apox 2014   G 3 P 3     3 C sections   TUBAL LIGATION      Current Outpatient Medications on File Prior to Visit  Medication Sig Dispense Refill   Apoaequorin (PREVAGEN) 10 MG CAPS      BLACK CURRANT SEED OIL PO Take by mouth.     Cyanocobalamin 1000 MCG CAPS Take by mouth.     Flaxseed, Linseed, (FLAXSEED OIL PO) Take by mouth.     IBUPROFEN PO Take by mouth.     Magnesium 400 MG CAPS Take by mouth.     Melatonin 10 MG CHEW Chew by mouth.     Multiple Vitamin (MULTIVITAMIN ADULT PO) Take by mouth.     pseudoephedrine (SUDAFED) 30 MG tablet Take 30 mg by mouth every 4 (four) hours as needed for congestion.     No current facility-administered medications on file prior to visit.    Social History   Socioeconomic History   Marital status: Married    Spouse  name: Not on file   Number of children: Not on file   Years of education: Not on file   Highest education level: Not on file  Occupational History   Not on file  Tobacco Use   Smoking status: Never   Smokeless tobacco: Never  Vaping Use   Vaping status: Never Used  Substance and Sexual Activity   Alcohol use: No   Drug use: No   Sexual activity: Not Currently    Partners: Male    Birth control/protection: Post-menopausal    Comment: 1st intercourse- 30, partners- 2, married- 19 yrs   Other Topics Concern   Not on file  Social History Narrative   Exercises regularly   Social Drivers of Health   Financial Resource Strain: Not on file  Food Insecurity: Not on file  Transportation Needs: Not on file  Physical Activity: Not on file  Stress: Not on file  Social Connections: Not on file  Intimate Partner Violence: Not on file    Family History  Problem Relation Age of Onset   Colon cancer Mother    Cancer - Colon Mother    Heart  disease Father    Hyperlipidemia Father    Stroke Maternal Aunt        ? . one M aunt;> 65   Colon cancer Maternal Aunt    Colon cancer Maternal Aunt    Breast cancer Paternal Aunt    Diabetes Paternal Aunt    Esophageal cancer Neg Hx    Stomach cancer Neg Hx    Rectal cancer Neg Hx     No Known Allergies    PE Today's Vitals   06/01/23 1130  BP: 112/64  Pulse: 84  Temp: 98 F (36.7 C)  TempSrc: Oral  SpO2: 98%  Weight: 144 lb (65.3 kg)  Height: 5' 6.5" (1.689 m)   Body mass index is 22.89 kg/m.  Physical Exam Vitals reviewed. Exam conducted with a chaperone present.  Constitutional:      General: She is not in acute distress.    Appearance: Normal appearance.  HENT:     Head: Normocephalic and atraumatic.     Nose: Nose normal.  Eyes:     Extraocular Movements: Extraocular movements intact.     Conjunctiva/sclera: Conjunctivae normal.  Neck:     Thyroid: No thyroid mass, thyromegaly or thyroid tenderness.  Pulmonary:     Effort: Pulmonary effort is normal.  Chest:     Chest wall: No mass or tenderness.  Breasts:    Right: Normal. No swelling, mass, nipple discharge, skin change or tenderness.     Left: Normal. No swelling, mass, nipple discharge, skin change or tenderness.     Comments: L lumpectomy scar Abdominal:     General: There is no distension.     Palpations: Abdomen is soft.     Tenderness: There is no abdominal tenderness.  Genitourinary:    General: Normal vulva.     Exam position: Lithotomy position.     Urethra: No prolapse.     Vagina: Normal. No vaginal discharge or bleeding.     Cervix: Normal. No lesion.     Uterus: Normal. Not enlarged and not tender.      Adnexa: Right adnexa normal and left adnexa normal.  Musculoskeletal:        General: Normal range of motion.     Cervical back: Normal range of motion.  Lymphadenopathy:     Upper Body:     Right upper  body: No axillary adenopathy.     Left upper body: No axillary adenopathy.      Lower Body: No right inguinal adenopathy. No left inguinal adenopathy.  Skin:    General: Skin is warm and dry.  Neurological:     General: No focal deficit present.     Mental Status: She is alert.  Psychiatric:        Mood and Affect: Mood normal.        Behavior: Behavior normal.      Assessment and Plan:        Well woman exam with routine gynecological exam Assessment & Plan: Cervical cancer screening performed according to ASCCP guidelines. Declines further PAPSs. Encouraged annual mammogram screening Colonoscopy UTD DXA due Nov 2025 Labs and immunizations with her primary Encouraged safe sexual practices as indicated Encouraged healthy lifestyle practices with diet and exercise For patients under 50-70yo, I recommend 1200mg  calcium daily and 600IU of vitamin D daily.    Osteopenia of necks of both femurs -     DG Bone Density; Future  Continue vitamin D+Calcium Encouraged weight based exercise   Rosalyn Gess, MD

## 2023-06-09 ENCOUNTER — Encounter: Payer: Self-pay | Admitting: Internal Medicine

## 2023-07-12 DIAGNOSIS — M9904 Segmental and somatic dysfunction of sacral region: Secondary | ICD-10-CM | POA: Diagnosis not present

## 2023-07-12 DIAGNOSIS — M461 Sacroiliitis, not elsewhere classified: Secondary | ICD-10-CM | POA: Diagnosis not present

## 2023-07-12 DIAGNOSIS — M9902 Segmental and somatic dysfunction of thoracic region: Secondary | ICD-10-CM | POA: Diagnosis not present

## 2023-07-12 DIAGNOSIS — M9901 Segmental and somatic dysfunction of cervical region: Secondary | ICD-10-CM | POA: Diagnosis not present

## 2023-07-12 DIAGNOSIS — M542 Cervicalgia: Secondary | ICD-10-CM | POA: Diagnosis not present

## 2023-07-12 DIAGNOSIS — M546 Pain in thoracic spine: Secondary | ICD-10-CM | POA: Diagnosis not present

## 2023-07-31 DIAGNOSIS — M461 Sacroiliitis, not elsewhere classified: Secondary | ICD-10-CM | POA: Diagnosis not present

## 2023-07-31 DIAGNOSIS — M9903 Segmental and somatic dysfunction of lumbar region: Secondary | ICD-10-CM | POA: Diagnosis not present

## 2023-07-31 DIAGNOSIS — M5412 Radiculopathy, cervical region: Secondary | ICD-10-CM | POA: Diagnosis not present

## 2023-07-31 DIAGNOSIS — M9904 Segmental and somatic dysfunction of sacral region: Secondary | ICD-10-CM | POA: Diagnosis not present

## 2023-07-31 DIAGNOSIS — M5459 Other low back pain: Secondary | ICD-10-CM | POA: Diagnosis not present

## 2023-07-31 DIAGNOSIS — M9901 Segmental and somatic dysfunction of cervical region: Secondary | ICD-10-CM | POA: Diagnosis not present

## 2023-08-07 DIAGNOSIS — M5459 Other low back pain: Secondary | ICD-10-CM | POA: Diagnosis not present

## 2023-08-07 DIAGNOSIS — M9901 Segmental and somatic dysfunction of cervical region: Secondary | ICD-10-CM | POA: Diagnosis not present

## 2023-08-07 DIAGNOSIS — M9902 Segmental and somatic dysfunction of thoracic region: Secondary | ICD-10-CM | POA: Diagnosis not present

## 2023-08-07 DIAGNOSIS — M5413 Radiculopathy, cervicothoracic region: Secondary | ICD-10-CM | POA: Diagnosis not present

## 2023-08-07 DIAGNOSIS — M9903 Segmental and somatic dysfunction of lumbar region: Secondary | ICD-10-CM | POA: Diagnosis not present

## 2023-08-07 DIAGNOSIS — M542 Cervicalgia: Secondary | ICD-10-CM | POA: Diagnosis not present

## 2023-08-21 DIAGNOSIS — M5459 Other low back pain: Secondary | ICD-10-CM | POA: Diagnosis not present

## 2023-08-21 DIAGNOSIS — M546 Pain in thoracic spine: Secondary | ICD-10-CM | POA: Diagnosis not present

## 2023-08-21 DIAGNOSIS — M9901 Segmental and somatic dysfunction of cervical region: Secondary | ICD-10-CM | POA: Diagnosis not present

## 2023-08-21 DIAGNOSIS — M9902 Segmental and somatic dysfunction of thoracic region: Secondary | ICD-10-CM | POA: Diagnosis not present

## 2023-08-21 DIAGNOSIS — M542 Cervicalgia: Secondary | ICD-10-CM | POA: Diagnosis not present

## 2023-08-21 DIAGNOSIS — M9903 Segmental and somatic dysfunction of lumbar region: Secondary | ICD-10-CM | POA: Diagnosis not present

## 2023-10-02 DIAGNOSIS — M5412 Radiculopathy, cervical region: Secondary | ICD-10-CM | POA: Diagnosis not present

## 2023-10-02 DIAGNOSIS — M9902 Segmental and somatic dysfunction of thoracic region: Secondary | ICD-10-CM | POA: Diagnosis not present

## 2023-10-02 DIAGNOSIS — M546 Pain in thoracic spine: Secondary | ICD-10-CM | POA: Diagnosis not present

## 2023-10-02 DIAGNOSIS — M9903 Segmental and somatic dysfunction of lumbar region: Secondary | ICD-10-CM | POA: Diagnosis not present

## 2023-10-02 DIAGNOSIS — M5416 Radiculopathy, lumbar region: Secondary | ICD-10-CM | POA: Diagnosis not present

## 2023-10-02 DIAGNOSIS — M9901 Segmental and somatic dysfunction of cervical region: Secondary | ICD-10-CM | POA: Diagnosis not present

## 2023-10-12 DIAGNOSIS — M9902 Segmental and somatic dysfunction of thoracic region: Secondary | ICD-10-CM | POA: Diagnosis not present

## 2023-10-12 DIAGNOSIS — M5412 Radiculopathy, cervical region: Secondary | ICD-10-CM | POA: Diagnosis not present

## 2023-10-12 DIAGNOSIS — M9903 Segmental and somatic dysfunction of lumbar region: Secondary | ICD-10-CM | POA: Diagnosis not present

## 2023-10-12 DIAGNOSIS — M9901 Segmental and somatic dysfunction of cervical region: Secondary | ICD-10-CM | POA: Diagnosis not present

## 2023-10-12 DIAGNOSIS — M546 Pain in thoracic spine: Secondary | ICD-10-CM | POA: Diagnosis not present

## 2023-10-12 DIAGNOSIS — M5416 Radiculopathy, lumbar region: Secondary | ICD-10-CM | POA: Diagnosis not present

## 2023-10-18 DIAGNOSIS — M5416 Radiculopathy, lumbar region: Secondary | ICD-10-CM | POA: Diagnosis not present

## 2023-10-18 DIAGNOSIS — M546 Pain in thoracic spine: Secondary | ICD-10-CM | POA: Diagnosis not present

## 2023-10-18 DIAGNOSIS — M9901 Segmental and somatic dysfunction of cervical region: Secondary | ICD-10-CM | POA: Diagnosis not present

## 2023-10-18 DIAGNOSIS — M5412 Radiculopathy, cervical region: Secondary | ICD-10-CM | POA: Diagnosis not present

## 2023-10-18 DIAGNOSIS — M9903 Segmental and somatic dysfunction of lumbar region: Secondary | ICD-10-CM | POA: Diagnosis not present

## 2023-10-18 DIAGNOSIS — M9902 Segmental and somatic dysfunction of thoracic region: Secondary | ICD-10-CM | POA: Diagnosis not present

## 2023-10-25 ENCOUNTER — Encounter: Payer: Self-pay | Admitting: Obstetrics and Gynecology

## 2023-10-25 DIAGNOSIS — Z1231 Encounter for screening mammogram for malignant neoplasm of breast: Secondary | ICD-10-CM

## 2023-11-06 ENCOUNTER — Other Ambulatory Visit: Payer: Self-pay | Admitting: Obstetrics and Gynecology

## 2023-11-06 DIAGNOSIS — Z1231 Encounter for screening mammogram for malignant neoplasm of breast: Secondary | ICD-10-CM

## 2023-11-27 DIAGNOSIS — M9901 Segmental and somatic dysfunction of cervical region: Secondary | ICD-10-CM | POA: Diagnosis not present

## 2023-11-27 DIAGNOSIS — M5416 Radiculopathy, lumbar region: Secondary | ICD-10-CM | POA: Diagnosis not present

## 2023-11-27 DIAGNOSIS — M9902 Segmental and somatic dysfunction of thoracic region: Secondary | ICD-10-CM | POA: Diagnosis not present

## 2023-11-27 DIAGNOSIS — M25572 Pain in left ankle and joints of left foot: Secondary | ICD-10-CM | POA: Diagnosis not present

## 2023-11-27 DIAGNOSIS — M9903 Segmental and somatic dysfunction of lumbar region: Secondary | ICD-10-CM | POA: Diagnosis not present

## 2023-11-27 DIAGNOSIS — M5412 Radiculopathy, cervical region: Secondary | ICD-10-CM | POA: Diagnosis not present

## 2023-11-27 DIAGNOSIS — M546 Pain in thoracic spine: Secondary | ICD-10-CM | POA: Diagnosis not present

## 2023-12-08 ENCOUNTER — Ambulatory Visit
Admission: RE | Admit: 2023-12-08 | Discharge: 2023-12-08 | Disposition: A | Discharge status: Home / Self Care | Source: Ambulatory Visit | Attending: Obstetrics and Gynecology | Admitting: Obstetrics and Gynecology

## 2023-12-08 DIAGNOSIS — Z1231 Encounter for screening mammogram for malignant neoplasm of breast: Secondary | ICD-10-CM | POA: Diagnosis not present

## 2023-12-12 ENCOUNTER — Ambulatory Visit: Payer: Self-pay | Admitting: Obstetrics and Gynecology

## 2024-01-03 DIAGNOSIS — M791 Myalgia, unspecified site: Secondary | ICD-10-CM | POA: Diagnosis not present

## 2024-01-03 DIAGNOSIS — R059 Cough, unspecified: Secondary | ICD-10-CM | POA: Diagnosis not present

## 2024-01-03 DIAGNOSIS — R5383 Other fatigue: Secondary | ICD-10-CM | POA: Diagnosis not present

## 2024-01-08 ENCOUNTER — Encounter: Payer: Self-pay | Admitting: Internal Medicine

## 2024-01-08 ENCOUNTER — Ambulatory Visit (HOSPITAL_BASED_OUTPATIENT_CLINIC_OR_DEPARTMENT_OTHER)
Admission: RE | Admit: 2024-01-08 | Discharge: 2024-01-08 | Disposition: A | Discharge status: Home / Self Care | Source: Ambulatory Visit | Attending: Obstetrics and Gynecology | Admitting: Obstetrics and Gynecology

## 2024-01-08 ENCOUNTER — Ambulatory Visit: Payer: Self-pay | Admitting: Obstetrics and Gynecology

## 2024-01-08 DIAGNOSIS — M85852 Other specified disorders of bone density and structure, left thigh: Secondary | ICD-10-CM | POA: Diagnosis not present

## 2024-01-08 DIAGNOSIS — M85851 Other specified disorders of bone density and structure, right thigh: Secondary | ICD-10-CM | POA: Insufficient documentation

## 2024-01-08 NOTE — Progress Notes (Unsigned)
    Subjective:    Patient ID: Jean Pittman [place], female    DOB: 1960/08/28, 63 y.o.   MRN: [id number]      HPI Sherril is here for  Chief Complaint  Patient presents with   Cough   Hernia         Medications and allergies reviewed with patient and updated if appropriate.  Current Outpatient Medications on File Prior to Visit  Medication Sig Dispense Refill   Apoaequorin (PREVAGEN) 10 MG CAPS      BLACK CURRANT SEED OIL PO Take by mouth.     Cyanocobalamin 1000 MCG CAPS Take by mouth.     Flaxseed, Linseed, (FLAXSEED OIL PO) Take by mouth.     IBUPROFEN PO Take by mouth.     Magnesium  400 MG CAPS Take by mouth.     Melatonin 10 MG CHEW Chew by mouth.     Multiple Vitamin (MULTIVITAMIN ADULT PO) Take by mouth.     pseudoephedrine (SUDAFED) 30 MG tablet Take 30 mg by mouth every 4 (four) hours as needed for congestion.     No current facility-administered medications on file prior to visit.    Review of Systems     Objective:  There were no vitals filed for this visit. BP Readings from Last 3 Encounters:  06/01/23 112/64  05/31/23 116/76  11/30/22 124/88   Wt Readings from Last 3 Encounters:  06/01/23 144 lb (65.3 kg)  05/31/23 145 lb (65.8 kg)  11/30/22 142 lb 3.2 oz (64.5 kg)   There is no height or weight on file to calculate BMI.    Physical Exam         Assessment & Plan:    See Problem List for Assessment and Plan of chronic medical problems.

## 2024-01-09 ENCOUNTER — Ambulatory Visit (INDEPENDENT_AMBULATORY_CARE_PROVIDER_SITE_OTHER): Admitting: Internal Medicine

## 2024-01-09 ENCOUNTER — Ambulatory Visit: Payer: Self-pay | Admitting: Internal Medicine

## 2024-01-09 ENCOUNTER — Ambulatory Visit (INDEPENDENT_AMBULATORY_CARE_PROVIDER_SITE_OTHER)

## 2024-01-09 VITALS — BP 122/60 | HR 81 | Temp 98.3°F | Ht 66.5 in | Wt 152.0 lb

## 2024-01-09 DIAGNOSIS — J45909 Unspecified asthma, uncomplicated: Secondary | ICD-10-CM | POA: Insufficient documentation

## 2024-01-09 DIAGNOSIS — K429 Umbilical hernia without obstruction or gangrene: Secondary | ICD-10-CM | POA: Insufficient documentation

## 2024-01-09 DIAGNOSIS — J209 Acute bronchitis, unspecified: Secondary | ICD-10-CM | POA: Diagnosis not present

## 2024-01-09 DIAGNOSIS — J22 Unspecified acute lower respiratory infection: Secondary | ICD-10-CM

## 2024-01-09 DIAGNOSIS — R059 Cough, unspecified: Secondary | ICD-10-CM | POA: Diagnosis not present

## 2024-01-09 DIAGNOSIS — J4521 Mild intermittent asthma with (acute) exacerbation: Secondary | ICD-10-CM | POA: Diagnosis not present

## 2024-01-09 MED ORDER — METHYLPREDNISOLONE ACETATE 80 MG/ML IJ SUSP
80.0000 mg | Freq: Once | INTRAMUSCULAR | Status: AC
  Administered 2024-01-09: 80 mg via INTRAMUSCULAR

## 2024-01-09 MED ORDER — AZITHROMYCIN 250 MG PO TABS: ORAL_TABLET | ORAL | 0 refills | Status: DC

## 2024-01-09 NOTE — Patient Instructions (Addendum)
     Chest xray downstairs   A steroid injection was given    Medications changes include :  zpak     Return if symptoms worsen or fail to improve.

## 2024-01-09 NOTE — Addendum Note (Signed)
 Addended by: CLAUDENE TOBIAS PARAS on: 01/09/2024 01:30 PM   Modules accepted: Orders

## 2024-01-09 NOTE — Assessment & Plan Note (Signed)
 Chronic Small hernia present Typically has no pain or soreness Recently has been coughing a lot and has had a little bit of soreness-mild in nature Reassured-nothing needs to be done-continue to monitor If pain increases would need to consider surgery.

## 2024-01-09 NOTE — Assessment & Plan Note (Signed)
 Acute Having increased cough and some SOB that is likely RAD to her bronchitis Depo-medrol  80 mg IM x 1 Zpak, complete doxycycline  CXR today

## 2024-01-09 NOTE — Assessment & Plan Note (Addendum)
 Acute With reactive airway disease Diagnosed at [facility] - on doxycycline Symptoms worse today CXR today Start zpak Complete doxycycline  Depo-medrol  80 mg IM - for RAD - I think that is a component of her symptoms

## 2024-01-10 DIAGNOSIS — L570 Actinic keratosis: Secondary | ICD-10-CM | POA: Diagnosis not present

## 2024-01-10 DIAGNOSIS — L821 Other seborrheic keratosis: Secondary | ICD-10-CM | POA: Diagnosis not present

## 2024-01-10 DIAGNOSIS — L814 Other melanin hyperpigmentation: Secondary | ICD-10-CM | POA: Diagnosis not present

## 2024-01-10 DIAGNOSIS — L84 Corns and callosities: Secondary | ICD-10-CM | POA: Diagnosis not present

## 2024-01-10 DIAGNOSIS — Z85828 Personal history of other malignant neoplasm of skin: Secondary | ICD-10-CM | POA: Diagnosis not present

## 2024-01-10 DIAGNOSIS — D692 Other nonthrombocytopenic purpura: Secondary | ICD-10-CM | POA: Diagnosis not present

## 2024-01-10 DIAGNOSIS — D1801 Hemangioma of skin and subcutaneous tissue: Secondary | ICD-10-CM | POA: Diagnosis not present

## 2024-01-12 ENCOUNTER — Encounter: Payer: Self-pay | Admitting: Internal Medicine

## 2024-02-07 ENCOUNTER — Ambulatory Visit: Payer: Self-pay

## 2024-02-07 NOTE — Telephone Encounter (Signed)
 FYI Only or Action Required?: FYI only for provider: appointment scheduled on 02/14/24.  Patient was last seen in primary care on 01/09/2024 by Geofm Glade PARAS, MD.  Called Nurse Triage reporting Appointment.  Symptoms began about a month ago.  Interventions attempted: OTC medications: sudafed.  Symptoms are: unchanged.  Triage Disposition: See PCP When Office is Open (Within 3 Days)  Patient/caregiver understands and will follow disposition?: Yes  Copied from CRM #[id number]. Topic: Clinical - Red Word Triage >> Feb 07, 2024 12:35 PM Carlyon D wrote: Red Word that prompted transfer to Nurse Triage: Shortness of breath and extremely tired been happening since 11/11. Reason for Disposition  Caller requesting an appointment, triage offered and declined  Answer Assessment - Initial Assessment Questions 1. REASON FOR CALL or QUESTION: What is your reason for calling today? or How can I best    Pt messaging with provider in My Chart, reported that fatigue is improved as she has stopped napping throughout the day. Pt states she is only taking sudafed daily but that he SOB is not getting better. Discussed appt availability, pt elects to wait until 11/17 to see provider. Appointment scheduled for evaluation. Patient agrees with plan of care, and will call back if anything changes, or if symptoms worsen.  Protocols used: PCP Call - No Triage-A-AH

## 2024-02-14 ENCOUNTER — Encounter: Payer: Self-pay | Admitting: Internal Medicine

## 2024-02-14 ENCOUNTER — Ambulatory Visit: Admitting: Internal Medicine

## 2024-02-14 VITALS — BP 118/74 | HR 67 | Temp 98.7°F | Ht 66.0 in | Wt 145.0 lb

## 2024-02-14 DIAGNOSIS — J019 Acute sinusitis, unspecified: Secondary | ICD-10-CM | POA: Diagnosis not present

## 2024-02-14 DIAGNOSIS — J45901 Unspecified asthma with (acute) exacerbation: Secondary | ICD-10-CM | POA: Diagnosis not present

## 2024-02-14 MED ORDER — BUDESONIDE-FORMOTEROL FUMARATE 80-4.5 MCG/ACT IN AERO: 2.0000 | INHALATION_SPRAY | Freq: Two times a day (BID) | RESPIRATORY_TRACT | 3 refills | Status: AC | PRN

## 2024-02-14 MED ORDER — PREDNISONE 20 MG PO TABS: 40.0000 mg | ORAL_TABLET | Freq: Every day | ORAL | 0 refills | Status: AC

## 2024-02-14 MED ORDER — AMOXICILLIN-POT CLAVULANATE 875-125 MG PO TABS: 1.0000 | ORAL_TABLET | Freq: Two times a day (BID) | ORAL | 0 refills | Status: AC

## 2024-02-14 NOTE — Progress Notes (Signed)
 Subjective:    Patient ID: Jean Pittman, female    DOB: Jul 30, 1960, 63 y.o.   MRN: [id number]      HPI Jean Pittman is here for  Chief Complaint  Patient presents with   Acute Visit    Since November shortness of breath and fatigue experiencing dizziness and light headedness     I saw her 11/11 for cold symptoms.  She was on doxycycline.  She was experiencing shortness of breath which I felt was likely related to her reactive airway disease secondary to bronchitis.  We gave her Depo-Medrol  80 mg shot and started on Z-Pak.  Chest x-ray at that time was negative for infection.  Since that time she has remained short of breath and extremely tired.   Discussed the use of AI scribe software for clinical note transcription with the patient, who gave verbal consent to proceed.  History of Present Illness Jean Pittman is a 63 year old female who presents with persistent fatigue, cough, and shortness of breath.  She has been experiencing significant fatigue and persistent lack of energy since early November. Initially, she slept excessively but now feels exhausted performing daily activities. She experiences shortness of breath with minimal exertion, such as getting dressed or walking, and feels as though she has exercised all day despite minimal activity.  Her cough is intermittent, sometimes relentless, and is productive with light green phlegm. The cough is triggered by exposure to cold air or dust, and she uses cough medicine to manage it. She reports difficulty taking deep breaths and experiences dizziness, particularly when attempting deep breathing.  She experiences nasal congestion and drainage, particularly at night, which she manages with Sudafed. She takes two Sudafed in the morning and two at night to alleviate symptoms. Despite this, she feels constant mucus drainage in her throat, especially when lying down, which disrupts her sleep.  No fever, chills, headaches, wheezing,  chest pain, ear pain, sore throat, sinus pain, or pressure. She reports a decreased appetite but forces herself to eat. She occasionally takes Tylenol  or ibuprofen for unspecified pain.  Her current medications include Sudafed, Tessalon  Pearl cough drops, and occasional use of melatonin to aid sleep. She previously received a pneumonia vaccination and has been treated for bronchitis in the past, but notes that her current symptoms differ from previous episodes of bronchitis.     Medications and allergies reviewed with patient and updated if appropriate.  Medications Ordered Prior to Encounter[1]  Review of Systems  Constitutional:  Positive for appetite change (decreased) and fatigue. Negative for chills and fever.  HENT:  Positive for congestion, postnasal drip and sinus pressure (taking sudafed regularly). Negative for ear pain and sore throat.   Respiratory:  Positive for cough (improved - intermittent, wet cough - some phlegm clear or light clear) and shortness of breath (sitting and walking). Negative for wheezing.        Can not always get a deep breath  Cardiovascular:  Negative for chest pain.  Neurological:  Positive for dizziness and light-headedness. Negative for headaches.       Objective:   Vitals:   02/14/24 0935  BP: 118/74  Pulse: 67  Temp: 98.7 F (37.1 C)  SpO2: 99%   BP Readings from Last 3 Encounters:  02/14/24 118/74  01/09/24 122/60  06/01/23 112/64   Wt Readings from Last 3 Encounters:  02/14/24 145 lb (65.8 kg)  01/09/24 152 lb (68.9 kg)  06/01/23 144 lb (65.3 kg)   Body  mass index is 23.4 kg/m.    Physical Exam Constitutional:      General: She is not in acute distress.    Appearance: Normal appearance. She is not ill-appearing.  HENT:     Head: Normocephalic and atraumatic.     Right Ear: Tympanic membrane, ear canal and external ear normal.     Left Ear: Tympanic membrane, ear canal and external ear normal.     Mouth/Throat:     Mouth:  Mucous membranes are moist.     Pharynx: No oropharyngeal exudate or posterior oropharyngeal erythema.  Eyes:     Conjunctiva/sclera: Conjunctivae normal.  Cardiovascular:     Rate and Rhythm: Normal rate and regular rhythm.  Pulmonary:     Effort: Pulmonary effort is normal. No respiratory distress.     Breath sounds: Normal breath sounds. No wheezing or rales.  Musculoskeletal:     Cervical back: Neck supple. No tenderness.  Lymphadenopathy:     Cervical: No cervical adenopathy.  Skin:    General: Skin is warm and dry.  Neurological:     Mental Status: She is alert.            Assessment & Plan:    See Problem List for Assessment and Plan of chronic medical problems.         [1]  Current Outpatient Medications on File Prior to Visit  Medication Sig Dispense Refill   Apoaequorin (PREVAGEN) 10 MG CAPS      azithromycin  (ZITHROMAX ) 250 MG tablet Take two tabs the first day and then one tab daily for four days 6 tablet 0   benzonatate  (TESSALON ) 100 MG capsule Take by mouth.     BLACK CURRANT SEED OIL PO Take by mouth.     Cyanocobalamin 1000 MCG CAPS Take by mouth.     doxycycline (ADOXA) 100 MG tablet Take 100 mg by mouth 2 (two) times daily.     Flaxseed, Linseed, (FLAXSEED OIL PO) Take by mouth.     IBUPROFEN PO Take by mouth.     Magnesium  400 MG CAPS Take by mouth.     Melatonin 10 MG CHEW Chew by mouth.     Multiple Vitamin (MULTIVITAMIN ADULT PO) Take by mouth.     promethazine-dextromethorphan (PROMETHAZINE-DM) 6.25-15 MG/5ML syrup Take 5 mLs by mouth every 4 (four) hours as needed.     pseudoephedrine (SUDAFED) 30 MG tablet Take 30 mg by mouth every 4 (four) hours as needed for congestion.     No current facility-administered medications on file prior to visit.

## 2024-02-14 NOTE — Patient Instructions (Addendum)
° ° ° ° ° °  Medications changes include :   prednisone  x 5 days, augmentin , symbicort  inhaler to use as needed after finishing the antibiotic      Return if symptoms worsen or fail to improve.

## 2024-02-14 NOTE — Assessment & Plan Note (Signed)
 Subacute Has persistent sinus pressure and is taking Sudafed twice daily in addition to nasal congestion, PND, cough and asthma exacerbation Start Augmentin  875-125 mg twice daily x 1 week Hopefully this will result in decreased sinus pressure, nasal congestion, PND She will let me know if there is no improvement

## 2024-02-14 NOTE — Assessment & Plan Note (Signed)
 Acute History of asthma States that she typically only has issues when she is sick or the weather turns cold Has not used an inhaler in a regular basis in the past Some of her symptoms are likely related to her asthma, but there may be an element of anxiety as well although alprazolam  did not help Prednisone  40 mg daily x 5 days Symbicort  80-4.5 mcg per ACT 2 puffs twice daily as needed-this is to be used after she completes the prednisone  as needed.  Discussed she likely will only use this when she is sick or if the weather is cold She will update me on how she is feeling Can consider pulmonary referral if symptoms are not improving

## 2024-02-20 DIAGNOSIS — D72829 Elevated white blood cell count, unspecified: Secondary | ICD-10-CM | POA: Diagnosis not present

## 2024-02-20 DIAGNOSIS — J4520 Mild intermittent asthma, uncomplicated: Secondary | ICD-10-CM | POA: Diagnosis not present

## 2024-02-20 DIAGNOSIS — K429 Umbilical hernia without obstruction or gangrene: Secondary | ICD-10-CM | POA: Diagnosis not present

## 2024-02-20 DIAGNOSIS — R0609 Other forms of dyspnea: Secondary | ICD-10-CM | POA: Diagnosis not present

## 2024-06-04 ENCOUNTER — Ambulatory Visit: Admitting: Obstetrics and Gynecology
